# Patient Record
Sex: Female | Born: 1950 | Race: White | Hispanic: No | Smoking: Former smoker
Health system: Southern US, Community
[De-identification: ages and names within clinical notes are randomized; demographics above are authoritative.]

## PROBLEM LIST (undated history)

## (undated) DIAGNOSIS — E559 Vitamin D deficiency, unspecified: Secondary | ICD-10-CM

## (undated) DIAGNOSIS — K589 Irritable bowel syndrome without diarrhea: Secondary | ICD-10-CM

## (undated) DIAGNOSIS — F419 Anxiety disorder, unspecified: Secondary | ICD-10-CM

## (undated) DIAGNOSIS — E538 Deficiency of other specified B group vitamins: Secondary | ICD-10-CM

## (undated) DIAGNOSIS — E7800 Pure hypercholesterolemia, unspecified: Secondary | ICD-10-CM

## (undated) DIAGNOSIS — Z8249 Family history of ischemic heart disease and other diseases of the circulatory system: Secondary | ICD-10-CM

## (undated) DIAGNOSIS — I10 Essential (primary) hypertension: Secondary | ICD-10-CM

## (undated) HISTORY — DX: Anxiety disorder, unspecified: F41.9

## (undated) HISTORY — PX: AUGMENTATION MAMMAPLASTY: SUR837

## (undated) HISTORY — DX: Deficiency of other specified B group vitamins: E53.8

## (undated) HISTORY — DX: Essential (primary) hypertension: I10

## (undated) HISTORY — DX: Irritable bowel syndrome, unspecified: K58.9

## (undated) HISTORY — PX: TUBAL LIGATION: SHX77

## (undated) HISTORY — DX: Vitamin D deficiency, unspecified: E55.9

---
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
## 2000-03-10 ENCOUNTER — Emergency Department (HOSPITAL_COMMUNITY): Admission: EM | Admit: 2000-03-10 | Discharge: 2000-03-10 | Payer: Self-pay | Admitting: *Deleted

## 2000-03-15 ENCOUNTER — Other Ambulatory Visit: Admission: RE | Admit: 2000-03-15 | Discharge: 2000-03-15 | Payer: Self-pay | Admitting: Gastroenterology

## 2000-03-15 ENCOUNTER — Encounter (INDEPENDENT_AMBULATORY_CARE_PROVIDER_SITE_OTHER): Payer: Self-pay | Admitting: Specialist

## 2000-04-20 ENCOUNTER — Other Ambulatory Visit: Admission: RE | Admit: 2000-04-20 | Discharge: 2000-04-20 | Payer: Self-pay | Admitting: Internal Medicine

## 2001-06-29 ENCOUNTER — Other Ambulatory Visit: Admission: RE | Admit: 2001-06-29 | Discharge: 2001-06-29 | Payer: Self-pay | Admitting: Obstetrics & Gynecology

## 2002-04-08 ENCOUNTER — Encounter: Admission: RE | Admit: 2002-04-08 | Discharge: 2002-04-08 | Payer: Self-pay | Admitting: Occupational Medicine

## 2002-04-08 ENCOUNTER — Encounter: Payer: Self-pay | Admitting: Occupational Medicine

## 2004-08-25 ENCOUNTER — Ambulatory Visit (HOSPITAL_COMMUNITY): Admission: RE | Admit: 2004-08-25 | Discharge: 2004-08-25 | Payer: Self-pay | Admitting: Internal Medicine

## 2009-03-05 LAB — HM COLONOSCOPY

## 2009-05-24 ENCOUNTER — Emergency Department (HOSPITAL_COMMUNITY): Admission: EM | Admit: 2009-05-24 | Discharge: 2009-05-24 | Payer: Self-pay | Admitting: Emergency Medicine

## 2009-06-01 ENCOUNTER — Telehealth (INDEPENDENT_AMBULATORY_CARE_PROVIDER_SITE_OTHER): Payer: Self-pay | Admitting: *Deleted

## 2009-06-02 ENCOUNTER — Ambulatory Visit: Payer: Self-pay

## 2009-06-02 ENCOUNTER — Encounter: Payer: Self-pay | Admitting: Cardiology

## 2010-01-12 ENCOUNTER — Encounter (INDEPENDENT_AMBULATORY_CARE_PROVIDER_SITE_OTHER): Payer: Self-pay | Admitting: *Deleted

## 2010-03-17 ENCOUNTER — Encounter (INDEPENDENT_AMBULATORY_CARE_PROVIDER_SITE_OTHER): Payer: Self-pay

## 2010-03-19 ENCOUNTER — Ambulatory Visit: Payer: Self-pay | Admitting: Gastroenterology

## 2010-03-30 ENCOUNTER — Ambulatory Visit: Payer: Self-pay | Admitting: Gastroenterology

## 2010-03-30 HISTORY — PX: COLONOSCOPY: SHX174

## 2010-04-01 ENCOUNTER — Encounter: Payer: Self-pay | Admitting: Gastroenterology

## 2010-10-05 NOTE — Letter (Signed)
Summary: Previsit letter  [facility]  [address] [place], [place] [address]   Phone: [phone]  Fax: [phone]       01/12/2010 MRN: [id number]  Brittany Hanna [address] [place], [place]  [address]  [name],  Welcome to the Gastroenterology Division at [facility].    You are scheduled to see a nurse for your pre-procedure visit on 02-26-10 at 4:30a.m. on the 3rd floor at [facility], [address]  We ask that you try to arrive at our office 15 minutes prior to your appointment time to allow for check-in.  Your nurse visit will consist of discussing your medical and surgical history, your immediate family medical history, and your medications.    Please bring a complete list of all your medications or, if you prefer, bring the medication bottles and we will list them.  We will need to be aware of both prescribed and over the counter drugs.  We will need to know exact dosage information as well.  If you are on blood thinners (Coumadin, Plavix, Aggrenox, Ticlid, etc.) please call our office today/prior to your appointment, as we need to consult with your physician about holding your medication.   Please be prepared to read and sign documents such as consent forms, a financial agreement, and acknowledgement forms.  If necessary, and with your consent, a friend or relative is welcome to sit-in on the nurse visit with you.  Please bring your insurance card so that we may make a copy of it.  If your insurance requires a referral to see a specialist, please bring your referral form from your primary care physician.  No co-pay is required for this nurse visit.     If you cannot keep your appointment, please call [phone] to cancel or reschedule prior to your appointment date.  This allows [place] the opportunity to schedule an appointment for another patient in need of care.    Thank you for choosing [facility] for your medical  needs.  We appreciate the opportunity to care for you.  Please visit [place] at our website  to learn more about our practice.                     Sincerely.                                                                                                                   The Gastroenterology Division

## 2010-10-05 NOTE — Letter (Signed)
Summary: Patient Notice-Hyperplastic Polyps  [facility]  [address] [place], [place] [address]   Phone: [phone]  Fax: [phone]        April 01, 2010 MRN: [id number]    [facility] [address] [place], [place]  [address]    [name],  I am pleased to inform you that the colon polyp(s) removed during your recent colonoscopy was (were) found to be hyperplastic. These types of polyps are NOT pre-cancerous.  It is my recommendation that you have a repeat colonoscopy examination in 10 years for routine colorectal cancer screening.  Should you develop new or worsening symptoms of abdominal pain, bowel habit changes or bleeding from the rectum or bowels, please schedule an evaluation with either your primary care physician or with me.  Continue treatment plan as outlined the day of your exam.  Please call us if you are having persistent problems or have questions about your condition that have not been fully answered at this time.  Sincerely,  Meryl Dare MD [facility]  This letter has been electronically signed by your physician.  Appended Document: Patient Notice-Hyperplastic Polyps letter mailed 8.1.2011

## 2010-10-05 NOTE — Letter (Signed)
Summary: [facility] Instructions  [facility]  [address] [place], [place] [address]   Phone: [phone]  Fax: [phone]       Brittany Hanna    09-May-1959    MRN: [id number]        Procedure Day Dorna Bloom:  Brittany Hanna  03/30/10     Arrival Time:  9:30AM     Procedure Time:  10:30AM     Location of Procedure:                    _ X_  [facility] (4th Floor)                        PREPARATION FOR COLONOSCOPY WITH MOVIPREP   Starting 5 days prior to your procedure 03/25/10 do not eat nuts, seeds, popcorn, corn, beans, peas,  salads, or any raw vegetables.  Do not take any fiber supplements (e.g. Metamucil, Citrucel, and Benefiber).  THE DAY BEFORE YOUR PROCEDURE         DATE: 03/29/10  DAY: MONDAY  1.  Drink clear liquids the entire day-NO SOLID FOOD  2.  Do not drink anything colored red or purple.  Avoid juices with pulp.  No orange juice.  3.  Drink at least 64 oz. (8 glasses) of fluid/clear liquids during the day to prevent dehydration and help the prep work efficiently.  CLEAR LIQUIDS INCLUDE: Water Jello Ice Popsicles Tea (sugar ok, no milk/cream) Powdered fruit flavored drinks Coffee (sugar ok, no milk/cream) Gatorade Juice: apple, white grape, white cranberry  Lemonade Clear bullion, consomm, broth Carbonated beverages (any kind) Strained chicken noodle soup Hard Candy                             4.  In the morning, mix first dose of MoviPrep solution:    Empty 1 Pouch A and 1 Pouch B into the disposable container    Add lukewarm drinking water to the top line of the container. Mix to dissolve    Refrigerate (mixed solution should be used within 24 hrs)  5.  Begin drinking the prep at 5:00 p.m. The MoviPrep container is divided by 4 marks.   Every 15 minutes drink the solution down to the next mark (approximately 8 oz) until the full liter is complete.   6.  Follow completed prep with 16 oz of clear liquid of your choice  (Nothing red or purple).  Continue to drink clear liquids until bedtime.  7.  Before going to bed, mix second dose of MoviPrep solution:    Empty 1 Pouch A and 1 Pouch B into the disposable container    Add lukewarm drinking water to the top line of the container. Mix to dissolve    Refrigerate  THE DAY OF YOUR PROCEDURE      DATE: 03/30/10  DAY: TUESDAY  Beginning at 5:30AM (5 hours before procedure):         1. Every 15 minutes, drink the solution down to the next mark (approx 8 oz) until the full liter is complete.  2. Follow completed prep with 16 oz. of clear liquid of your choice.    3. You may drink clear liquids until 8:30AM (2 HOURS BEFORE PROCEDURE).   MEDICATION INSTRUCTIONS  Unless otherwise instructed, you should take regular prescription medications with a small sip of water   as early as possible the morning  of your procedure.         OTHER INSTRUCTIONS  You will need a responsible adult at least 60 years of age to accompany you and drive you home.   This person must remain in the waiting room during your procedure.  Wear loose fitting clothing that is easily removed.  Leave jewelry and other valuables at home.  However, you may wish to bring a book to read or  an iPod/MP3 player to listen to music as you wait for your procedure to start.  Remove all body piercing jewelry and leave at home.  Total time from sign-in until discharge is approximately 2-3 hours.  You should go home directly after your procedure and rest.  You can resume normal activities the  day after your procedure.  The day of your procedure you should not:   Drive   Make legal decisions   Operate machinery   Drink alcohol   Return to work  You will receive specific instructions about eating, activities and medications before you leave.    The above instructions have been reviewed and explained to me by   Brittany Rias RN  March 19, 2010 4:59 PM     I fully understand and  can verbalize these instructions _____________________________ Date _________

## 2010-10-05 NOTE — Procedures (Signed)
Summary: Colonoscopy  Patient: Brittany Hanna Note: All result statuses are Final unless otherwise noted.  Tests: (1) Colonoscopy (COL)   COL Colonoscopy           DONE     [facility]     [address]     [place], [place]  [address]           COLONOSCOPY PROCEDURE REPORT           PATIENT:  Brittany Hanna, Brittany Hanna  MR#:  [id number]     BIRTHDATE:  Feb 03, 1951, 58 yrs. old  GENDER:  female     ENDOSCOPIST:  Judie Petit T. Russella Dar, MD, [facility]           PROCEDURE DATE:  03/30/2010     PROCEDURE:  Colonoscopy with snare polypectomy     ASA CLASS:  Class II     INDICATIONS:  1) Routine Risk Screening     MEDICATIONS:   Fentanyl 100 mcg IV, Versed 10 mg IV     DESCRIPTION OF PROCEDURE:   After the risks benefits and     alternatives of the procedure were thoroughly explained, informed     consent was obtained.  Digital rectal exam was performed and     revealed no abnormalities.   The LB [id number]-[id number] endoscope     was introduced through the anus and advanced to the cecum, which     was identified by both the appendix and ileocecal valve, without     limitations.  The quality of the prep was good, using MoviPrep.     The instrument was then slowly withdrawn as the colon was fully     examined.     <<PROCEDUREIMAGES>>     FINDINGS:  Three polyps were found in the sigmoid colon. They were     4-5 mm in size. Polyps were snared without cautery. Retrieval was     successful. A normal appearing cecum, ileocecal valve, and     appendiceal orifice were identified. The ascending, hepatic     flexure, transverse, splenic flexure, descending colon, and rectum     appeared unremarkable.  Retroflexed views in the rectum revealed     no abnormalities.  The time to cecum =  4.75  minutes. The scope     was then withdrawn (time =  13.75  min) from the patient and the     procedure completed.           COMPLICATIONS:  None           ENDOSCOPIC IMPRESSION:     1) 4 - 5 mm, three polyps in the  sigmoid colon           RECOMMENDATIONS:     1) Await pathology results     2) If the polyps removed today are adenomatous (pre-cancerous),     you will need a repeat colonoscopy in 5 years. Otherwise you     should continue to follow colorectal cancer screening guidelines     for "routine risk" patients with colonoscopy in 10 years.     Venita Lick. Russella Dar, MD, [facility]           CC: Lucky Cowboy, MD           n.     Rosalie DoctorVenita Lick. Rhyen Mazariego at 03/30/2010 11:36 AM           Bufford Buttner, [id number]  Note: An exclamation mark (!) indicates a result that  was not dispersed into the flowsheet. Document Creation Date: 03/30/2010 11:36 AM _______________________________________________________________________  (1) Order result status: Final Collection or observation date-time: 03/30/2010 11:32 Requested date-time:  Receipt date-time:  Reported date-time:  Referring Physician:   Ordering Physician: Claudette Head [phone]) Specimen Source:  Source: Launa Grill Order Number: [id number] Lab site:   Appended Document: Colonoscopy     Procedures Next Due Date:    Colonoscopy: 03/2020

## 2010-10-05 NOTE — Miscellaneous (Signed)
Summary: Lec previsit  Clinical Lists Changes  Medications: Added new medication of MOVIPREP 100 GM  SOLR (PEG-KCL-NACL-NASULF-NA ASC-C) As per prep instructions. - Signed Rx of MOVIPREP 100 GM  SOLR (PEG-KCL-NACL-NASULF-NA ASC-C) As per prep instructions.;  #1 x 0;  Signed;  Entered by: Ulis Rias RN;  Authorized by: Meryl Dare MD [facility];  Method used: Electron[facility] to [facility]  [address], [place], [place]  [address], Ph: [phone] or [phone], Fax: [phone] Observations: Added new observation of NKA: T (03/19/2010 16:17)    Prescriptions: MOVIPREP 100 GM  SOLR (PEG-KCL-NACL-NASULF-NA ASC-C) As per prep instructions.  #1 x 0   Entered by:   Ulis Rias RN   Authorized by:   Meryl Dare MD [facility]   Signed by:   Ulis Rias RN on 03/19/2010   Method used:   [facility]        [facility]  [address] (retail)       [address]       [place], [place]  [address]       Ph: [phone] or [phone]       Fax: [phone]   RxID:   [id number]

## 2010-12-10 LAB — POCT CARDIAC MARKERS
CKMB, poc: 1 ng/mL (ref 1.0–8.0)
CKMB, poc: 1.6 ng/mL (ref 1.0–8.0)
Myoglobin, poc: 48.2 ng/mL (ref 12–200)
Myoglobin, poc: 70.5 ng/mL (ref 12–200)
Troponin i, poc: <0.05 ng/mL (ref 0.00–0.09)
Troponin i, poc: <0.05 ng/mL (ref 0.00–0.09)

## 2010-12-10 LAB — POCT I-STAT, CHEM 8
BUN: 7 mg/dL (ref 6–23)
Calcium, Ion: 1.17 mmol/L (ref 1.12–1.32)
Chloride: 104 mEq/L (ref 96–112)
Creatinine, Ser: 0.7 mg/dL (ref 0.4–1.2)
Glucose, Bld: 91 mg/dL (ref 70–99)
HCT: 41 % (ref 36.0–46.0)
Hemoglobin: 13.9 g/dL (ref 12.0–15.0)
Potassium: 3.5 mEq/L (ref 3.5–5.1)
Sodium: 141 mEq/L (ref 135–145)
TCO2: 27 mmol/L (ref 0–100)

## 2010-12-10 LAB — D-DIMER, QUANTITATIVE: D-Dimer, Quant: <0.22 ug/mL-FEU (ref 0.00–0.48)

## 2011-01-20 ENCOUNTER — Other Ambulatory Visit: Payer: Self-pay | Admitting: Internal Medicine

## 2011-01-20 DIAGNOSIS — N644 Mastodynia: Secondary | ICD-10-CM

## 2011-01-26 ENCOUNTER — Ambulatory Visit
Admission: RE | Admit: 2011-01-26 | Discharge: 2011-01-26 | Disposition: A | Discharge status: Home / Self Care | Source: Ambulatory Visit | Attending: Internal Medicine | Admitting: Internal Medicine

## 2011-01-26 DIAGNOSIS — N644 Mastodynia: Secondary | ICD-10-CM

## 2012-04-13 ENCOUNTER — Emergency Department (HOSPITAL_COMMUNITY)
Admission: EM | Admit: 2012-04-13 | Discharge: 2012-04-13 | Disposition: A | Discharge status: Home / Self Care | Attending: Emergency Medicine | Admitting: Emergency Medicine

## 2012-04-13 ENCOUNTER — Encounter (HOSPITAL_COMMUNITY): Payer: Self-pay | Admitting: Emergency Medicine

## 2012-04-13 DIAGNOSIS — R5381 Other malaise: Secondary | ICD-10-CM | POA: Insufficient documentation

## 2012-04-13 DIAGNOSIS — E7800 Pure hypercholesterolemia, unspecified: Secondary | ICD-10-CM | POA: Insufficient documentation

## 2012-04-13 DIAGNOSIS — Z7982 Long term (current) use of aspirin: Secondary | ICD-10-CM | POA: Insufficient documentation

## 2012-04-13 DIAGNOSIS — R531 Weakness: Secondary | ICD-10-CM

## 2012-04-13 DIAGNOSIS — F17200 Nicotine dependence, unspecified, uncomplicated: Secondary | ICD-10-CM | POA: Insufficient documentation

## 2012-04-13 HISTORY — DX: Pure hypercholesterolemia, unspecified: E78.00

## 2012-04-13 LAB — COMPREHENSIVE METABOLIC PANEL
ALT: 11 U/L (ref 0–35)
AST: 15 U/L (ref 0–37)
Albumin: 3.8 g/dL (ref 3.5–5.2)
Alkaline Phosphatase: 47 U/L (ref 39–117)
BUN: 7 mg/dL (ref 6–23)
CO2: 28 mEq/L (ref 19–32)
Calcium: 9.7 mg/dL (ref 8.4–10.5)
Chloride: 107 mEq/L (ref 96–112)
Creatinine, Ser: 0.69 mg/dL (ref 0.50–1.10)
GFR calc Af Amer: >90 mL/min (ref >90)
GFR calc non Af Amer: >90 mL/min (ref >90)
Glucose, Bld: 96 mg/dL (ref 70–99)
Potassium: 4.1 mEq/L (ref 3.5–5.1)
Sodium: 142 mEq/L (ref 135–145)
Total Bilirubin: 0.5 mg/dL (ref 0.3–1.2)
Total Protein: 6.7 g/dL (ref 6.0–8.3)

## 2012-04-13 LAB — CBC WITH DIFFERENTIAL/PLATELET
Basophils Absolute: 0 10*3/uL (ref 0.0–0.1)
Basophils Relative: 0 % (ref 0–1)
Eosinophils Absolute: 0 10*3/uL (ref 0.0–0.7)
Eosinophils Relative: 1 % (ref 0–5)
HCT: 40 % (ref 36.0–46.0)
Hemoglobin: 14.6 g/dL (ref 12.0–15.0)
Lymphocytes Relative: 15 % (ref 12–46)
Lymphs Abs: 0.8 10*3/uL (ref 0.7–4.0)
MCH: 34.4 pg — ABNORMAL HIGH (ref 26.0–34.0)
MCHC: 36.5 g/dL — ABNORMAL HIGH (ref 30.0–36.0)
MCV: 94.1 fL (ref 78.0–100.0)
Monocytes Absolute: 0.6 10*3/uL (ref 0.1–1.0)
Monocytes Relative: 10 % (ref 3–12)
Neutro Abs: 4.3 10*3/uL (ref 1.7–7.7)
Neutrophils Relative %: 75 % (ref 43–77)
Platelets: 173 10*3/uL (ref 150–400)
RBC: 4.25 MIL/uL (ref 3.87–5.11)
RDW: 12 % (ref 11.5–15.5)
WBC: 5.8 10*3/uL (ref 4.0–10.5)

## 2012-04-13 LAB — URINALYSIS, ROUTINE W REFLEX MICROSCOPIC
Bilirubin Urine: NEGATIVE
Glucose, UA: NEGATIVE mg/dL
Hgb urine dipstick: NEGATIVE
Ketones, ur: NEGATIVE mg/dL
Leukocytes, UA: NEGATIVE
Nitrite: NEGATIVE
Protein, ur: NEGATIVE mg/dL
Specific Gravity, Urine: 1.006 (ref 1.005–1.030)
Urobilinogen, UA: 0.2 mg/dL (ref 0.0–1.0)
pH: 7.5 (ref 5.0–8.0)

## 2012-04-13 LAB — TROPONIN I: Troponin I: <0.3 ng/mL (ref <0.30)

## 2012-04-13 MED ORDER — SODIUM CHLORIDE 0.9 % IV BOLUS (SEPSIS)
1000.0000 mL | Freq: Once | INTRAVENOUS | Status: AC
  Administered 2012-04-13: 1000 mL via INTRAVENOUS

## 2012-04-13 NOTE — ED Provider Notes (Signed)
History     CSN: [id number]  Arrival date & time 04/13/12  1111   First MD Initiated Contact with Patient 04/13/12 1140      Chief Complaint  Patient presents with  . Weakness    (Consider location/radiation/quality/duration/timing/severity/associated sxs/prior treatment) HPI Patient driving and felt light headed and shaky and stopped and got oj and muffin.  Patient called doctor's office but felt worse and felt like she was going to pass out and pulled over and called 911.  Patient became nauseated and some mid back pain but no abdominal pain.  Pain gone before ems arrived.  No vomiting or diarrhea.  Patient denies chest pain, but states she had some fullness in midchest and epigastrium.  Denies any similar symptoms in past.  No headache, sob, cough.  Patient did not have breakfast before this.  PMD Oneta Rack Past Medical History  Diagnosis Date  . High cholesterol     Past Surgical History  Procedure Date  . Tubal ligation     Family History  Problem Relation Age of Onset  . Heart failure Mother   . Osteoarthritis Father   . Hypertension Father     History  Substance Use Topics  . Smoking status: Current Some Day Smoker -- 1.0 packs/day    Types: Cigarettes  . Smokeless tobacco: Not on file  . Alcohol Use: Yes     occasionally    OB History    Grav Para Term Preterm Abortions TAB SAB Ect Mult Living                  Review of Systems  All other systems reviewed and are negative.    Allergies  Review of patient's allergies indicates no known allergies.  Home Medications   Current Outpatient Rx  Name Route Sig Dispense Refill  . ASPIRIN EC 81 MG PO TBEC Oral Take 81 mg by mouth daily.    Marland Kitchen CALCIUM PO Oral Take 1 tablet by mouth 3 (three) times a week. No specific days.    Marland Kitchen VITAMIN D PO Oral Take 5,000 Units by mouth 3 (three) times a week. No specific days.    Marland Kitchen VITAMIN B 12 PO Oral Take 1 tablet by mouth 3 (three) times a week. No specific days.    Marland Kitchen  MAGNESIUM PO Oral Take 1 tablet by mouth 3 (three) times a week. No specific days.    . ADULT MULTIVITAMIN W/MINERALS CH Oral Take 1 tablet by mouth 3 (three) times a week. No specific days.    Marland Kitchen ROSUVASTATIN CALCIUM 10 MG PO TABS Oral Take 5 mg by mouth 2 (two) times a week. Mondays and Fridays,      BP 140/64  Pulse 75  Temp 98.2 F (36.8 C) (Oral)  Resp 18  Ht 5\' 10"  (1.778 m)  Wt 145 lb (65.772 kg)  BMI 20.81 kg/m2  SpO2 100%  Physical Exam  Nursing note and vitals reviewed. Constitutional: She appears well-developed and well-nourished.  HENT:  Head: Normocephalic and atraumatic.  Eyes: Conjunctivae and EOM are normal. Pupils are equal, round, and reactive to light.  Neck: Normal range of motion. Neck supple.  Cardiovascular: Normal rate, regular rhythm, normal heart sounds and intact distal pulses.   Pulmonary/Chest: Effort normal and breath sounds normal.  Abdominal: Soft. Bowel sounds are normal.  Musculoskeletal: Normal range of motion.  Neurological: She is alert.  Skin: Skin is warm and dry.  Psychiatric: She has a normal mood and affect. Thought content  normal.    ED Course  Procedures (including critical care time)  Labs Reviewed - No data to display No results found.   No diagnosis found.   Date: 04/13/2012  Rate: 81  Rhythm: normal sinus rhythm  QRS Axis: normal  Intervals: normal  ST/T Wave abnormalities: normal  Conduction Disutrbances:none  Narrative Interpretation:   Old EKG Reviewed: unchanged Results for orders placed during the hospital encounter of 04/13/12  CBC WITH DIFFERENTIAL      Component Value Range   WBC 5.8  4.0 - 10.5 K/uL   RBC 4.25  3.87 - 5.11 MIL/uL   Hemoglobin 14.6  12.0 - 15.0 g/dL   HCT 96.0  45.4 - 09.8 %   MCV 94.1  78.0 - 100.0 fL   MCH 34.4 (*) 26.0 - 34.0 pg   MCHC 36.5 (*) 30.0 - 36.0 g/dL   RDW 11.9  14.7 - 82.9 %   Platelets 173  150 - 400 K/uL   Neutrophils Relative 75  43 - 77 %   Neutro Abs 4.3  1.7 - 7.7  K/uL   Lymphocytes Relative 15  12 - 46 %   Lymphs Abs 0.8  0.7 - 4.0 K/uL   Monocytes Relative 10  3 - 12 %   Monocytes Absolute 0.6  0.1 - 1.0 K/uL   Eosinophils Relative 1  0 - 5 %   Eosinophils Absolute 0.0  0.0 - 0.7 K/uL   Basophils Relative 0  0 - 1 %   Basophils Absolute 0.0  0.0 - 0.1 K/uL  COMPREHENSIVE METABOLIC PANEL      Component Value Range   Sodium 142  135 - 145 mEq/L   Potassium 4.1  3.5 - 5.1 mEq/L   Chloride 107  96 - 112 mEq/L   CO2 28  19 - 32 mEq/L   Glucose, Bld 96  70 - 99 mg/dL   BUN 7  6 - 23 mg/dL   Creatinine, Ser 5.62  0.50 - 1.10 mg/dL   Calcium 9.7  8.4 - 13.0 mg/dL   Total Protein 6.7  6.0 - 8.3 g/dL   Albumin 3.8  3.5 - 5.2 g/dL   AST 15  0 - 37 U/L   ALT 11  0 - 35 U/L   Alkaline Phosphatase 47  39 - 117 U/L   Total Bilirubin 0.5  0.3 - 1.2 mg/dL   GFR calc non Af Amer >90  >90 mL/min   GFR calc Af Amer >90  >90 mL/min  TROPONIN I      Component Value Range   Troponin I <0.30  <0.30 ng/mL  URINALYSIS, ROUTINE W REFLEX MICROSCOPIC      Component Value Range   Color, Urine YELLOW  YELLOW   APPearance CLEAR  CLEAR   Specific Gravity, Urine 1.006  1.005 - 1.030   pH 7.5  5.0 - 8.0   Glucose, UA NEGATIVE  NEGATIVE mg/dL   Hgb urine dipstick NEGATIVE  NEGATIVE   Bilirubin Urine NEGATIVE  NEGATIVE   Ketones, ur NEGATIVE  NEGATIVE mg/dL   Protein, ur NEGATIVE  NEGATIVE mg/dL   Urobilinogen, UA 0.2  0.0 - 1.0 mg/dL   Nitrite NEGATIVE  NEGATIVE   Leukocytes, UA NEGATIVE  NEGATIVE      MDM     Patient received IV fluids. Her orthostatic vital signs were unchanged. She has resolution of all her symptoms here. She has a normal EKG, CBC, chemistry, and cardiac markers. She is advised to  followup with her primary care Dr. tomorrow. She is advised to return or she is worse at any time.    Hilario Quarry, MD 04/13/12 [phone]

## 2012-04-13 NOTE — ED Notes (Signed)
Pt states she was driving out of town to [place] and started to feel weak and light headed. Pt states she started to have epigastric pain and back pain with nausea. Pt states she pulled over to a fast food place to get some orange juice. Pt states she called her doctor office for an appt. She then dialed 911 due to symptoms getting worse. Pt denies pain at this time.

## 2012-04-13 NOTE — ED Notes (Signed)
Patient is resting comfortably. 

## 2013-07-29 ENCOUNTER — Ambulatory Visit: Payer: Self-pay | Admitting: Internal Medicine

## 2013-08-07 ENCOUNTER — Encounter: Payer: Self-pay | Admitting: Internal Medicine

## 2013-08-08 ENCOUNTER — Ambulatory Visit (INDEPENDENT_AMBULATORY_CARE_PROVIDER_SITE_OTHER): Admitting: Physician Assistant

## 2013-08-08 ENCOUNTER — Encounter: Payer: Self-pay | Admitting: Internal Medicine

## 2013-08-08 VITALS — BP 120/72 | HR 60 | Temp 98.0°F | Resp 16 | Ht 70.0 in | Wt 167.0 lb

## 2013-08-08 DIAGNOSIS — I10 Essential (primary) hypertension: Secondary | ICD-10-CM | POA: Insufficient documentation

## 2013-08-08 DIAGNOSIS — Z79899 Other long term (current) drug therapy: Secondary | ICD-10-CM

## 2013-08-08 DIAGNOSIS — R7309 Other abnormal glucose: Secondary | ICD-10-CM | POA: Insufficient documentation

## 2013-08-08 DIAGNOSIS — E782 Mixed hyperlipidemia: Secondary | ICD-10-CM

## 2013-08-08 DIAGNOSIS — E559 Vitamin D deficiency, unspecified: Secondary | ICD-10-CM

## 2013-08-08 DIAGNOSIS — N3000 Acute cystitis without hematuria: Secondary | ICD-10-CM

## 2013-08-08 LAB — CBC WITH DIFFERENTIAL/PLATELET
Basophils Absolute: 0 10*3/uL (ref 0.0–0.1)
Basophils Relative: 1 % (ref 0–1)
Eosinophils Absolute: 0.2 10*3/uL (ref 0.0–0.7)
Eosinophils Relative: 4 % (ref 0–5)
HCT: 39.3 % (ref 36.0–46.0)
Hemoglobin: 14 g/dL (ref 12.0–15.0)
Lymphocytes Relative: 39 % (ref 12–46)
Lymphs Abs: 1.6 10*3/uL (ref 0.7–4.0)
MCH: 33.7 pg (ref 26.0–34.0)
MCHC: 35.6 g/dL (ref 30.0–36.0)
MCV: 94.7 fL (ref 78.0–100.0)
Monocytes Absolute: 0.5 10*3/uL (ref 0.1–1.0)
Monocytes Relative: 12 % (ref 3–12)
Neutro Abs: 1.8 10*3/uL (ref 1.7–7.7)
Neutrophils Relative %: 44 % (ref 43–77)
Platelets: 230 10*3/uL (ref 150–400)
RBC: 4.15 MIL/uL (ref 3.87–5.11)
RDW: 12.7 % (ref 11.5–15.5)
WBC: 4.1 10*3/uL (ref 4.0–10.5)

## 2013-08-08 LAB — HEMOGLOBIN A1C
Hgb A1c MFr Bld: 5.2 % (ref <5.7)
Mean Plasma Glucose: 103 mg/dL (ref <117)

## 2013-08-08 MED ORDER — AZITHROMYCIN 250 MG PO TABS: ORAL_TABLET | ORAL | Status: DC

## 2013-08-08 MED ORDER — VARENICLINE TARTRATE 1 MG PO TABS: 1.0000 mg | ORAL_TABLET | Freq: Two times a day (BID) | ORAL | Status: DC

## 2013-08-08 NOTE — Patient Instructions (Signed)
Chantix- please start 1/2 a pill for 3 days, then do 1/2 a pill two times daily for 4 days.  At this point you can continue on the 1/2 a pill twice a day or increase to 1 pill in the AM and 1/2 in the PM or go to 1 pill twice a day.  If you feel any depression please stop and call the office.   Smoking Cessation Quitting smoking is important to your health and has many advantages. However, it is not always easy to quit since nicotine is a very addictive drug. Often times, people try 3 times or more before being able to quit. This document explains the best ways for you to prepare to quit smoking. Quitting takes hard work and a lot of effort, but you can do it. ADVANTAGES OF QUITTING SMOKING  You will live longer, feel better, and live better.  Your body will feel the impact of quitting smoking almost immediately.  Within 20 minutes, blood pressure decreases. Your pulse returns to its normal level.  After 8 hours, carbon monoxide levels in the blood return to normal. Your oxygen level increases.  After 24 hours, the chance of having a heart attack starts to decrease. Your breath, hair, and body stop smelling like smoke.  After 48 hours, damaged nerve endings begin to recover. Your sense of taste and smell improve.  After 72 hours, the body is virtually free of nicotine. Your bronchial tubes relax and breathing becomes easier.  After 2 to 12 weeks, lungs can hold more air. Exercise becomes easier and circulation improves.  The risk of having a heart attack, stroke, cancer, or lung disease is greatly reduced.  After 1 year, the risk of coronary heart disease is cut in half.  After 5 years, the risk of stroke falls to the same as a nonsmoker.  After 10 years, the risk of lung cancer is cut in half and the risk of other cancers decreases significantly.  After 15 years, the risk of coronary heart disease drops, usually to the level of a nonsmoker.  If you are pregnant, quitting smoking  will improve your chances of having a healthy baby.  The people you live with, especially any children, will be healthier.  You will have extra money to spend on things other than cigarettes. QUESTIONS TO THINK ABOUT BEFORE ATTEMPTING TO QUIT You may want to talk about your answers with your caregiver.  Why do you want to quit?  If you tried to quit in the past, what helped and what did not?  What will be the most difficult situations for you after you quit? How will you plan to handle them?  Who can help you through the tough times? Your family? Friends? A caregiver?  What pleasures do you get from smoking? What ways can you still get pleasure if you quit? Here are some questions to ask your caregiver:  How can you help me to be successful at quitting?  What medicine do you think would be best for me and how should I take it?  What should I do if I need more help?  What is smoking withdrawal like? How can I get information on withdrawal? GET READY  Set a quit date.  Change your environment by getting rid of all cigarettes, ashtrays, matches, and lighters in your home, car, or work. Do not let people smoke in your home.  Review your past attempts to quit. Think about what worked and what did not. GET  SUPPORT AND ENCOURAGEMENT You have a better chance of being successful if you have help. You can get support in many ways.  Tell your family, friends, and co-workers that you are going to quit and need their support. Ask them not to smoke around you.  Get individual, group, or telephone counseling and support. Programs are available at [employer] and health centers. Call your local health department for information about programs in your area.  Spiritual beliefs and practices may help some smokers quit.  Download a "quit meter" on your computer to keep track of quit statistics, such as how long you have gone without smoking, cigarettes not smoked, and money saved.  Get a  self-help book about quitting smoking and staying off of tobacco. LEARN NEW SKILLS AND BEHAVIORS  Distract yourself from urges to smoke. Talk to someone, go for a walk, or occupy your time with a task.  Change your normal routine. Take a different route to work. Drink tea instead of coffee. Eat breakfast in a different place.  Reduce your stress. Take a hot bath, exercise, or read a book.  Plan something enjoyable to do every day. Reward yourself for not smoking.  Explore interactive web-based programs that specialize in helping you quit. GET MEDICINE AND USE IT CORRECTLY Medicines can help you stop smoking and decrease the urge to smoke. Combining medicine with the above behavioral methods and support can greatly increase your chances of successfully quitting smoking.  Nicotine replacement therapy helps deliver nicotine to your body without the negative effects and risks of smoking. Nicotine replacement therapy includes nicotine gum, lozenges, inhalers, nasal sprays, and skin patches. Some may be available over-the-counter and others require a prescription.  Antidepressant medicine helps people abstain from smoking, but how this works is unknown. This medicine is available by prescription.  Nicotinic receptor partial agonist medicine simulates the effect of nicotine in your brain. This medicine is available by prescription. Ask your caregiver for advice about which medicines to use and how to use them based on your health history. Your caregiver will tell you what side effects to look out for if you choose to be on a medicine or therapy. Carefully read the information on the package. Do not use any other product containing nicotine while using a nicotine replacement product.  RELAPSE OR DIFFICULT SITUATIONS Most relapses occur within the first 3 months after quitting. Do not be discouraged if you start smoking again. Remember, most people try several times before finally quitting. You may have  symptoms of withdrawal because your body is used to nicotine. You may crave cigarettes, be irritable, feel very hungry, cough often, get headaches, or have difficulty concentrating. The withdrawal symptoms are only temporary. They are strongest when you first quit, but they will go away within 10 14 days. To reduce the chances of relapse, try to:  Avoid drinking alcohol. Drinking lowers your chances of successfully quitting.  Reduce the amount of caffeine you consume. Once you quit smoking, the amount of caffeine in your body increases and can give you symptoms, such as a rapid heartbeat, sweating, and anxiety.  Avoid smokers because they can make you want to smoke.  Do not let weight gain distract you. Many smokers will gain weight when they quit, usually less than 10 pounds. Eat a healthy diet and stay active. You can always lose the weight gained after you quit.  Find ways to improve your mood other than smoking. FOR MORE INFORMATION  www.smokefree.gov  Document Released: 08/16/2001  Document Revised: 02/21/2012 Document Reviewed: 12/01/2011 [facility] Patient Information 2014 [place], [place].  Varicose Veins Varicose veins are veins that have become enlarged and twisted. CAUSES This condition is the result of valves in the veins not working properly. Valves in the veins help return blood from the leg to the heart. When your calf muscles squeeze, the blood moves up your leg then the valves close and this continues until the blood gets back to your heart.  If these valves are damaged, blood flows backwards and backs up into the veins in the leg near the skin OR if your are sitting/standing for a long time without using your calf muscles the blood will back up into the veins in your legs. This causes the veins to become larger. People who are on their feet a lot, sit a lot without walking (like on a plane, at a desk, or in a car), who are pregnant, or who are overweight are more likely to develop  varicose veins. SYMPTOMS   Bulging, twisted-appearing, bluish veins, most commonly found on the legs.  Leg pain or a feeling of heaviness. These symptoms may be worse at the end of the day.  Leg swelling.  Skin color changes. DIAGNOSIS  Varicose veins can usually be diagnosed with an exam of your legs by your caregiver. He or she may recommend an ultrasound of your leg veins. TREATMENT  Most varicose veins can be treated at home.However, other treatments are available for people who have persistent symptoms or who want to treat the cosmetic appearance of the varicose veins. But this is only cosmetic and they will return if not properly treated. These include:  Laser treatment of very small varicose veins.  Medicine that is shot (injected) into the vein. This medicine hardens the walls of the vein and closes off the vein. This treatment is called sclerotherapy. Afterwards, you may need to wear clothing or bandages that apply pressure.  Surgery. HOME CARE INSTRUCTIONS   Do not stand or sit in one position for long periods of time. Do not sit with your legs crossed. Rest with your legs raised during the day.  Your legs have to be higher than your heart so that gravity will force the valves to open, so please really elevate your legs.   Wear elastic stockings or support hose. Do not wear other tight, encircling garments around the legs, pelvis, or waist.  [employer]  has a wide variety of well priced compression stockings. [address] [place], [place] [address] [phone]  Walk as much as possible to increase blood flow.  Raise the foot of your bed at night with 2-inch blocks.  If you get a cut in the skin over the vein and the vein bleeds, lie down with your leg raised and press on it with a clean cloth until the bleeding stops. Then place a bandage (dressing) on the cut. See your caregiver if it continues to bleed or needs stitches. SEEK MEDICAL CARE IF:   The skin  around your ankle starts to break down.  You have pain, redness, tenderness, or hard swelling developing in your leg over a vein.  You are uncomfortable due to leg pain. Document Released: 06/01/2005 Document Revised: 11/14/2011 Document Reviewed: 10/18/2010 [facility] Patient Information 2014 [place], [place].

## 2013-08-08 NOTE — Progress Notes (Signed)
Patient ID: Brittany Hanna, female   DOB: 1950-09-12, 62 y.o.   MRN: [id number]  This very nice 62 yo WWF presents for 3 month follow up with Hypertension, Hyperlipidemia, pre-diabetes and Vitamin D deficiency.  BP has been controlled at home. Patient denies any cardiac type chest pain, palpitations, dyspnea/orthopnea/PND, dizziness, claudication, or dependent edema. Hyperlipidemia is controlled with diet & meds.  Patient denies myalgias or other med SE's.  Also, the patient has history of prediabetes/insulin resistance Patient denies any symptoms of reactive hypoglycemia, diabetic polys, paresthesias or visual blurring.  Patient supplements vitamin D without any suspected side-effects. Patient is a smoker and would like to quit, has tried chantix in the past.  Patient states for the past 6 months she has had some stress incontinence and she has sudden urge to go to the bathroom.   Current Outpatient Prescriptions on File Prior to Visit  Medication Sig Dispense Refill  . aspirin EC 81 MG tablet Take 81 mg by mouth daily.      Marland Kitchen CALCIUM PO Take 1 tablet by mouth 3 (three) times a week. No specific days.      . Cholecalciferol (VITAMIN D PO) Take 5,000 Units by mouth 3 (three) times a week. No specific days.      . Cyanocobalamin (VITAMIN B 12 PO) Take 1 tablet by mouth 3 (three) times a week. No specific days.      Marland Kitchen MAGNESIUM PO Take 1 tablet by mouth 3 (three) times a week. No specific days.      . Multiple Vitamin (MULTIVITAMIN WITH MINERALS) TABS Take 1 tablet by mouth 3 (three) times a week. No specific days.       No current facility-administered medications on file prior to visit.     Allergies  Allergen Reactions  . Lipitor [Atorvastatin] Other (See Comments)    Fatigue.    PMHx:   Past Medical History  Diagnosis Date  . High cholesterol   . Hypertension   . IBS (irritable bowel syndrome)   . B12 deficiency   . Vitamin D deficiency     FHx:    Reviewed / unchanged  SHx:     Reviewed / unchanged  Systems Review: Constitutional: Denies fever, chills, wt changes, headaches, insomnia, fatigue, night sweats, change in appetite. Eyes: Denies redness, blurred vision, diplopia, discharge, itchy, watery eyes.  ENT: + congestion, sinus pain Denies discharge, post nasal drip, epistaxis, sore throat, earache, hearing loss, dental pain, tinnitus, vertigo, snoring.  CV: Denies chest pain, palpitations, irregular heartbeat, syncope, dyspnea, diaphoresis, orthopnea, PND, claudication, edema. Respiratory: denies cough, dyspnea, DOE, pleurisy, hoarseness, laryngitis, wheezing.  Gastrointestinal: Denies dysphagia, odynophagia, heartburn, reflux, water brash, abdominal pain or cramps, nausea, vomiting, bloating, diarrhea, constipation, hematemesis, melena, hematochezia,  or hemorrhoids. Genitourinary: + urgency and incontinence Denies dysuria, frequency, nocturia, hesitancy, discharge, hematuria, flank pain. Musculoskeletal: Denies arthralgias, myalgias, stiffness, jt. swelling, pain, limp, strain/sprain.  Skin: Denies pruritus, rash, hives, warts, acne, eczema, change in skin lesion(s). Neuro: No weakness, tremor, incoordination, spasms, paresthesia, or pain. Psychiatric: Denies confusion, memory loss, or sensory loss. Endo: Denies change in weight, skin, hair change.  Heme/Lymph: No excessive bleeding, bruising, orenlarged lymph nodes.  Filed Vitals:   08/08/13 1655  BP: 120/72  Pulse: 60  Temp: 98 F (36.7 C)  Resp: 16    Estimated body mass index is 23.96 kg/(m^2) as calculated from the following:   Height as of this encounter: 5\' 10"  (1.778 m).   Weight as of this encounter:  167 lb (75.751 kg).  On Exam: Appears well nourished - in no distress. Eyes: PERRLA, EOMs, conjunctiva no swelling or erythema. Sinuses: No frontal/maxillary tenderness ENT/Mouth: EAC's clear, TM's nl w/o erythema, bulging. Nares clear w/o erythema, swelling, exudates. Oropharynx clear without  erythema or exudates. Oral hygiene is good. Tongue normal, non obstructing. Hearing intact.  Neck: Supple. Thyroid nl. Car 2+/2+ without bruits, nodes or JVD. Chest: Respirations nl with BS clear & equal w/o rales, rhonchi, wheezing or stridor.  Cor: Heart sounds normal w/ regular rate and rhythm without sig. murmurs, gallops, clicks, or rubs. Peripheral pulses normal and equal  without edema.  Abdomen: Soft & bowel sounds normal. Non-tender w/o guarding, rebound, hernias, masses, or organomegaly.  Lymphatics: Unremarkable.  Musculoskeletal: Full ROM all peripheral extremities, joint stability, 5/5 strength, and normal gait.  Skin: Warm, dry without exposed rashes, lesions, ecchymosis apparent.  Neuro: Cranial nerves intact, reflexes equal bilaterally. Sensory-motor testing grossly intact. Tendon reflexes grossly intact.  Pysch: Alert & oriented x 3. Insight and judgement nl & appropriate. No ideations.  Assessment and Plan:  1. Hypertension - Continue monitor blood pressure at home. Continue diet/meds same.  2. Hyperlipidemia - Continue diet/meds, exercise,& lifestyle modifications. Continue monitor periodic cholesterol/liver & renal functions   3. Pre-diabetes/Insulin Resistance - Continue diet, exercise, lifestyle modifications. Monitor appropriate labs.  4. Vitamin D Deficiency - Continue supplementation.  5. Patient complains of stress incontinence- will check for UTI, given bladder matters and if symptoms do not improve we will call in [facility].   6. Smoking cessation- try chantix- called in and pt given coupon  7. Sinusitis- zpak hold on to it- try allegra first  Further disposition pending results of labs.

## 2013-08-09 LAB — HEPATIC FUNCTION PANEL
ALT: 14 U/L (ref 0–35)
AST: 16 U/L (ref 0–37)
Albumin: 3.9 g/dL (ref 3.5–5.2)
Alkaline Phosphatase: 53 U/L (ref 39–117)
Bilirubin, Direct: 0.1 mg/dL (ref 0.0–0.3)
Indirect Bilirubin: 0.3 mg/dL (ref 0.0–0.9)
Total Bilirubin: 0.4 mg/dL (ref 0.3–1.2)
Total Protein: 6.4 g/dL (ref 6.0–8.3)

## 2013-08-09 LAB — URINALYSIS, ROUTINE W REFLEX MICROSCOPIC
Bilirubin Urine: NEGATIVE
Glucose, UA: NEGATIVE mg/dL
Hgb urine dipstick: NEGATIVE
Ketones, ur: NEGATIVE mg/dL
Leukocytes, UA: NEGATIVE
Nitrite: NEGATIVE
Protein, ur: NEGATIVE mg/dL
Specific Gravity, Urine: 1.007 (ref 1.005–1.030)
Urobilinogen, UA: 0.2 mg/dL (ref 0.0–1.0)
pH: 6.5 (ref 5.0–8.0)

## 2013-08-09 LAB — LIPID PANEL
Cholesterol: 204 mg/dL — ABNORMAL HIGH (ref 0–200)
HDL: 66 mg/dL (ref >39)
LDL Cholesterol: 119 mg/dL — ABNORMAL HIGH (ref 0–99)
Total CHOL/HDL Ratio: 3.1 Ratio
Triglycerides: 97 mg/dL (ref <150)
VLDL: 19 mg/dL (ref 0–40)

## 2013-08-09 LAB — BASIC METABOLIC PANEL WITH GFR
BUN: 7 mg/dL (ref 6–23)
CO2: 31 mEq/L (ref 19–32)
Calcium: 9.2 mg/dL (ref 8.4–10.5)
Chloride: 103 mEq/L (ref 96–112)
Creat: 1.15 mg/dL — ABNORMAL HIGH (ref 0.50–1.10)
GFR, Est African American: 59 mL/min — ABNORMAL LOW
GFR, Est Non African American: 51 mL/min — ABNORMAL LOW
Glucose, Bld: 78 mg/dL (ref 70–99)
Potassium: 4.3 mEq/L (ref 3.5–5.3)
Sodium: 140 mEq/L (ref 135–145)

## 2013-08-09 LAB — VITAMIN D 25 HYDROXY (VIT D DEFICIENCY, FRACTURES): Vit D, 25-Hydroxy: 56 ng/mL (ref 30–89)

## 2013-08-09 LAB — URINE CULTURE
Colony Count: NO GROWTH
Organism ID, Bacteria: NO GROWTH

## 2013-08-09 LAB — TSH: TSH: 1.02 u[IU]/mL (ref 0.350–4.500)

## 2013-08-09 LAB — MAGNESIUM: Magnesium: 2 mg/dL (ref 1.5–2.5)

## 2013-08-09 LAB — INSULIN, FASTING: Insulin fasting, serum: 7 u[IU]/mL (ref 3–28)

## 2014-01-29 ENCOUNTER — Encounter: Payer: Self-pay | Admitting: Internal Medicine

## 2014-03-19 ENCOUNTER — Ambulatory Visit (INDEPENDENT_AMBULATORY_CARE_PROVIDER_SITE_OTHER): Admitting: Internal Medicine

## 2014-03-19 ENCOUNTER — Encounter: Payer: Self-pay | Admitting: Internal Medicine

## 2014-03-19 VITALS — BP 124/62 | HR 68 | Temp 97.9°F | Resp 16 | Ht 70.0 in | Wt 168.6 lb

## 2014-03-19 DIAGNOSIS — Z79899 Other long term (current) drug therapy: Secondary | ICD-10-CM

## 2014-03-19 DIAGNOSIS — R740 Nonspecific elevation of levels of transaminase and lactic acid dehydrogenase [LDH]: Secondary | ICD-10-CM

## 2014-03-19 DIAGNOSIS — E782 Mixed hyperlipidemia: Secondary | ICD-10-CM

## 2014-03-19 DIAGNOSIS — Z113 Encounter for screening for infections with a predominantly sexual mode of transmission: Secondary | ICD-10-CM

## 2014-03-19 DIAGNOSIS — E538 Deficiency of other specified B group vitamins: Secondary | ICD-10-CM

## 2014-03-19 DIAGNOSIS — I10 Essential (primary) hypertension: Secondary | ICD-10-CM

## 2014-03-19 DIAGNOSIS — R7401 Elevation of levels of liver transaminase levels: Secondary | ICD-10-CM

## 2014-03-19 DIAGNOSIS — Z0000 Encounter for general adult medical examination without abnormal findings: Secondary | ICD-10-CM

## 2014-03-19 DIAGNOSIS — R7402 Elevation of levels of lactic acid dehydrogenase (LDH): Secondary | ICD-10-CM

## 2014-03-19 DIAGNOSIS — Z1212 Encounter for screening for malignant neoplasm of rectum: Secondary | ICD-10-CM

## 2014-03-19 DIAGNOSIS — Z111 Encounter for screening for respiratory tuberculosis: Secondary | ICD-10-CM

## 2014-03-19 DIAGNOSIS — R7309 Other abnormal glucose: Secondary | ICD-10-CM

## 2014-03-19 DIAGNOSIS — E559 Vitamin D deficiency, unspecified: Secondary | ICD-10-CM

## 2014-03-19 DIAGNOSIS — K589 Irritable bowel syndrome without diarrhea: Secondary | ICD-10-CM

## 2014-03-19 LAB — CBC WITH DIFFERENTIAL/PLATELET
Basophils Absolute: 0 10*3/uL (ref 0.0–0.1)
Basophils Relative: 1 % (ref 0–1)
Eosinophils Absolute: 0.4 10*3/uL (ref 0.0–0.7)
Eosinophils Relative: 9 % — ABNORMAL HIGH (ref 0–5)
HCT: 40.7 % (ref 36.0–46.0)
Hemoglobin: 14.4 g/dL (ref 12.0–15.0)
Lymphocytes Relative: 40 % (ref 12–46)
Lymphs Abs: 1.6 10*3/uL (ref 0.7–4.0)
MCH: 34.3 pg — ABNORMAL HIGH (ref 26.0–34.0)
MCHC: 35.4 g/dL (ref 30.0–36.0)
MCV: 96.9 fL (ref 78.0–100.0)
Monocytes Absolute: 0.4 10*3/uL (ref 0.1–1.0)
Monocytes Relative: 9 % (ref 3–12)
Neutro Abs: 1.6 10*3/uL — ABNORMAL LOW (ref 1.7–7.7)
Neutrophils Relative %: 41 % — ABNORMAL LOW (ref 43–77)
Platelets: 222 10*3/uL (ref 150–400)
RBC: 4.2 MIL/uL (ref 3.87–5.11)
RDW: 13.2 % (ref 11.5–15.5)
WBC: 4 10*3/uL (ref 4.0–10.5)

## 2014-03-19 MED ORDER — HYOSCYAMINE SULFATE 0.125 MG SL SUBL: 0.1250 mg | SUBLINGUAL_TABLET | SUBLINGUAL | Status: DC | PRN

## 2014-03-19 NOTE — Progress Notes (Signed)
Patient ID: Brittany Hanna, female   DOB: April 22, 1951, 63 y.o.   MRN: [id number]   Annual Screening Comprehensive Examination  This very nice 63 y.o.WWF presents for complete physical.  Patient has been followed for HTN, Diabetes  Prediabetes, Hyperlipidemia, and Vitamin D Deficiency.   Labile  HTN predates since 2005 And is monitored expectantly. Patient's BP has been controlled at home. Today's BP: 124/62 mmHg. Patient denies any cardiac symptoms as chest pain, palpitations, shortness of breath, dizziness or ankle swelling.   Patient's hyperlipidemia is not fully controlled with diet. Patient was intolerant to lipitor with myalgias. Last cholesterol on diet alone was Lab Results  Component Value Date   CHOL 204* 08/08/2013   HDL 66 08/08/2013   LDLCALC 119* 08/08/2013   TRIG 97 08/08/2013   CHOLHDL 3.1 08/08/2013    Patient is screened for  prediabetes and last A1c was 5.2% in Dec 2014. Patient denies reactive hypoglycemic symptoms, visual blurring, diabetic polys, or paresthesias.    Finally, patient has history of Vitamin D Deficiency of 14 in 2008 and last Vitamin D was 56 in 08/2013.   Medication Sig  . aspirin EC 81 MG tabl Take 81 mg  daily.  Marland Kitchen CALCIUM  Take 1 tablet  3  times a week.   Marland Kitchen VITAMIN D  Take 5,000 Units  3  times a week.  Marland Kitchen VITAMIN B 12  Take 1  3 times a week.   Marland Kitchen MAGNESIUM Take 1 tablet 3  times a week.   . MULTIVITAMIN W/ MINERALS Take 1 tablet  3  times a week.    Sertraline 50 mg.  I daily  . CHANTIX CONTIN MON PAK) 1 MG  Take 1 tablet  by mouth 2  times daily.   Allergies  Allergen Reactions  . Lipitor [Atorvastatin] Other (See Comments)    Fatigue.   Past Medical History  Diagnosis Date  . High cholesterol   . Hypertension   . IBS (irritable bowel syndrome)   . B12 deficiency   . Vitamin D deficiency    Past Surgical History  Procedure Laterality Date  . Tubal ligation     Family History  Problem Relation Age of Onset  . Heart failure Mother    . Osteoarthritis Father   . Hypertension Father   . COPD Father    History  Substance Use Topics  . Smoking status: Current Some Day Smoker -- 1.00 packs/day    Types: Cigarettes  . Smokeless tobacco: Not on file  . Alcohol Use: Yes     Comment: occasionally    ROS Constitutional: Denies fever, chills, weight loss/gain, headaches, insomnia, fatigue, night sweats, and change in appetite. Eyes: Denies redness, blurred vision, diplopia, discharge, itchy, watery eyes.  ENT: Denies discharge, congestion, post nasal drip, epistaxis, sore throat, earache, hearing loss, dental pain, Tinnitus, Vertigo, Sinus pain, snoring.  Cardio: Denies chest pain, palpitations, irregular heartbeat, syncope, dyspnea, diaphoresis, orthopnea, PND, claudication, edema Respiratory: denies cough, dyspnea, DOE, pleurisy, hoarseness, laryngitis, wheezing.  Gastrointestinal: Denies dysphagia, heartburn, reflux, water brash, pain, cramps, nausea, vomiting, bloating, diarrhea, constipation, hematemesis, melena, hematochezia, jaundice, hemorrhoids Genitourinary: Denies dysuria, frequency, urgency, nocturia, hesitancy, discharge, hematuria, flank pain Breast: Breast lumps, nipple discharge, bleeding.  Musculoskeletal: Denies arthralgia, myalgia, stiffness, Jt. Swelling, pain, limp, and strain/sprain. Denies falls. Skin: Denies puritis, rash, hives, warts, acne, eczema, changing in skin lesion Neuro: No weakness, tremor, incoordination, spasms, paresthesia, pain Psychiatric: Denies confusion, memory loss, sensory loss. Denies Depression. Endocrine: Denies change  in weight, skin, hair change, nocturia, and paresthesia, diabetic polys, visual blurring, hyper / hypo glycemic episodes.  Heme/Lymph: No excessive bleeding, bruising, enlarged lymph nodes.  Physical Exam  BP 124/62  Pulse 68  Temp(Src) 97.9 F (36.6 C) (Temporal)  Resp 16  Ht 5\' 10"  (1.778 m)  Wt 168 lb 9.6 oz (76.476 kg)  BMI 24.19 kg/m2  General  Appearance: Well nourished and in no apparent distress. Eyes: PERRLA, EOMs, conjunctiva no swelling or erythema, normal fundi and vessels. Sinuses: No frontal/maxillary tenderness ENT/Mouth: EACs patent / TMs  nl. Nares clear without erythema, swelling, mucoid exudates. Oral hygiene is good. No erythema, swelling, or exudate. Tongue normal, non-obstructing. Tonsils not swollen or erythematous. Hearing normal.  Neck: Supple, thyroid normal. No bruits, nodes or JVD. Respiratory: Respiratory effort normal.  BS equal and clear bilateral without rales, rhonci, wheezing or stridor. Cardio: Heart sounds are normal with regular rate and rhythm and no murmurs, rubs or gallops. Peripheral pulses are normal and equal bilaterally without edema. No aortic or femoral bruits. Chest: symmetric with normal excursions and percussion. Breasts: Symmetric, without lumps, nipple discharge, retractions, or fibrocystic changes.  Abdomen: Flat, soft, with bowl sounds. Nontender, no guarding, rebound, hernias, masses, or organomegaly.  Lymphatics: Non tender without lymphadenopathy.  Genitourinary:  Musculoskeletal: Full ROM all peripheral extremities, joint stability, 5/5 strength, and normal gait. Skin: Warm and dry without rashes, lesions, cyanosis, clubbing or  ecchymosis.  Neuro: Cranial nerves intact, reflexes equal bilaterally. Normal muscle tone, no cerebellar symptoms. Sensation intact.  Pysch: Awake and oriented X 3, normal affect, Insight and Judgment appropriate.   Assessment and Plan  1. Annual Screening Examination 2. Hypertension, screening  3. Hyperlipidemia, monitoring 4. Pre Diabetes, screening 5. Vitamin D Deficiency  Continue prudent diet as discussed, weight control, BP monitoring, regular exercise, and medications. Discussed med's effects and SE's. Screening labs and tests as requested with regular follow-up as recommended.Discussed smoking cessation and starting her RX of Chantix. Also  discussed tapering her Sertraline to d/c as she feels she no longer needs it.

## 2014-03-19 NOTE — Patient Instructions (Addendum)
Recommend the book "The END of DIETING" by Dr Baker Janus   and the book "The END of DIABETES " by Dr Excell Seltzer  At [email or web address] - get book & Audio CD's      Being diabetic has a  300% increased risk for heart attack, stroke, cancer, and alzheimer- type vascular dementia. It is very important that you work harder with diet by avoiding all foods that are white except chicken & fish. Avoid white rice (brown & wild rice is OK), white potatoes (sweetpotatoes in moderation is OK), White bread or wheat bread or anything made out of white flour like bagels, donuts, rolls, buns, biscuits, cakes, pastries, cookies, pizza crust, and pasta (made from white flour & egg whites) - vegetarian pasta or spinach or wheat pasta is OK. Multigrain breads like Arnold's or [employer], or multigrain sandwich thins or flatbreads.  Diet, exercise and weight loss can reverse and cure diabetes in the early stages.  Diet, exercise and weight loss is very important in the control and prevention of complications of diabetes which affects every system in your body, ie. Brain - dementia/stroke, eyes - glaucoma/blindness, heart - heart attack/heart failure, kidneys - dialysis, stomach - gastric paralysis, intestines - malabsorption, nerves - severe painful neuritis, circulation - gangrene & loss of a leg(s), and finally cancer and Alzheimers.    I recommend avoid fried & greasy foods,  sweets/candy, white rice (brown or wild rice or Quinoa is OK), white potatoes (sweet potatoes are OK) - anything made from white flour - bagels, doughnuts, rolls, buns, biscuits,white and wheat breads, pizza crust and traditional pasta made of white flour & egg white(vegetarian pasta or spinach or wheat pasta is OK).  Multi-grain bread is OK - like multi-grain flat bread or sandwich thins. Avoid alcohol in excess. Exercise is also important.    Eat all the vegetables you want - avoid meat, especially red meat and dairy - especially cheese.  Cheese  is the most concentrated form of trans-fats which is the worst thing to clog up our arteries. Veggie cheese is OK which can be found in the fresh produce section at [employer] or [employer] or [employer]  Preventive Care for Adults A healthy lifestyle and preventive care can promote health and wellness. Preventive health guidelines for women include the following key practices.  A routine yearly physical is a good way to check with your health care provider about your health and preventive screening. It is a chance to share any concerns and updates on your health and to receive a thorough exam.  Visit your dentist for a routine exam and preventive care every 6 months. Brush your teeth twice a day and floss once a day. Good oral hygiene prevents tooth decay and gum disease.  The frequency of eye exams is based on your age, health, family medical history, use of contact lenses, and other factors. Follow your health care provider's recommendations for frequency of eye exams.  Eat a healthy diet. Foods like vegetables, fruits, whole grains, low-fat dairy products, and lean protein foods contain the nutrients you need without too many calories. Decrease your intake of foods high in solid fats, added sugars, and salt. Eat the right amount of calories for you.Get information about a proper diet from your health care provider, if necessary.  Regular physical exercise is one of the most important things you can do for your health. Most adults should get at least 150 minutes of moderate-intensity exercise (any activity that increases  that increases your heart rate and causes you to sweat) each week. In addition, most adults need muscle-strengthening exercises on 2 or more days a week.  Maintain a healthy weight. The body mass index (BMI) is a screening tool to identify possible weight problems. It provides an estimate of body fat based on height and weight.  Your health care provider can find your BMI, and can help you achieve or maintain a healthy weight.For adults 20 years and older:  A BMI below 18.5 is considered underweight.  A BMI of 18.5 to 24.9 is normal.  A BMI of 25 to 29.9 is considered overweight.  A BMI of 30 and above is considered obese.  Maintain normal blood lipids and cholesterol levels by exercising and minimizing your intake of saturated fat. Eat a balanced diet with plenty of fruit and vegetables. Blood tests for lipids and cholesterol should begin at age 20 and be repeated every 5 years. If your lipid or cholesterol levels are high, you are over 50, or you are at high risk for heart disease, you may need your cholesterol levels checked more frequently.Ongoing high lipid and cholesterol levels should be treated with medicines if diet and exercise are not working.  If you smoke, find out from your health care provider how to quit. If you do not use tobacco, do not start.  Lung cancer screening is recommended for adults aged 55-80 years who are at high risk for developing lung cancer because of a history of smoking. A yearly low-dose CT scan of the lungs is recommended for people who have at least a 30-pack-year history of smoking and are a current smoker or have quit within the past 15 years. A pack year of smoking is smoking an average of 1 pack of cigarettes a day for 1 year (for example: 1 pack a day for 30 years or 2 packs a day for 15 years). Yearly screening should continue until the smoker has stopped smoking for at least 15 years. Yearly screening should be stopped for people who develop a health problem that would prevent them from having lung cancer treatment.  If you are pregnant, do not drink alcohol. If you are breastfeeding, be very cautious about drinking alcohol. If you are not pregnant and choose to drink alcohol, do not have more than 1 drink per day. One drink is considered to be 12 ounces (355 mL) of beer, 5  ounces (148 mL) of wine, or 1.5 ounces (44 mL) of liquor.  Avoid use of street drugs. Do not share needles with anyone. Ask for help if you need support or instructions about stopping the use of drugs.  High blood pressure causes heart disease and increases the risk of stroke. Your blood pressure should be checked at least every 1 to 2 years. Ongoing high blood pressure should be treated with medicines if weight loss and exercise do not work.  If you are 55-79 years old, ask your health care provider if you should take aspirin to prevent strokes.  Diabetes screening involves taking a blood sample to check your fasting blood sugar level. This should be done once every 3 years, after age 45, if you are within normal weight and without risk factors for diabetes. Testing should be considered at a younger age or be carried out more frequently if you are overweight and have at least 1 risk factor for diabetes.  Breast cancer screening is essential preventive care for women. You should practice "breast self-awareness." This means   normal appearance and feel of your breasts and may include breast self-examination. Any changes detected, no matter how small, should be reported to a health care provider. Women in their 77s and 30s should have a clinical breast exam (CBE) by a health care provider as part of a regular health exam every 1 to 3 years. After age 52, women should have a CBE every year. Starting at age 59, women should consider having a mammogram (breast X-ray test) every year. Women who have a family history of breast cancer should talk to their health care provider about genetic screening. Women at a high risk of breast cancer should talk to their health care providers about having an MRI and a mammogram every year.  Breast cancer gene (BRCA)-related cancer risk assessment is recommended for women who have family members with BRCA-related cancers. BRCA-related cancers include breast, ovarian, tubal, and peritoneal cancers. Having family members with  these cancers may be associated with an increased risk for harmful changes (mutations) in the breast cancer genes BRCA1 and BRCA2. Results of the assessment will determine the need for genetic counseling and BRCA1 and BRCA2 testing.  Routine pelvic exams to screen for cancer are no longer recommended for nonpregnant women who are considered low risk for cancer of the pelvic organs (ovaries, uterus, and vagina) and who do not have symptoms. Ask your health care provider if a screening pelvic exam is right for you.  If you have had past treatment for cervical cancer or a condition that could lead to cancer, you need Pap tests and screening for cancer for at least 20 years after your treatment. If Pap tests have been discontinued, your risk factors (such as having a new sexual partner) need to be reassessed to determine if screening should be resumed. Some women have medical problems that increase the chance of getting cervical cancer. In these cases, your health care provider may recommend more frequent screening and Pap tests.  The HPV test is an additional test that may be used for cervical cancer screening. The HPV test looks for the virus that can cause the cell changes on the cervix. The cells collected during the Pap test can be tested for HPV. The HPV test could be used to screen women aged 43 years and older, and should be used in women of any age who have unclear Pap test results. After the age of 50, women should have HPV testing at the same frequency as a Pap test.  Colorectal cancer can be detected and often prevented. Most routine colorectal cancer screening begins at the age of 30 years and continues through age 66 years. However, your health care provider may recommend screening at an earlier age if you have risk factors for colon cancer. On a yearly basis, your health care provider may provide home test kits to check for hidden blood in the stool. Use of a small camera at the end of a tube, to  directly examine the colon (sigmoidoscopy or colonoscopy), can detect the earliest forms of colorectal cancer. Talk to your health care provider about this at age 43, when routine screening begins. Direct exam of the colon should be repeated every 5-10 years through age 56 years, unless early forms of pre-cancerous polyps or small growths are found.  People who are at an increased risk for hepatitis B should be screened for this virus. You are considered at high risk for hepatitis B if:  You were born in a country where hepatitis B occurs  B occurs often. Talk with your health care provider about which countries are considered high risk.  Your parents were born in a high-risk country and you have not received a shot to protect against hepatitis B (hepatitis B vaccine).  You have HIV or AIDS.  You use needles to inject street drugs.  You live with, or have sex with, someone who has Hepatitis B.  You get hemodialysis treatment.  You take certain medicines for conditions like cancer, organ transplantation, and autoimmune conditions.  Hepatitis C blood testing is recommended for all people born from 1945 through 1965 and any individual with known risks for hepatitis C.  Practice safe sex. Use condoms and avoid high-risk sexual practices to reduce the spread of sexually transmitted infections (STIs). STIs include gonorrhea, chlamydia, syphilis, trichomonas, herpes, HPV, and human immunodeficiency virus (HIV). Herpes, HIV, and HPV are viral illnesses that have no cure. They can result in disability, cancer, and death.  You should be screened for sexually transmitted illnesses (STIs) including gonorrhea and chlamydia if:  You are sexually active and are younger than 24 years.  You are older than 24 years and your health care provider tells you that you are at risk for this type of infection.  Your sexual activity has changed since you  were last screened and you are at an increased risk for chlamydia or gonorrhea. Ask your health care provider if you are at risk.  If you are at risk of being infected with HIV, it is recommended that you take a prescription medicine daily to prevent HIV infection. This is called preexposure prophylaxis (PrEP). You are considered at risk if:  You are a heterosexual woman, are sexually active, and are at increased risk for HIV infection.  You take drugs by injection.  You are sexually active with a partner who has HIV.  Talk with your health care provider about whether you are at high risk of being infected with HIV. If you choose to begin PrEP, you should first be tested for HIV. You should then be tested every 3 months for as long as you are taking PrEP.  Osteoporosis is a disease in which the bones lose minerals and strength with aging. This can result in serious bone fractures or breaks. The risk of osteoporosis can be identified using a bone density scan. Women ages 65 years and over and women at risk for fractures or osteoporosis should discuss screening with their health care providers. Ask your health care provider whether you should take a calcium supplement or vitamin D to reduce the rate of osteoporosis.  Menopause can be associated with physical symptoms and risks. Hormone replacement therapy is available to decrease symptoms and risks. You should talk to your health care provider about whether hormone replacement therapy is right for you.  Use sunscreen. Apply sunscreen liberally and repeatedly throughout the day. You should seek shade when your shadow is shorter than you. Protect yourself by wearing long sleeves, pants, a wide-brimmed hat, and sunglasses year round, whenever you are outdoors.  Once a month, do a whole body skin exam, using a mirror to look at the skin on your back. Tell your health care provider of new moles, moles that have irregular borders, moles that are larger  than a pencil eraser, or moles that have changed in shape or color.  Stay current with required vaccines (immunizations).  Influenza vaccine. All adults should be immunized every year.  Tetanus, diphtheria, and acellular pertussis (Td, Tdap) vaccine. Pregnant women   1 dose of Tdap vaccine during each pregnancy. The dose should be obtained regardless of the length of time since the last dose. Immunization is preferred during the 27th-36th week of gestation. An adult who has not previously received Tdap or who does not know her vaccine status should receive 1 dose of Tdap. This initial dose should be followed by tetanus and diphtheria toxoids (Td) booster doses every 10 years. Adults with an unknown or incomplete history of completing a 3-dose immunization series with Td-containing vaccines should begin or complete a primary immunization series including a Tdap dose. Adults should receive a Td booster every 10 years.  Varicella vaccine. An adult without evidence of immunity to varicella should receive 2 doses or a second dose if she has previously received 1 dose. Pregnant females who do not have evidence of immunity should receive the first dose after pregnancy. This first dose should be obtained before leaving the health care facility. The second dose should be obtained 4-8 weeks after the first dose.  Human papillomavirus (HPV) vaccine. Females aged 13-26 years who have not received the vaccine previously should obtain the 3-dose series. The vaccine is not recommended for use in pregnant females. However, pregnancy testing is not needed before receiving a dose. If a female is found to be pregnant after receiving a dose, no treatment is needed. In that case, the remaining doses should be delayed until after the pregnancy. Immunization is recommended for any person with an immunocompromised condition through the age of 32 years if she did not get any or all doses earlier. During the 3-dose series, the second dose should be obtained 4-8 weeks after the  first dose. The third dose should be obtained 24 weeks after the first dose and 16 weeks after the second dose.  Zoster vaccine. One dose is recommended for adults aged 26 years or older unless certain conditions are present.  Measles, mumps, and rubella (MMR) vaccine. Adults born before [birth year] generally are considered immune to measles and mumps. Adults born in [birth year] or later should have 1 or more doses of MMR vaccine unless there is a contraindication to the vaccine or there is laboratory evidence of immunity to each of the three diseases. A routine second dose of MMR vaccine should be obtained at least 28 days after the first dose for students attending postsecondary schools, health care workers, or international travelers. People who received inactivated measles vaccine or an unknown type of measles vaccine during 1963-1967 should receive 2 doses of MMR vaccine. People who received inactivated mumps vaccine or an unknown type of mumps vaccine before 1979 and are at high risk for mumps infection should consider immunization with 2 doses of MMR vaccine. For females of childbearing age, rubella immunity should be determined. If there is no evidence of immunity, females who are not pregnant should be vaccinated. If there is no evidence of immunity, females who are pregnant should delay immunization until after pregnancy. Unvaccinated health care workers born before [birth year] who lack laboratory evidence of measles, mumps, or rubella immunity or laboratory confirmation of disease should consider measles and mumps immunization with 2 doses of MMR vaccine or rubella immunization with 1 dose of MMR vaccine.  Pneumococcal 13-valent conjugate (PCV13) vaccine. When indicated, a person who is uncertain of her immunization history and has no record of immunization should receive the PCV13 vaccine. An adult aged 47 years or older who has certain medical conditions and has not been previously immunized should receive 1 dose of  PCV13 vaccine. This PCV13 should be followed with a dose of pneumococcal polysaccharide (PPSV23) vaccine. The PPSV23 vaccine dose should be obtained at least 8 weeks after the dose of PCV13 vaccine. An adult aged 66 years or older who has certain medical conditions and previously received 1 or more doses of PPSV23 vaccine should receive 1 dose of PCV13. The PCV13 vaccine dose should be obtained 1 or more years after the last PPSV23 vaccine dose.  Pneumococcal polysaccharide (PPSV23) vaccine. When PCV13 is also indicated, PCV13 should be obtained first. All adults aged 41 years and older should be immunized. An adult younger than age 20 years who has certain medical conditions should be immunized. Any person who resides in a nursing home or long-term care facility should be immunized. An adult smoker should be immunized. People with an immunocompromised condition and certain other conditions should receive both PCV13 and PPSV23 vaccines. People with human immunodeficiency virus (HIV) infection should be immunized as soon as possible after diagnosis. Immunization during chemotherapy or radiation therapy should be avoided. Routine use of PPSV23 vaccine is not recommended for American Indians, [place] Natives, or people younger than 65 years unless there are medical conditions that require PPSV23 vaccine. When indicated, people who have unknown immunization and have no record of immunization should receive PPSV23 vaccine. One-time revaccination 5 years after the first dose of PPSV23 is recommended for people aged 19-64 years who have chronic kidney failure, nephrotic syndrome, asplenia, or immunocompromised conditions. People who received 1-2 doses of PPSV23 before age 33 years should receive another dose of PPSV23 vaccine at age 19 years or later if at least 5 years have passed since the previous dose. Doses of PPSV23 are not needed for people immunized with PPSV23 at or after age 15 years.  Meningococcal vaccine.  Adults with asplenia or persistent complement component deficiencies should receive 2 doses of quadrivalent meningococcal conjugate (MenACWY-D) vaccine. The doses should be obtained at least 2 months apart. Microbiologists working with certain meningococcal bacteria, [place] recruits, people at risk during an outbreak, and people who travel to or live in countries with a high rate of meningitis should be immunized. A first-year college student up through age 52 years who is living in a residence hall should receive a dose if she did not receive a dose on or after her 16th birthday. Adults who have certain high-risk conditions should receive one or more doses of vaccine.  Hepatitis A vaccine. Adults who wish to be protected from this disease, have certain high-risk conditions, work with hepatitis A-infected animals, work in hepatitis A research labs, or travel to or work in countries with a high rate of hepatitis A should be immunized. Adults who were previously unvaccinated and who anticipate close contact with an international adoptee during the first 60 days after arrival in the [place] from a country with a high rate of hepatitis A should be immunized.  Hepatitis B vaccine. Adults who wish to be protected from this disease, have certain high-risk conditions, may be exposed to blood or other infectious body fluids, are household contacts or sex partners of hepatitis B positive people, are clients or workers in certain care facilities, or travel to or work in countries with a high rate of hepatitis B should be immunized.  Haemophilus influenzae type b (Hib) vaccine. A previously unvaccinated person with asplenia or sickle cell disease or having a scheduled splenectomy should receive 1 dose of Hib vaccine. Regardless of previous immunization, a recipient of a hematopoietic stem  cell transplant should receive a 3-dose series 6-12 months after her successful transplant. Hib vaccine is not recommended for  adults with HIV infection. Preventive Services / Frequency Ages 74 to 64years  Blood pressure check.** / Every 1 to 2 years.  Lipid and cholesterol check.** / Every 5 years beginning at age 15 years.  Lung cancer screening. / Every year if you are aged 6-80 years and have a 30-pack-year history of smoking and currently smoke or have quit within the past 15 years. Yearly screening is stopped once you have quit smoking for at least 15 years or develop a health problem that would prevent you from having lung cancer treatment.  Clinical breast exam.** / Every year after age 59 years.  BRCA-related cancer risk assessment.** / For women who have family members with a BRCA-related cancer (breast, ovarian, tubal, or peritoneal cancers).  Mammogram.** / Every year beginning at age 76 years and continuing for as long as you are in good health. Consult with your health care provider.  Pap test.** / Every 3 years starting at age 3 years through age 1 or 30 years with a history of 3 consecutive normal Pap tests.  HPV screening.** / Every 3 years from ages 40 years through ages 61 to 69 years with a history of 3 consecutive normal Pap tests.  Fecal occult blood test (FOBT) of stool. / Every year beginning at age 46 years and continuing until age 65 years. You may not need to do this test if you get a colonoscopy every 10 years.  Flexible sigmoidoscopy or colonoscopy.** / Every 5 years for a flexible sigmoidoscopy or every 10 years for a colonoscopy beginning at age 27 years and continuing until age 59 years.  Hepatitis C blood test.** / For all people born from [birth year] through 1965 and any individual with known risks for hepatitis C.  Skin self-exam. / Monthly.  Influenza vaccine. / Every year.  Tetanus, diphtheria, and acellular pertussis (Tdap/Td) vaccine.** / Consult your health care provider. Pregnant women should receive 1 dose of Tdap vaccine during each pregnancy. 1 dose of Td every 10  years.  Varicella vaccine.** / Consult your health care provider. Pregnant females who do not have evidence of immunity should receive the first dose after pregnancy.  Zoster vaccine.** / 1 dose for adults aged 14 years or older.  Measles, mumps, rubella (MMR) vaccine.** / You need at least 1 dose of MMR if you were born in 1957 or later. You may also need a 2nd dose. For females of childbearing age, rubella immunity should be determined. If there is no evidence of immunity, females who are not pregnant should be vaccinated. If there is no evidence of immunity, females who are pregnant should delay immunization until after pregnancy.  Pneumococcal 13-valent conjugate (PCV13) vaccine.** / Consult your health care provider.  Pneumococcal polysaccharide (PPSV23) vaccine.** / 1 to 2 doses if you smoke cigarettes or if you have certain conditions.  Meningococcal vaccine.** / Consult your health care provider.  Hepatitis A vaccine.** / Consult your health care provider.  Hepatitis B vaccine.** / Consult your health care provider.  Haemophilus influenzae type b (Hib) vaccine.** / Consult your health care provider. Ages 15 years and over  Blood pressure check.** / Every 1 to 2 years.  Lipid and cholesterol check.** / Every 5 years beginning at age 49 years.  Lung cancer screening. / Every year if you are aged 90-80 years and have a 30-pack-year history of smoking and currently  smoke or have quit within the past 15 years. Yearly screening is stopped once you have quit smoking for at least 15 years or develop a health problem that would prevent you from having lung cancer treatment.  Clinical breast exam.** / Every year after age 65 years.  BRCA-related cancer risk assessment.** / For women who have family members with a BRCA-related cancer (breast, ovarian, tubal, or peritoneal cancers).  Mammogram.** / Every year beginning at age 11 years and continuing for as long as you are in good health.  Consult with your health care provider.  Pap test.** / Every 3 years starting at age 38 years through age 45 or 34 years with 3 consecutive normal Pap tests. Testing can be stopped between 65 and 70 years with 3 consecutive normal Pap tests and no abnormal Pap or HPV tests in the past 10 years.  HPV screening.** / Every 3 years from ages 37 years through ages 30 or 8 years with a history of 3 consecutive normal Pap tests. Testing can be stopped between 65 and 70 years with 3 consecutive normal Pap tests and no abnormal Pap or HPV tests in the past 10 years.  Fecal occult blood test (FOBT) of stool. / Every year beginning at age 94 years and continuing until age 16 years. You may not need to do this test if you get a colonoscopy every 10 years.  Flexible sigmoidoscopy or colonoscopy.** / Every 5 years for a flexible sigmoidoscopy or every 10 years for a colonoscopy beginning at age 65 years and continuing until age 54 years.  Hepatitis C blood test.** / For all people born from [birth year] through 1965 and any individual with known risks for hepatitis C.  Osteoporosis screening.** / A one-time screening for women ages 57 years and over and women at risk for fractures or osteoporosis.  Skin self-exam. / Monthly.  Influenza vaccine. / Every year.  Tetanus, diphtheria, and acellular pertussis (Tdap/Td) vaccine.** / 1 dose of Td every 10 years.  Varicella vaccine.** / Consult your health care provider.  Zoster vaccine.** / 1 dose for adults aged 42 years or older.  Pneumococcal 13-valent conjugate (PCV13) vaccine.** / Consult your health care provider.  Pneumococcal polysaccharide (PPSV23) vaccine.** / 1 dose for all adults aged 65 years and older.  Meningococcal vaccine.** / Consult your health care provider.  Hepatitis A vaccine.** / Consult your health care provider.  Hepatitis B vaccine.** / Consult your health care provider.  Haemophilus influenzae type b (Hib) vaccine.** / Consult your  health care provider.

## 2014-03-20 ENCOUNTER — Other Ambulatory Visit: Payer: Self-pay | Admitting: Internal Medicine

## 2014-03-20 LAB — URINALYSIS, MICROSCOPIC ONLY
Bacteria, UA: NONE SEEN
Casts: NONE SEEN
Crystals: NONE SEEN

## 2014-03-20 LAB — HEMOGLOBIN A1C
Hgb A1c MFr Bld: 5 % (ref <5.7)
Mean Plasma Glucose: 97 mg/dL (ref <117)

## 2014-03-20 LAB — HEPATIC FUNCTION PANEL
ALT: 15 U/L (ref 0–35)
AST: 19 U/L (ref 0–37)
Albumin: 3.9 g/dL (ref 3.5–5.2)
Alkaline Phosphatase: 56 U/L (ref 39–117)
Bilirubin, Direct: 0.1 mg/dL (ref 0.0–0.3)
Indirect Bilirubin: 0.3 mg/dL (ref 0.2–1.2)
Total Bilirubin: 0.4 mg/dL (ref 0.2–1.2)
Total Protein: 6.1 g/dL (ref 6.0–8.3)

## 2014-03-20 LAB — MICROALBUMIN / CREATININE URINE RATIO
Creatinine, Urine: 133 mg/dL
Microalb Creat Ratio: 5 mg/g (ref 0.0–30.0)
Microalb, Ur: 0.66 mg/dL (ref 0.00–1.89)

## 2014-03-20 LAB — HEPATITIS C ANTIBODY: HCV Ab: NEGATIVE

## 2014-03-20 LAB — VITAMIN B12: Vitamin B-12: 266 pg/mL (ref 211–911)

## 2014-03-20 LAB — LIPID PANEL
Cholesterol: 209 mg/dL — ABNORMAL HIGH (ref 0–200)
HDL: 63 mg/dL (ref >39)
LDL Cholesterol: 113 mg/dL — ABNORMAL HIGH (ref 0–99)
Total CHOL/HDL Ratio: 3.3 Ratio
Triglycerides: 166 mg/dL — ABNORMAL HIGH (ref <150)
VLDL: 33 mg/dL (ref 0–40)

## 2014-03-20 LAB — BASIC METABOLIC PANEL WITH GFR
BUN: 9 mg/dL (ref 6–23)
CO2: 28 mEq/L (ref 19–32)
Calcium: 9 mg/dL (ref 8.4–10.5)
Chloride: 106 mEq/L (ref 96–112)
Creat: 0.66 mg/dL (ref 0.50–1.10)
GFR, Est African American: >89 mL/min
GFR, Est Non African American: >89 mL/min
Glucose, Bld: 98 mg/dL (ref 70–99)
Potassium: 4.1 mEq/L (ref 3.5–5.3)
Sodium: 140 mEq/L (ref 135–145)

## 2014-03-20 LAB — HEPATITIS A ANTIBODY, TOTAL: Hep A Total Ab: NONREACTIVE

## 2014-03-20 LAB — RPR

## 2014-03-20 LAB — MAGNESIUM: Magnesium: 2 mg/dL (ref 1.5–2.5)

## 2014-03-20 LAB — INSULIN, FASTING: Insulin fasting, serum: 26 u[IU]/mL (ref 3–28)

## 2014-03-20 LAB — HEPATITIS B CORE ANTIBODY, TOTAL: Hep B Core Total Ab: NONREACTIVE

## 2014-03-20 LAB — TSH: TSH: 0.805 u[IU]/mL (ref 0.350–4.500)

## 2014-03-20 LAB — HIV ANTIBODY (ROUTINE TESTING W REFLEX): HIV 1&2 Ab, 4th Generation: NONREACTIVE

## 2014-03-20 LAB — VITAMIN D 25 HYDROXY (VIT D DEFICIENCY, FRACTURES): Vit D, 25-Hydroxy: 50 ng/mL (ref 30–89)

## 2014-03-20 LAB — HEPATITIS B SURFACE ANTIBODY,QUALITATIVE: Hep B S Ab: NEGATIVE

## 2014-03-21 LAB — HEPATITIS B E ANTIBODY: Hepatitis Be Antibody: NONREACTIVE

## 2014-03-21 LAB — TB SKIN TEST
Induration: 0 mm
TB Skin Test: NEGATIVE

## 2014-07-21 ENCOUNTER — Encounter: Payer: Self-pay | Admitting: Internal Medicine

## 2014-07-21 ENCOUNTER — Ambulatory Visit (INDEPENDENT_AMBULATORY_CARE_PROVIDER_SITE_OTHER): Admitting: Internal Medicine

## 2014-07-21 VITALS — BP 128/70 | HR 72 | Temp 98.1°F | Resp 16 | Ht 70.0 in | Wt 176.0 lb

## 2014-07-21 DIAGNOSIS — R7309 Other abnormal glucose: Secondary | ICD-10-CM

## 2014-07-21 DIAGNOSIS — I10 Essential (primary) hypertension: Secondary | ICD-10-CM

## 2014-07-21 DIAGNOSIS — E782 Mixed hyperlipidemia: Secondary | ICD-10-CM

## 2014-07-21 DIAGNOSIS — Z79899 Other long term (current) drug therapy: Secondary | ICD-10-CM

## 2014-07-21 DIAGNOSIS — E559 Vitamin D deficiency, unspecified: Secondary | ICD-10-CM

## 2014-07-21 NOTE — Patient Instructions (Signed)

## 2014-07-21 NOTE — Progress Notes (Signed)
Patient ID: Brittany Hanna, female   DOB: [birth year], 63 y.o.   MRN: [id number]   This very nice 63 y.o.WWF presents for 3 month follow up with Hypertension, Hyperlipidemia, Pre-Diabetes and Vitamin D Deficiency.    Patient is treated for HTN & BP has been controlled at home. Today's BP: 128/70 mmHg. Patient has had no complaints of any cardiac type chest pain, palpitations, dyspnea/orthopnea/PND, dizziness, claudication, or dependent edema.   Hyperlipidemia is controlled with diet. Last Lipids were near goal - Total Chol 209; HDL  63; LDL  113; Trig 166 on 03/19/2014.   Also, the patient has history of PreDiabetes and has had no symptoms of reactive hypoglycemia, diabetic polys, paresthesias or visual blurring.  Last A1c was 5.0% on  03/19/2014.   Further, the patient also has history of Vitamin D Deficiency and supplements vitamin D without any suspected side-effects. Last vitamin D was  50 on 03/19/2014.    Medication List   CALCIUM PO  Take 1 tablet by mouth 3 (three) times a week. No specific days.     hyoscyamine 0.125 MG SL tablet  Commonly known as:  LEVSIN SL  Place 1 tablet (0.125 mg total) under the tongue every 4 (four) hours as needed.     MAGNESIUM PO  Take 1 tablet by mouth 3 (three) times a week. No specific days.     multivitamin with minerals Tabs tablet  Take 1 tablet by mouth 3 (three) times a week. No specific days.     sertraline 50 MG tablet  Commonly known as:  ZOLOFT  Take 50 mg by mouth daily.     sertraline 100 MG tablet  Commonly known as:  ZOLOFT  TAKE 1 TABLET BY MOUTH ONCE A DAY FOR MOOD     varenicline 1 MG tablet  Commonly known as:  CHANTIX CONTINUING MONTH PAK  Take 1 tablet (1 mg total) by mouth 2 (two) times daily.     VITAMIN B 12 PO  Take 1 tablet by mouth 3 (three) times a week. No specific days.     VITAMIN D PO  Take 5,000 Units by mouth 3 (three) times a week. No specific days.     Allergies  Allergen Reactions  . Lipitor  [Atorvastatin] Other (See Comments)    Fatigue.   PMHx:   Past Medical History  Diagnosis Date  . High cholesterol   . Hypertension   . IBS (irritable bowel syndrome)   . B12 deficiency   . Vitamin D deficiency    Immunization History  Administered Date(s) Administered  . PPD Test 03/19/2014  . Tdap 01/24/2013   Past Surgical History  Procedure Laterality Date  . Tubal ligation     FHx:    Reviewed / unchanged  SHx:    Reviewed / unchanged  Systems Review:  Constitutional: Denies fever, chills, wt changes, headaches, insomnia, fatigue, night sweats, change in appetite. Eyes: Denies redness, blurred vision, diplopia, discharge, itchy, watery eyes.  ENT: Denies discharge, congestion, post nasal drip, epistaxis, sore throat, earache, hearing loss, dental pain, tinnitus, vertigo, sinus pain, snoring.  CV: Denies chest pain, palpitations, irregular heartbeat, syncope, dyspnea, diaphoresis, orthopnea, PND, claudication or edema. Respiratory: denies cough, dyspnea, DOE, pleurisy, hoarseness, laryngitis, wheezing.  Gastrointestinal: Denies dysphagia, odynophagia, heartburn, reflux, water brash, abdominal pain or cramps, nausea, vomiting, bloating, diarrhea, constipation, hematemesis, melena, hematochezia  or hemorrhoids. Genitourinary: Denies dysuria, frequency, urgency, nocturia, hesitancy, discharge, hematuria or flank pain. Musculoskeletal: Denies arthralgias, myalgias, stiffness, jt. swelling,  pain, limping or strain/sprain.  Skin: Denies pruritus, rash, hives, warts, acne, eczema or change in skin lesion(s). Neuro: No weakness, tremor, incoordination, spasms, paresthesia or pain. Psychiatric: Denies confusion, memory loss or sensory loss. Endo: Denies change in weight, skin or hair change.  Heme/Lymph: No excessive bleeding, bruising or enlarged lymph nodes.  Exam:  BP 128/70   Pulse 72  Temp 98.1 F   Resp 16  Ht 5\' 10"   Wt 176 lb   BMI 25.25   Appears well nourished  and in no distress. Eyes: PERRLA, EOMs, conjunctiva no swelling or erythema. Sinuses: No frontal/maxillary tenderness ENT/Mouth: EAC's clear, TM's nl w/o erythema, bulging. Nares clear w/o erythema, swelling, exudates. Oropharynx clear without erythema or exudates. Oral hygiene is good. Tongue normal, non obstructing. Hearing intact.  Neck: Supple. Thyroid nl. Car 2+/2+ without bruits, nodes or JVD. Chest: Respirations nl with BS clear & equal w/o rales, rhonchi, wheezing or stridor.  Cor: Heart sounds normal w/ regular rate and rhythm without sig. murmurs, gallops, clicks, or rubs. Peripheral pulses normal and equal  without edema.  Abdomen: Soft & bowel sounds normal. Non-tender w/o guarding, rebound, hernias, masses, or organomegaly.  Lymphatics: Unremarkable.  Musculoskeletal: Full ROM all peripheral extremities, joint stability, 5/5 strength, and normal gait.  Skin: Warm, dry without exposed rashes, lesions or ecchymosis apparent.  Neuro: Cranial nerves intact, reflexes equal bilaterally. Sensory-motor testing grossly intact. Tendon reflexes grossly intact.  Pysch: Alert & oriented x 3.  Insight and judgement nl & appropriate. No ideations.  Assessment and Plan:  1. Hypertension - Continue monitor blood pressure at home. Continue diet/meds same.  2. Hyperlipidemia - Continue diet/meds, exercise,& lifestyle modifications. Continue monitor periodic cholesterol/liver & renal functions   3. Pre-Diabetes - Continue diet, exercise, lifestyle modifications. Monitor appropriate labs.  4. Vitamin D Deficiency - Continue supplementation.   Recommended regular exercise, BP monitoring, weight control, and discussed med and SE's. Recommended labs to assess and monitor clinical status. Further disposition pending results of labs.

## 2014-07-22 ENCOUNTER — Other Ambulatory Visit: Payer: Self-pay | Admitting: Internal Medicine

## 2014-07-22 DIAGNOSIS — Z79899 Other long term (current) drug therapy: Secondary | ICD-10-CM

## 2014-07-22 DIAGNOSIS — E559 Vitamin D deficiency, unspecified: Secondary | ICD-10-CM

## 2014-07-22 DIAGNOSIS — I10 Essential (primary) hypertension: Secondary | ICD-10-CM

## 2014-07-22 DIAGNOSIS — E782 Mixed hyperlipidemia: Secondary | ICD-10-CM

## 2014-07-22 DIAGNOSIS — R7309 Other abnormal glucose: Secondary | ICD-10-CM

## 2014-07-22 LAB — BASIC METABOLIC PANEL WITH GFR
BUN: 9 mg/dL (ref 6–23)
CO2: 30 mEq/L (ref 19–32)
Calcium: 9 mg/dL (ref 8.4–10.5)
Chloride: 100 mEq/L (ref 96–112)
Creat: 0.75 mg/dL (ref 0.50–1.10)
GFR, Est African American: >89 mL/min
GFR, Est Non African American: 85 mL/min
Glucose, Bld: 90 mg/dL (ref 70–99)
Potassium: 3.9 mEq/L (ref 3.5–5.3)
Sodium: 140 mEq/L (ref 135–145)

## 2014-07-22 LAB — HEPATIC FUNCTION PANEL
ALT: 12 U/L (ref 0–35)
AST: 15 U/L (ref 0–37)
Albumin: 3.7 g/dL (ref 3.5–5.2)
Alkaline Phosphatase: 49 U/L (ref 39–117)
Bilirubin, Direct: 0.1 mg/dL (ref 0.0–0.3)
Indirect Bilirubin: 0.2 mg/dL (ref 0.2–1.2)
Total Bilirubin: 0.3 mg/dL (ref 0.2–1.2)
Total Protein: 6.3 g/dL (ref 6.0–8.3)

## 2014-07-22 LAB — CBC WITH DIFFERENTIAL/PLATELET
Basophils Absolute: 0 10*3/uL (ref 0.0–0.1)
Basophils Relative: 1 % (ref 0–1)
Eosinophils Absolute: 0.2 10*3/uL (ref 0.0–0.7)
Eosinophils Relative: 6 % — ABNORMAL HIGH (ref 0–5)
HCT: 41.5 % (ref 36.0–46.0)
Hemoglobin: 14.1 g/dL (ref 12.0–15.0)
Lymphocytes Relative: 37 % (ref 12–46)
Lymphs Abs: 1.5 10*3/uL (ref 0.7–4.0)
MCH: 33.8 pg (ref 26.0–34.0)
MCHC: 34 g/dL (ref 30.0–36.0)
MCV: 99.5 fL (ref 78.0–100.0)
MPV: 10.4 fL (ref 9.4–12.4)
Monocytes Absolute: 0.4 10*3/uL (ref 0.1–1.0)
Monocytes Relative: 10 % (ref 3–12)
Neutro Abs: 1.9 10*3/uL (ref 1.7–7.7)
Neutrophils Relative %: 46 % (ref 43–77)
Platelets: 207 10*3/uL (ref 150–400)
RBC: 4.17 MIL/uL (ref 3.87–5.11)
RDW: 12.5 % (ref 11.5–15.5)
WBC: 4.1 10*3/uL (ref 4.0–10.5)

## 2014-07-22 LAB — INSULIN, FASTING: Insulin fasting, serum: 9.9 u[IU]/mL (ref 2.0–19.6)

## 2014-07-22 LAB — HEMOGLOBIN A1C
Hgb A1c MFr Bld: 5 % (ref <5.7)
Mean Plasma Glucose: 97 mg/dL (ref <117)

## 2014-07-22 LAB — LIPID PANEL
Cholesterol: 203 mg/dL — ABNORMAL HIGH (ref 0–200)
HDL: 58 mg/dL (ref >39)
LDL Cholesterol: 120 mg/dL — ABNORMAL HIGH (ref 0–99)
Total CHOL/HDL Ratio: 3.5 Ratio
Triglycerides: 125 mg/dL (ref <150)
VLDL: 25 mg/dL (ref 0–40)

## 2014-07-22 LAB — MAGNESIUM: Magnesium: 2 mg/dL (ref 1.5–2.5)

## 2014-07-22 LAB — TSH: TSH: 0.752 u[IU]/mL (ref 0.350–4.500)

## 2014-07-22 LAB — VITAMIN D 25 HYDROXY (VIT D DEFICIENCY, FRACTURES): Vit D, 25-Hydroxy: 41 ng/mL (ref 30–100)

## 2014-07-22 MED ORDER — GEMFIBROZIL 600 MG PO TABS: ORAL_TABLET | ORAL | Status: DC

## 2014-09-24 ENCOUNTER — Ambulatory Visit: Admitting: Internal Medicine

## 2014-09-24 ENCOUNTER — Encounter: Payer: Self-pay | Admitting: Internal Medicine

## 2014-09-24 NOTE — Progress Notes (Signed)
Patient ID: Brittany Hanna, female   DOB: Oct 09, 1950, 64 y.o.   MRN: [id number]  Brittany Hanna

## 2015-03-24 ENCOUNTER — Ambulatory Visit (INDEPENDENT_AMBULATORY_CARE_PROVIDER_SITE_OTHER): Admitting: Internal Medicine

## 2015-03-24 ENCOUNTER — Encounter: Payer: Self-pay | Admitting: Internal Medicine

## 2015-03-24 VITALS — BP 122/74 | HR 72 | Temp 97.7°F | Resp 16 | Ht 70.0 in | Wt 166.2 lb

## 2015-03-24 DIAGNOSIS — Z79899 Other long term (current) drug therapy: Secondary | ICD-10-CM | POA: Diagnosis not present

## 2015-03-24 DIAGNOSIS — Z6823 Body mass index (BMI) 23.0-23.9, adult: Secondary | ICD-10-CM

## 2015-03-24 DIAGNOSIS — Z1212 Encounter for screening for malignant neoplasm of rectum: Secondary | ICD-10-CM

## 2015-03-24 DIAGNOSIS — E782 Mixed hyperlipidemia: Secondary | ICD-10-CM

## 2015-03-24 DIAGNOSIS — Z0000 Encounter for general adult medical examination without abnormal findings: Secondary | ICD-10-CM | POA: Diagnosis not present

## 2015-03-24 DIAGNOSIS — I10 Essential (primary) hypertension: Secondary | ICD-10-CM

## 2015-03-24 DIAGNOSIS — R5383 Other fatigue: Secondary | ICD-10-CM

## 2015-03-24 DIAGNOSIS — E559 Vitamin D deficiency, unspecified: Secondary | ICD-10-CM | POA: Diagnosis not present

## 2015-03-24 DIAGNOSIS — Z111 Encounter for screening for respiratory tuberculosis: Secondary | ICD-10-CM

## 2015-03-24 DIAGNOSIS — R7309 Other abnormal glucose: Secondary | ICD-10-CM

## 2015-03-24 NOTE — Progress Notes (Signed)
Patient ID: Brittany Hanna, female   DOB: 1950-10-14, 64 y.o.   MRN: [id number]  Annual Comprehensive Examination  This very nice 64 y.o. WWF presents for complete physical.  Patient has been followed expectantly for elevated BP, Cholesterol, Blood sugar, Hyperlipidemia and Vitamin D Deficiency.    HTN predates since 2005  And had a Neg Cardiolite in 2005. Patient's BP has been controlled at home and patient denies any cardiac symptoms as chest pain, palpitations, shortness of breath, dizziness or ankle swelling. Today's BP: 122/74 mmHg    Patient's hyperlipidemia is not controlled with diet and Gemfibrozil (patient had been intolerant to daily dosed statins). Patient denies myalgias or other medication SE's. Last lipids were not at goal - Cholesterol; HDL 58; LDL 120; Triglycerides 125 on 07/21/2014.   Patient has prediabetes predating and is monitored expectantly  and patient denies reactive hypoglycemic symptoms, visual blurring, diabetic polys, or paresthesias. Last A1c was 5.0% on 07/21/2014.      Finally, patient has history of Vitamin D Deficiency of "14" in 2008 and last Vitamin D was still low at 41 on      07/21/2014.  Medication Sig  . aspirin EC 81 MG Take 81 mg by mouth daily.  Marland Kitchen CALCIUM PO Take 1 tablet by mouth 3 (three) times a week. No specific days.  Marland Kitchen VITAMIN D  Take 5,000 Units by mouth 3 (three) times a week. No specific days.  Marland Kitchen VITAMIN B 12 ) Take 1 tablet by mouth 3 (three) times a week. No specific days.  . hyoscyamine  SL 0.125 MG SL  Place 1 tablet (0.125 mg total) under the tongue every 4 (four) hours as needed.  . sertraline  100 MG tablet TAKE 1 TABLET BY MOUTH ONCE A DAY FOR MOOD  . gemfibrozil  600 MG tablet Take 1 tablet 2 x day with food for cholesterol  . MAGNESIUM  Take 1 tablet by mouth 3 (three) times a week. No specific days.  . MULTIVITAMIN WITH MINERALS Take 1 tablet by mouth 3 (three) times a week. No specific days.  Hendricks Limes CONTINUING MONTH PAK -   1 MG  Take 1 tablet (1 mg total) by mouth 2 (two) times daily.   Allergies  Allergen Reactions  . Lipitor [Atorvastatin] Other (See Comments)    Fatigue.   Past Medical History  Diagnosis Date  . High cholesterol   . Hypertension   . IBS (irritable bowel syndrome)   . B12 deficiency   . Vitamin D deficiency    Health Maintenance  Topic Date Due  . PAP SMEAR  07/07/1969  . ZOSTAVAX  07/08/2011  . MAMMOGRAM  01/25/2013  . INFLUENZA VACCINE  04/06/2015  . COLONOSCOPY  03/06/2019  . TETANUS/TDAP  01/25/2023  . HIV Screening  Completed   Immunization History  Administered Date(s) Administered  . PPD Test 03/19/2014  . Tdap 01/24/2013   Past Surgical History  Procedure Laterality Date  . Tubal ligation     Family History  Problem Relation Age of Onset  . Heart failure Mother   . Osteoarthritis Father   . Hypertension Father   . COPD Father    History  Substance Use Topics  . Smoking status: Current Some Day Smoker -- 1.00 packs/day    Types: Cigarettes  . Smokeless tobacco: Not on file  . Alcohol Use: Yes     Comment: occasionally    ROS Constitutional: Denies fever, chills, weight loss/gain, headaches, insomnia,  night sweats, and  change in appetite. Does c/o fatigue. Eyes: Denies redness, blurred vision, diplopia, discharge, itchy, watery eyes.  ENT: Denies discharge, congestion, post nasal drip, epistaxis, sore throat, earache, hearing loss, dental pain, Tinnitus, Vertigo, Sinus pain, snoring.  Cardio: Denies chest pain, palpitations, irregular heartbeat, syncope, dyspnea, diaphoresis, orthopnea, PND, claudication, edema Respiratory: denies cough, dyspnea, DOE, pleurisy, hoarseness, laryngitis, wheezing.  Gastrointestinal: Denies dysphagia, heartburn, reflux, water brash, pain, cramps, nausea, vomiting, bloating, diarrhea, constipation, hematemesis, melena, hematochezia, jaundice, hemorrhoids Genitourinary: Denies dysuria, frequency, urgency, nocturia, hesitancy,  discharge, hematuria, flank pain Breast: Breast lumps, nipple discharge, bleeding.  Musculoskeletal: Denies arthralgia, myalgia, stiffness, Jt. Swelling, pain, limp, and strain/sprain. Denies falls. Skin: Denies puritis, rash, hives, warts, acne, eczema, changing in skin lesion Neuro: No weakness, tremor, incoordination, spasms, paresthesia, pain Psychiatric: Denies confusion, memory loss, sensory loss. Denies Depression. Endocrine: Denies change in weight, skin, hair change, nocturia, and paresthesia, diabetic polys, visual blurring, hyper / hypo glycemic episodes.  Heme/Lymph: No excessive bleeding, bruising, enlarged lymph nodes.  Physical Exam  BP 122/74   Pulse 72  Temp 97.7 F   Resp 16  Ht 5\' 10"    Wt 166 lb 3.2 oz     BMI 23.85   General Appearance: Well nourished and in no apparent distress. Eyes: PERRLA, EOMs, conjunctiva no swelling or erythema, normal fundi and vessels. Sinuses: No frontal/maxillary tenderness ENT/Mouth: EACs patent / TMs  nl. Nares clear without erythema, swelling, mucoid exudates. Oral hygiene is good. No erythema, swelling, or exudate. Tongue normal, non-obstructing. Tonsils not swollen or erythematous. Hearing normal.  Neck: Supple, thyroid normal. No bruits, nodes or JVD. Respiratory: Respiratory effort normal.  BS equal and clear bilateral without rales, rhonci, wheezing or stridor. Cardio: Heart sounds are normal with regular rate and rhythm and no murmurs, rubs or gallops. Peripheral pulses are normal and equal bilaterally without edema. No aortic or femoral bruits. Chest: symmetric with normal excursions and percussion. Breasts: Symmetric, without lumps, nipple discharge, retractions, or fibrocystic changes.  Abdomen: Flat, soft, with bowel sounds. Nontender, no guarding, rebound, hernias, masses, or organomegaly.  Lymphatics: Non tender without lymphadenopathy.  Genitourinary:  Musculoskeletal: Full ROM all peripheral extremities, joint stability,  5/5 strength, and normal gait. Skin: Warm and dry without rashes, lesions, cyanosis, clubbing or  ecchymosis.  Neuro: Cranial nerves intact, reflexes equal bilaterally. Normal muscle tone, no cerebellar symptoms. Sensation intact.  Pysch: Awake and oriented X 3, normal affect, Insight and Judgment appropriate.   Assessment and Plan  1. Essential hypertension  - Microalbumin / creatinine urine ratio - EKG 12-Lead - [place], RETROPERITNL ABD,  LTD - TSH  2. Mixed hyperlipidemia  - Lipid panel  3. Abnormal glucose  - Hemoglobin A1c - Insulin, random  4. Vitamin D deficiency  - Vit D  25 hydroxy (rtn osteoporosis monitoring)  5. Screening for rectal cancer  - POC Hemoccult Bld/Stl   6. Other fatigue  - Vitamin B12 - Iron and TIBC  7. Medication management  - Urine Microscopic - CBC with Differential/Platelet - BASIC METABOLIC PANEL WITH GFR - Hepatic function panel - Magnesium   Continue prudent diet as discussed, weight control, BP monitoring, regular exercise, and medications. Discussed med's effects and SE's. Screening labs and tests as requested with regular follow-up as recommended.  Over 40 minutes of exam, counseling, chart review was performed.

## 2015-03-24 NOTE — Patient Instructions (Signed)
Recommend Adult Low dose Aspirin or coated  Aspirin 81 mg daily   To reduce risk of Colon Cancer 20 %,   Skin Cancer 26 % ,   Melanoma 46%   and   Pancreatic cancer 60% ++++++++++++++++++ Vitamin D goal is between 70-100.   Please make sure that you are taking your Vitamin D as directed.   It is very important as a natural anti-inflammatory   helping hair, skin, and nails, as well as reducing stroke and heart attack risk.   It helps your bones and helps with mood.  It also decreases numerous cancer risks so please take it as directed.   Low Vit D is associated with a 200-300% higher risk for CANCER   and 200-300% higher risk for HEART   ATTACK  &  STROKE.   ......................................  It is also associated with higher death rate at younger ages,   autoimmune diseases like Rheumatoid arthritis, Lupus, Multiple Sclerosis.     Also many other serious conditions, like depression, Alzheimer's  Dementia, infertility, muscle aches, fatigue, fibromyalgia - just to name a few.  +++++++++++++++++++  Recommend the book "The END of DIETING" by Dr Joel Fuhrman   & the book "The END of DIABETES " by Dr Joel Fuhrman  At [employer] - get book & Audio CD's     Being diabetic has a  300% increased risk for heart attack, stroke, cancer, and alzheimer- type vascular dementia. It is very important that you work harder with diet by avoiding all foods that are white. Avoid white rice (brown & wild rice is OK), white potatoes (sweetpotatoes in moderation is OK), White bread or wheat bread or anything made out of white flour like bagels, donuts, rolls, buns, biscuits, cakes, pastries, cookies, pizza crust, and pasta (made from white flour & egg whites) - vegetarian pasta or spinach or wheat pasta is OK. Multigrain breads like Arnold's or [employer], or multigrain sandwich thins or flatbreads.  Diet, exercise and weight loss can reverse and cure diabetes in the early stages.   Diet, exercise and weight loss is very important in the control and prevention of complications of diabetes which affects every system in your body, ie. Brain - dementia/stroke, eyes - glaucoma/blindness, heart - heart attack/heart failure, kidneys - dialysis, stomach - gastric paralysis, intestines - malabsorption, nerves - severe painful neuritis, circulation - gangrene & loss of a leg(s), and finally cancer and Alzheimers.    I recommend avoid fried & greasy foods,  sweets/candy, white rice (brown or wild rice or Quinoa is OK), white potatoes (sweet potatoes are OK) - anything made from white flour - bagels, doughnuts, rolls, buns, biscuits,white and wheat breads, pizza crust and traditional pasta made of white flour & egg white(vegetarian pasta or spinach or wheat pasta is OK).  Multi-grain bread is OK - like multi-grain flat bread or sandwich thins. Avoid alcohol in excess. Exercise is also important.    Eat all the vegetables you want - avoid meat, especially red meat and dairy - especially cheese.  Cheese is the most concentrated form of trans-fats which is the worst thing to clog up our arteries. Veggie cheese is OK which can be found in the fresh produce section at [employer] or [employer] or [employer]  ++++++++++++++++++++++++++   Preventive Care for Adults  A healthy lifestyle and preventive care can promote health and wellness. Preventive health guidelines for women include the following key practices.  A routine yearly physical is a good way   to check with your health care provider about your health and preventive screening. It is a chance to share any concerns and updates on your health and to receive a thorough exam.  Visit your dentist for a routine exam and preventive care every 6 months. Brush your teeth twice a day and floss once a day. Good oral hygiene prevents tooth decay and gum disease.  The frequency of eye exams is based on your age, health, family medical history, use  of contact lenses, and other factors. Follow your health care provider's recommendations for frequency of eye exams.  Eat a healthy diet. Foods like vegetables, fruits, whole grains, low-fat dairy products, and lean protein foods contain the nutrients you need without too many calories. Decrease your intake of foods high in solid fats, added sugars, and salt. Eat the right amount of calories for you.Get information about a proper diet from your health care provider, if necessary.  Regular physical exercise is one of the most important things you can do for your health. Most adults should get at least 150 minutes of moderate-intensity exercise (any activity that increases your heart rate and causes you to sweat) each week. In addition, most adults need muscle-strengthening exercises on 2 or more days a week.  Maintain a healthy weight. The body mass index (BMI) is a screening tool to identify possible weight problems. It provides an estimate of body fat based on height and weight. Your health care provider can find your BMI and can help you achieve or maintain a healthy weight.For adults 20 years and older:  A BMI below 18.5 is considered underweight.  A BMI of 18.5 to 24.9 is normal.  A BMI of 25 to 29.9 is considered overweight.  A BMI of 30 and above is considered obese.  Maintain normal blood lipids and cholesterol levels by exercising and minimizing your intake of saturated fat. Eat a balanced diet with plenty of fruit and vegetables. Blood tests for lipids and cholesterol should begin at age 74 and be repeated every 5 years. If your lipid or cholesterol levels are high, you are over 50, or you are at high risk for heart disease, you may need your cholesterol levels checked more frequently.Ongoing high lipid and cholesterol levels should be treated with medicines if diet and exercise are not working.  If you smoke, find out from your health care provider how to quit. If you do not use  tobacco, do not start.  Lung cancer screening is recommended for adults aged 68-80 years who are at high risk for developing lung cancer because of a history of smoking. A yearly low-dose CT scan of the lungs is recommended for people who have at least a 30-pack-year history of smoking and are a current smoker or have quit within the past 15 years. A pack year of smoking is smoking an average of 1 pack of cigarettes a day for 1 year (for example: 1 pack a day for 30 years or 2 packs a day for 15 years). Yearly screening should continue until the smoker has stopped smoking for at least 15 years. Yearly screening should be stopped for people who develop a health problem that would prevent them from having lung cancer treatment.  High blood pressure causes heart disease and increases the risk of stroke. Your blood pressure should be checked at least every 1 to 2 years. Ongoing high blood pressure should be treated with medicines if weight loss and exercise do not work.  If you  are 21-72 years old, ask your health care provider if you should take aspirin to prevent strokes.  Diabetes screening involves taking a blood sample to check your fasting blood sugar level. This should be done once every 3 years, after age 16, if you are within normal weight and without risk factors for diabetes. Testing should be considered at a younger age or be carried out more frequently if you are overweight and have at least 1 risk factor for diabetes.  Breast cancer screening is essential preventive care for women. You should practice "breast self-awareness." This means understanding the normal appearance and feel of your breasts and may include breast self-examination. Any changes detected, no matter how small, should be reported to a health care provider. Women in their 4s and 30s should have a clinical breast exam (CBE) by a health care provider as part of a regular health exam every 1 to 3 years. After age 39, women should  have a CBE every year. Starting at age 31, women should consider having a mammogram (breast X-ray test) every year. Women who have a family history of breast cancer should talk to their health care provider about genetic screening. Women at a high risk of breast cancer should talk to their health care providers about having an MRI and a mammogram every year.  Breast cancer gene (BRCA)-related cancer risk assessment is recommended for women who have family members with BRCA-related cancers. BRCA-related cancers include breast, ovarian, tubal, and peritoneal cancers. Having family members with these cancers may be associated with an increased risk for harmful changes (mutations) in the breast cancer genes BRCA1 and BRCA2. Results of the assessment will determine the need for genetic counseling and BRCA1 and BRCA2 testing.  Routine pelvic exams to screen for cancer are no longer recommended for nonpregnant women who are considered low risk for cancer of the pelvic organs (ovaries, uterus, and vagina) and who do not have symptoms. Ask your health care provider if a screening pelvic exam is right for you.  If you have had past treatment for cervical cancer or a condition that could lead to cancer, you need Pap tests and screening for cancer for at least 20 years after your treatment. If Pap tests have been discontinued, your risk factors (such as having a new sexual partner) need to be reassessed to determine if screening should be resumed. Some women have medical problems that increase the chance of getting cervical cancer. In these cases, your health care provider may recommend more frequent screening and Pap tests.  Colorectal cancer can be detected and often prevented. Most routine colorectal cancer screening begins at the age of 85 years and continues through age 75 years. However, your health care provider may recommend screening at an earlier age if you have risk factors for colon cancer. On a yearly  basis, your health care provider may provide home test kits to check for hidden blood in the stool. Use of a small camera at the end of a tube, to directly examine the colon (sigmoidoscopy or colonoscopy), can detect the earliest forms of colorectal cancer. Talk to your health care provider about this at age 83, when routine screening begins. Direct exam of the colon should be repeated every 5-10 years through age 31 years, unless early forms of pre-cancerous polyps or small growths are found.  Hepatitis C blood testing is recommended for all people born from [birth year] through 1965 and any individual with known risks for hepatitis C.  Pra  Osteoporosis  is a disease in which the bones lose minerals and strength with aging. This can result in serious bone fractures or breaks. The risk of osteoporosis can be identified using a bone density scan. Women ages 33 years and over and women at risk for fractures or osteoporosis should discuss screening with their health care providers. Ask your health care provider whether you should take a calcium supplement or vitamin D to reduce the rate of osteoporosis.  Menopause can be associated with physical symptoms and risks. Hormone replacement therapy is available to decrease symptoms and risks. You should talk to your health care provider about whether hormone replacement therapy is right for you.  Use sunscreen. Apply sunscreen liberally and repeatedly throughout the day. You should seek shade when your shadow is shorter than you. Protect yourself by wearing long sleeves, pants, a wide-brimmed hat, and sunglasses year round, whenever you are outdoors.  Once a month, do a whole body skin exam, using a mirror to look at the skin on your back. Tell your health care provider of new moles, moles that have irregular borders, moles that are larger than a pencil eraser, or moles that have changed in shape or color.  Stay current with required vaccines  (immunizations).  Influenza vaccine. All adults should be immunized every year.  Tetanus, diphtheria, and acellular pertussis (Td, Tdap) vaccine. Pregnant women should receive 1 dose of Tdap vaccine during each pregnancy. The dose should be obtained regardless of the length of time since the last dose. Immunization is preferred during the 27th-36th week of gestation. An adult who has not previously received Tdap or who does not know her vaccine status should receive 1 dose of Tdap. This initial dose should be followed by tetanus and diphtheria toxoids (Td) booster doses every 10 years. Adults with an unknown or incomplete history of completing a 3-dose immunization series with Td-containing vaccines should begin or complete a primary immunization series including a Tdap dose. Adults should receive a Td booster every 10 years.  Varicella vaccine. An adult without evidence of immunity to varicella should receive 2 doses or a second dose if she has previously received 1 dose. Pregnant females who do not have evidence of immunity should receive the first dose after pregnancy. This first dose should be obtained before leaving the health care facility. The second dose should be obtained 4-8 weeks after the first dose.  Human papillomavirus (HPV) vaccine. Females aged 13-26 years who have not received the vaccine previously should obtain the 3-dose series. The vaccine is not recommended for use in pregnant females. However, pregnancy testing is not needed before receiving a dose. If a female is found to be pregnant after receiving a dose, no treatment is needed. In that case, the remaining doses should be delayed until after the pregnancy. Immunization is recommended for any person with an immunocompromised condition through the age of 11 years if she did not get any or all doses earlier. During the 3-dose series, the second dose should be obtained 4-8 weeks after the first dose. The third dose should be obtained  24 weeks after the first dose and 16 weeks after the second dose.  Zoster vaccine. One dose is recommended for adults aged 26 years or older unless certain conditions are present.  Measles, mumps, and rubella (MMR) vaccine. Adults born before [birth year] generally are considered immune to measles and mumps. Adults born in [birth year] or later should have 1 or more doses of MMR vaccine unless there is a contraindication  to the vaccine or there is laboratory evidence of immunity to each of the three diseases. A routine second dose of MMR vaccine should be obtained at least 28 days after the first dose for students attending postsecondary schools, health care workers, or international travelers. People who received inactivated measles vaccine or an unknown type of measles vaccine during 1963-1967 should receive 2 doses of MMR vaccine. People who received inactivated mumps vaccine or an unknown type of mumps vaccine before 1979 and are at high risk for mumps infection should consider immunization with 2 doses of MMR vaccine. For females of childbearing age, rubella immunity should be determined. If there is no evidence of immunity, females who are not pregnant should be vaccinated. If there is no evidence of immunity, females who are pregnant should delay immunization until after pregnancy. Unvaccinated health care workers born before [birth year] who lack laboratory evidence of measles, mumps, or rubella immunity or laboratory confirmation of disease should consider measles and mumps immunization with 2 doses of MMR vaccine or rubella immunization with 1 dose of MMR vaccine.  Pneumococcal 13-valent conjugate (PCV13) vaccine. When indicated, a person who is uncertain of her immunization history and has no record of immunization should receive the PCV13 vaccine. An adult aged 26 years or older who has certain medical conditions and has not been previously immunized should receive 1 dose of PCV13 vaccine. This PCV13 should be followed  with a dose of pneumococcal polysaccharide (PPSV23) vaccine. The PPSV23 vaccine dose should be obtained at least 8 weeks after the dose of PCV13 vaccine. An adult aged 78 years or older who has certain medical conditions and previously received 1 or more doses of PPSV23 vaccine should receive 1 dose of PCV13. The PCV13 vaccine dose should be obtained 1 or more years after the last PPSV23 vaccine dose.    Pneumococcal polysaccharide (PPSV23) vaccine. When PCV13 is also indicated, PCV13 should be obtained first. All adults aged 9 years and older should be immunized. An adult younger than age 13 years who has certain medical conditions should be immunized. Any person who resides in a nursing home or long-term care facility should be immunized. An adult smoker should be immunized. People with an immunocompromised condition and certain other conditions should receive both PCV13 and PPSV23 vaccines. People with human immunodeficiency virus (HIV) infection should be immunized as soon as possible after diagnosis. Immunization during chemotherapy or radiation therapy should be avoided. Routine use of PPSV23 vaccine is not recommended for American Indians, [place] Natives, or people younger than 65 years unless there are medical conditions that require PPSV23 vaccine. When indicated, people who have unknown immunization and have no record of immunization should receive PPSV23 vaccine. One-time revaccination 5 years after the first dose of PPSV23 is recommended for people aged 19-64 years who have chronic kidney failure, nephrotic syndrome, asplenia, or immunocompromised conditions. People who received 1-2 doses of PPSV23 before age 64 years should receive another dose of PPSV23 vaccine at age 53 years or later if at least 5 years have passed since the previous dose. Doses of PPSV23 are not needed for people immunized with PPSV23 at or after age 23 years.  Preventive Services / Frequency   Ages 73 to 11 years  Blood  pressure check.  Lipid and cholesterol check.  Lung cancer screening. / Every year if you are aged 42-80 years and have a 30-pack-year history of smoking and currently smoke or have quit within the past 15 years. Yearly screening is stopped once  you have quit smoking for at least 15 years or develop a health problem that would prevent you from having lung cancer treatment.  Clinical breast exam.** / Every year after age 35 years.  BRCA-related cancer risk assessment.** / For women who have family members with a BRCA-related cancer (breast, ovarian, tubal, or peritoneal cancers).  Mammogram.** / Every year beginning at age 73 years and continuing for as long as you are in good health. Consult with your health care provider.  Pap test.** / Every 3 years starting at age 51 years through age 46 or 1 years with a history of 3 consecutive normal Pap tests.  HPV screening.** / Every 3 years from ages 46 years through ages 77 to 82 years with a history of 3 consecutive normal Pap tests.  Fecal occult blood test (FOBT) of stool. / Every year beginning at age 12 years and continuing until age 58 years. You may not need to do this test if you get a colonoscopy every 10 years.  Flexible sigmoidoscopy or colonoscopy.** / Every 5 years for a flexible sigmoidoscopy or every 10 years for a colonoscopy beginning at age 71 years and continuing until age 72 years.  Hepatitis C blood test.** / For all people born from [birth year] through 1965 and any individual with known risks for hepatitis C.  Skin self-exam. / Monthly.  Influenza vaccine. / Every year.  Tetanus, diphtheria, and acellular pertussis (Tdap/Td) vaccine.** / Consult your health care provider. Pregnant women should receive 1 dose of Tdap vaccine during each pregnancy. 1 dose of Td every 10 years.  Varicella vaccine.** / Consult your health care provider. Pregnant females who do not have evidence of immunity should receive the first dose after  pregnancy.  Zoster vaccine.** / 1 dose for adults aged 89 years or older.  Pneumococcal 13-valent conjugate (PCV13) vaccine.** / Consult your health care provider.  Pneumococcal polysaccharide (PPSV23) vaccine.** / 1 to 2 doses if you smoke cigarettes or if you have certain conditions.  Meningococcal vaccine.** / Consult your health care provider.  Hepatitis A vaccine.** / Consult your health care provider.  Hepatitis B vaccine.** / Consult your health care provider. Screening for abdominal aortic aneurysm (AAA)  by ultrasound is recommended for people over 50 who have history of high blood pressure or who are current or former smokers.

## 2015-03-25 LAB — CBC WITH DIFFERENTIAL/PLATELET
Basophils Absolute: 0 10*3/uL (ref 0.0–0.1)
Basophils Relative: 1 % (ref 0–1)
Eosinophils Absolute: 0.1 10*3/uL (ref 0.0–0.7)
Eosinophils Relative: 3 % (ref 0–5)
HCT: 41.7 % (ref 36.0–46.0)
Hemoglobin: 14 g/dL (ref 12.0–15.0)
Lymphocytes Relative: 35 % (ref 12–46)
Lymphs Abs: 1.3 10*3/uL (ref 0.7–4.0)
MCH: 33.2 pg (ref 26.0–34.0)
MCHC: 33.6 g/dL (ref 30.0–36.0)
MCV: 98.8 fL (ref 78.0–100.0)
MPV: 10.5 fL (ref 8.6–12.4)
Monocytes Absolute: 0.4 10*3/uL (ref 0.1–1.0)
Monocytes Relative: 10 % (ref 3–12)
Neutro Abs: 1.9 10*3/uL (ref 1.7–7.7)
Neutrophils Relative %: 51 % (ref 43–77)
Platelets: 211 10*3/uL (ref 150–400)
RBC: 4.22 MIL/uL (ref 3.87–5.11)
RDW: 13.5 % (ref 11.5–15.5)
WBC: 3.8 10*3/uL — ABNORMAL LOW (ref 4.0–10.5)

## 2015-03-25 LAB — BASIC METABOLIC PANEL WITH GFR
BUN: 7 mg/dL (ref 6–23)
CO2: 28 mEq/L (ref 19–32)
Calcium: 9.2 mg/dL (ref 8.4–10.5)
Chloride: 105 mEq/L (ref 96–112)
Creat: 0.9 mg/dL (ref 0.50–1.10)
GFR, Est African American: 79 mL/min
GFR, Est Non African American: 68 mL/min
Glucose, Bld: 86 mg/dL (ref 70–99)
Potassium: 3.9 mEq/L (ref 3.5–5.3)
Sodium: 142 mEq/L (ref 135–145)

## 2015-03-25 LAB — HEPATIC FUNCTION PANEL
ALT: 9 U/L (ref 0–35)
AST: 14 U/L (ref 0–37)
Albumin: 3.8 g/dL (ref 3.5–5.2)
Alkaline Phosphatase: 52 U/L (ref 39–117)
Bilirubin, Direct: 0.1 mg/dL (ref 0.0–0.3)
Indirect Bilirubin: 0.2 mg/dL (ref 0.2–1.2)
Total Bilirubin: 0.3 mg/dL (ref 0.2–1.2)
Total Protein: 6.2 g/dL (ref 6.0–8.3)

## 2015-03-25 LAB — MICROALBUMIN / CREATININE URINE RATIO
Creatinine, Urine: 93.6 mg/dL
Microalb Creat Ratio: 2.1 mg/g (ref 0.0–30.0)
Microalb, Ur: 0.2 mg/dL (ref <2.0)

## 2015-03-25 LAB — LIPID PANEL
Cholesterol: 219 mg/dL — ABNORMAL HIGH (ref 0–200)
HDL: 54 mg/dL (ref >=46)
LDL Cholesterol: 125 mg/dL — ABNORMAL HIGH (ref 0–99)
Total CHOL/HDL Ratio: 4.1 Ratio
Triglycerides: 199 mg/dL — ABNORMAL HIGH (ref <150)
VLDL: 40 mg/dL (ref 0–40)

## 2015-03-25 LAB — VITAMIN B12: Vitamin B-12: 207 pg/mL — ABNORMAL LOW (ref 211–911)

## 2015-03-25 LAB — URINALYSIS, MICROSCOPIC ONLY
Bacteria, UA: NONE SEEN
Casts: NONE SEEN
Crystals: NONE SEEN

## 2015-03-25 LAB — HEMOGLOBIN A1C
Hgb A1c MFr Bld: 5.1 % (ref <5.7)
Mean Plasma Glucose: 100 mg/dL (ref <117)

## 2015-03-25 LAB — INSULIN, RANDOM: Insulin: 13 u[IU]/mL (ref 2.0–19.6)

## 2015-03-25 LAB — IRON AND TIBC
%SAT: 22 % (ref 20–55)
Iron: 57 ug/dL (ref 42–145)
TIBC: 262 ug/dL (ref 250–470)
UIBC: 205 ug/dL (ref 125–400)

## 2015-03-25 LAB — MAGNESIUM: Magnesium: 2.2 mg/dL (ref 1.5–2.5)

## 2015-03-25 LAB — TSH: TSH: 0.551 u[IU]/mL (ref 0.350–4.500)

## 2015-03-25 LAB — VITAMIN D 25 HYDROXY (VIT D DEFICIENCY, FRACTURES): Vit D, 25-Hydroxy: 34 ng/mL (ref 30–100)

## 2015-03-26 LAB — TB SKIN TEST
Induration: 0 mm
TB Skin Test: NEGATIVE

## 2015-04-14 ENCOUNTER — Other Ambulatory Visit: Payer: Self-pay | Admitting: Emergency Medicine

## 2015-04-28 ENCOUNTER — Other Ambulatory Visit: Payer: Self-pay | Admitting: Internal Medicine

## 2015-04-28 ENCOUNTER — Other Ambulatory Visit

## 2015-04-28 DIAGNOSIS — R5383 Other fatigue: Secondary | ICD-10-CM

## 2015-04-28 DIAGNOSIS — E559 Vitamin D deficiency, unspecified: Secondary | ICD-10-CM

## 2015-04-28 DIAGNOSIS — E538 Deficiency of other specified B group vitamins: Secondary | ICD-10-CM

## 2015-04-28 LAB — VITAMIN B12: Vitamin B-12: 281 pg/mL (ref 211–911)

## 2015-04-29 LAB — VITAMIN D 25 HYDROXY (VIT D DEFICIENCY, FRACTURES): Vit D, 25-Hydroxy: 60 ng/mL (ref 30–100)

## 2015-09-29 ENCOUNTER — Ambulatory Visit (INDEPENDENT_AMBULATORY_CARE_PROVIDER_SITE_OTHER): Admitting: Internal Medicine

## 2015-09-29 ENCOUNTER — Encounter: Payer: Self-pay | Admitting: Internal Medicine

## 2015-09-29 VITALS — BP 132/68 | HR 86 | Temp 98.4°F | Resp 18 | Ht 70.0 in | Wt 168.0 lb

## 2015-09-29 DIAGNOSIS — E782 Mixed hyperlipidemia: Secondary | ICD-10-CM

## 2015-09-29 DIAGNOSIS — I10 Essential (primary) hypertension: Secondary | ICD-10-CM | POA: Diagnosis not present

## 2015-09-29 DIAGNOSIS — Z79899 Other long term (current) drug therapy: Secondary | ICD-10-CM

## 2015-09-29 DIAGNOSIS — R7309 Other abnormal glucose: Secondary | ICD-10-CM

## 2015-09-29 DIAGNOSIS — E559 Vitamin D deficiency, unspecified: Secondary | ICD-10-CM

## 2015-09-29 NOTE — Progress Notes (Signed)
Patient ID: Jacqualine Mau, female   DOB: July 31, 1951, 65 y.o.   MRN: [id number]  Assessment and Plan:  Hypertension:  -Continue medication,  -monitor blood pressure at home.  -Continue DASH diet.   -Reminder to go to the ER if any CP, SOB, nausea, dizziness, severe HA, changes vision/speech, left arm numbness and tingling, and jaw pain.  Cholesterol: -Continue diet and exercise.  -Check cholesterol.   Pre-diabetes: -Continue diet and exercise.  -Check A1C  Vitamin D Def: -check level -continue medications.   Continue diet and meds as discussed. Further disposition pending results of labs.  HPI 65 y.o. female  presents for 3 month follow up with hypertension, hyperlipidemia, prediabetes and vitamin D.   Her blood pressure has been controlled at home, today their BP is BP: 132/68 mmHg.   She does workout. She denies chest pain, shortness of breath, dizziness.   She is not on cholesterol medication and denies myalgias. Her cholesterol is at goal. The cholesterol last visit was:   Lab Results  Component Value Date   CHOL 219* 03/24/2015   HDL 54 03/24/2015   LDLCALC 125* 03/24/2015   TRIG 199* 03/24/2015   CHOLHDL 4.1 03/24/2015     She has been working on diet and exercise for prediabetes, and denies foot ulcerations, hyperglycemia, hypoglycemia , increased appetite, nausea, paresthesia of the feet, polydipsia, polyuria, visual disturbances, vomiting and weight loss. Last A1C in the office was:  Lab Results  Component Value Date   HGBA1C 5.1 03/24/2015    Patient is on Vitamin D supplement.  Lab Results  Component Value Date   VD25OH 60 04/28/2015      Current Medications:  Current Outpatient Prescriptions on File Prior to Visit  Medication Sig Dispense Refill  . aspirin EC 81 MG tablet Take 81 mg by mouth daily.    Marland Kitchen CALCIUM PO Take 1 tablet by mouth 3 (three) times a week. No specific days.    . Cholecalciferol (VITAMIN D PO) Take 5,000 Units by mouth 3 (three)  times a week. No specific days.    . Cyanocobalamin (VITAMIN B 12 PO) Take 1 tablet by mouth 3 (three) times a week. No specific days.    . hyoscyamine (LEVSIN SL) 0.125 MG SL tablet Place 1 tablet (0.125 mg total) under the tongue every 4 (four) hours as needed. 30 tablet 99  . sertraline (ZOLOFT) 100 MG tablet TAKE 1 TABLET BY MOUTH ONCE A DAY FOR MOOD 90 tablet 1   No current facility-administered medications on file prior to visit.    Medical History:  Past Medical History  Diagnosis Date  . High cholesterol   . Hypertension   . IBS (irritable bowel syndrome)   . B12 deficiency   . Vitamin D deficiency     Allergies:  Allergies  Allergen Reactions  . Lipitor [Atorvastatin] Other (See Comments)    Fatigue.     Review of Systems:  Review of Systems  Constitutional: Negative for fever, chills and malaise/fatigue.  HENT: Positive for congestion. Negative for ear pain and sore throat.   Respiratory: Negative for cough, shortness of breath and wheezing.   Cardiovascular: Negative for chest pain, palpitations and leg swelling.  Gastrointestinal: Negative for heartburn, nausea, vomiting, diarrhea, constipation, blood in stool and melena.  Genitourinary: Positive for urgency. Negative for dysuria, frequency and hematuria.  Skin: Negative.   Neurological: Negative for dizziness, sensory change, loss of consciousness and headaches.  Psychiatric/Behavioral: Negative for depression. The patient is not nervous/anxious  and does not have insomnia.     Family history- Review and unchanged  Social history- Review and unchanged  Physical Exam: BP 132/68 mmHg  Pulse 86  Temp(Src) 98.4 F (36.9 C) (Temporal)  Resp 18  Ht 5\' 10"  (1.778 m)  Wt 168 lb (76.204 kg)  BMI 24.11 kg/m2 Wt Readings from Last 3 Encounters:  09/29/15 168 lb (76.204 kg)  03/24/15 166 lb 3.2 oz (75.388 kg)  07/21/14 176 lb (79.833 kg)    General Appearance: Well nourished well developed, in no apparent  distress. Eyes: PERRLA, EOMs, conjunctiva no swelling or erythema ENT/Mouth: Ear canals normal without obstruction, swelling, erythma, discharge.  TMs normal bilaterally.  Oropharynx moist, clear, without exudate, or postoropharyngeal swelling. Neck: Supple, thyroid normal,no cervical adenopathy  Respiratory: Respiratory effort normal, Breath sounds clear A&P without rhonchi, wheeze, or rale.  No retractions, no accessory usage. Cardio: RRR with no MRGs. Brisk peripheral pulses without edema.  Abdomen: Soft, + BS,  Non tender, no guarding, rebound, hernias, masses. Musculoskeletal: Full ROM, 5/5 strength, Normal gait Skin: Warm, dry without rashes, lesions, ecchymosis.  Neuro: Awake and oriented X 3, Cranial nerves intact. Normal muscle tone, no cerebellar symptoms. Psych: Normal affect, Insight and Judgment appropriate.    Starlyn Skeans, PA-C 4:52 PM [facility]

## 2016-01-14 ENCOUNTER — Encounter: Payer: Self-pay | Admitting: Gastroenterology

## 2016-04-18 ENCOUNTER — Encounter: Payer: Self-pay | Admitting: Internal Medicine

## 2016-06-01 ENCOUNTER — Ambulatory Visit (INDEPENDENT_AMBULATORY_CARE_PROVIDER_SITE_OTHER): Admitting: Internal Medicine

## 2016-06-01 VITALS — BP 122/82 | HR 76 | Temp 97.1°F | Resp 16 | Ht 70.0 in | Wt 176.0 lb

## 2016-06-01 DIAGNOSIS — E782 Mixed hyperlipidemia: Secondary | ICD-10-CM

## 2016-06-01 DIAGNOSIS — I10 Essential (primary) hypertension: Secondary | ICD-10-CM

## 2016-06-01 DIAGNOSIS — Z0000 Encounter for general adult medical examination without abnormal findings: Secondary | ICD-10-CM | POA: Diagnosis not present

## 2016-06-01 DIAGNOSIS — Z136 Encounter for screening for cardiovascular disorders: Secondary | ICD-10-CM

## 2016-06-01 DIAGNOSIS — R5383 Other fatigue: Secondary | ICD-10-CM

## 2016-06-01 DIAGNOSIS — Z79899 Other long term (current) drug therapy: Secondary | ICD-10-CM | POA: Diagnosis not present

## 2016-06-01 DIAGNOSIS — Z111 Encounter for screening for respiratory tuberculosis: Secondary | ICD-10-CM

## 2016-06-01 DIAGNOSIS — Z23 Encounter for immunization: Secondary | ICD-10-CM | POA: Diagnosis not present

## 2016-06-01 DIAGNOSIS — R7309 Other abnormal glucose: Secondary | ICD-10-CM

## 2016-06-01 DIAGNOSIS — Z1212 Encounter for screening for malignant neoplasm of rectum: Secondary | ICD-10-CM

## 2016-06-01 DIAGNOSIS — Z0001 Encounter for general adult medical examination with abnormal findings: Secondary | ICD-10-CM

## 2016-06-01 DIAGNOSIS — E559 Vitamin D deficiency, unspecified: Secondary | ICD-10-CM | POA: Diagnosis not present

## 2016-06-01 LAB — CBC WITH DIFFERENTIAL/PLATELET
Basophils Absolute: 0 cells/uL (ref 0–200)
Basophils Relative: 0 %
Eosinophils Absolute: 92 cells/uL (ref 15–500)
Eosinophils Relative: 2 %
HCT: 39.5 % (ref 35.0–45.0)
Hemoglobin: 13.5 g/dL (ref 11.7–15.5)
Lymphocytes Relative: 34 %
Lymphs Abs: 1564 cells/uL (ref 850–3900)
MCH: 33.5 pg — ABNORMAL HIGH (ref 27.0–33.0)
MCHC: 34.2 g/dL (ref 32.0–36.0)
MCV: 98 fL (ref 80.0–100.0)
MPV: 10 fL (ref 7.5–12.5)
Monocytes Absolute: 598 cells/uL (ref 200–950)
Monocytes Relative: 13 %
Neutro Abs: 2346 cells/uL (ref 1500–7800)
Neutrophils Relative %: 51 %
Platelets: 217 10*3/uL (ref 140–400)
RBC: 4.03 MIL/uL (ref 3.80–5.10)
RDW: 12.9 % (ref 11.0–15.0)
WBC: 4.6 10*3/uL (ref 3.8–10.8)

## 2016-06-01 NOTE — Progress Notes (Signed)
[facility] Unk Pinto, M.D.    Uvaldo Bristle. Silverio Lay, P.A.-C      Starlyn Skeans, P.A.-C  [facility]                [address] [place] [id number] Telephone [phone] Telefax [phone]  Annual Screening/Preventative Visit & Comprehensive Evaluation &  Examination     This very nice 65 y.o.  [facility] presents for a Screening/Preventative Visit & comprehensive evaluation and management of multiple medical co-morbidities.  Patient has been followed for HTN, Prediabetes, Hyperlipidemia and Vitamin D Deficiency.      HTN predates circa 2005. Patient's BP has been controlled at home and patient denies any cardiac symptoms as chest pain, palpitations, shortness of breath, dizziness or ankle swelling. In 2010 she had a negative/normal Cardiolyte Today's BP is 122/8.      Patient's hyperlipidemia is not controlled with diet and she reports intolerance to Statins.. Patient denies myalgias or other medication SE's. Last lipids were not at goal: Lab Results  Component Value Date   CHOL 215 (H) 06/01/2016   HDL 63 06/01/2016   LDLCALC 127 06/01/2016   TRIG 125 06/01/2016   CHOLHDL 3.4 06/01/2016      Patient is screened proactively for prediabetes and patient denies reactive hypoglycemic symptoms, visual blurring, diabetic polys, or paresthesias. Last A1c was at goal: Lab Results  Component Value Date   HGBA1C 4.7 06/01/2016      Finally, patient has history of Vitamin D Deficiency in 2008 of "14" and last Vitamin D was still very low: Lab Results  Component Value Date   VD25OH 35 06/01/2016   Current Outpatient Prescriptions on File Prior to Visit  Medication Sig  . aspirin EC 81 MG tablet Take 81 mg by mouth daily.  Marland Kitchen CALCIUM PO Take 1 tablet by mouth 3 (three) times a week. No specific days.  . Cholecalciferol (VITAMIN D PO) Take 5,000 Units by mouth 3 (three) times a week. No specific  days.  . Cyanocobalamin (VITAMIN B 12 PO) Take 1 tablet by mouth 3 (three) times a week. No specific days.  . hyoscyamine (LEVSIN SL) 0.125 MG SL tablet Place 1 tablet (0.125 mg total) under the tongue every 4 (four) hours as needed.   No current facility-administered medications on file prior to visit.    Allergies  Allergen Reactions  . Lipitor [Atorvastatin] Other (See Comments)    Fatigue.   Past Medical History:  Diagnosis Date  . B12 deficiency   . High cholesterol   . Hypertension   . IBS (irritable bowel syndrome)   . Vitamin D deficiency    Health Maintenance  Topic Date Due  . PAP SMEAR  07/07/1972  . MAMMOGRAM  01/25/2013  . INFLUENZA VACCINE  04/05/2016  . COLONOSCOPY  03/30/2020  . TETANUS/TDAP  01/25/2023  . ZOSTAVAX  Completed  . Hepatitis C Screening  Completed  . HIV Screening  Completed   Immunization History  Administered Date(s) Administered  . PPD Test 03/19/2014, 03/24/2015, 06/01/2016  . Pneumococcal Polysaccharide-23 06/01/2016  . Tdap 01/24/2013  . Zoster 11/03/2013   Past Surgical History:  Procedure Laterality Date  . TUBAL LIGATION     Family History  Problem Relation Age of Onset  . Heart failure Mother   . Osteoarthritis Father   . Hypertension Father   . COPD Father  Social History  Substance Use Topics  . Smoking status: Current Some Day Smoker    Packs/day: 1.00    Types: Cigarettes  . Smokeless tobacco: Not on file  . Alcohol use Yes     Comment: occasionally    ROS Constitutional: Denies fever, chills, weight loss/gain, headaches, insomnia,  night sweats, and change in appetite. Does c/o fatigue. Eyes: Denies redness, blurred vision, diplopia, discharge, itchy, watery eyes.  ENT: Denies discharge, congestion, post nasal drip, epistaxis, sore throat, earache, hearing loss, dental pain, Tinnitus, Vertigo, Sinus pain, snoring.  Cardio: Denies chest pain, palpitations, irregular heartbeat, syncope, dyspnea, diaphoresis,  orthopnea, PND, claudication, edema Respiratory: denies cough, dyspnea, DOE, pleurisy, hoarseness, laryngitis, wheezing.  Gastrointestinal: Denies dysphagia, heartburn, reflux, water brash, pain, cramps, nausea, vomiting, bloating, diarrhea, constipation, hematemesis, melena, hematochezia, jaundice, hemorrhoids Genitourinary: Denies dysuria, frequency, urgency, nocturia, hesitancy, discharge, hematuria, flank pain Breast: Breast lumps, nipple discharge, bleeding.  Musculoskeletal: Denies arthralgia, myalgia, stiffness, Jt. Swelling, pain, limp, and strain/sprain. Denies falls. Skin: Denies puritis, rash, hives, warts, acne, eczema, changing in skin lesion Neuro: No weakness, tremor, incoordination, spasms, paresthesia, pain Psychiatric: Denies confusion, memory loss, sensory loss. Denies Depression. Endocrine: Denies change in weight, skin, hair change, nocturia, and paresthesia, diabetic polys, visual blurring, hyper / hypo glycemic episodes.  Heme/Lymph: No excessive bleeding, bruising, enlarged lymph nodes.  Physical Exam  BP 122/82   Pulse 76   Temp 97.1 F (36.2 C)   Resp 16   Ht 5\' 10"  (1.778 m)   Wt 176 lb (79.8 kg)   BMI 25.25 kg/m   General Appearance: Well nourished and in no apparent distress.  Eyes: PERRLA, EOMs, conjunctiva no swelling or erythema, normal fundi and vessels. Sinuses: No frontal/maxillary tenderness ENT/Mouth: EACs patent / TMs  nl. Nares clear without erythema, swelling, mucoid exudates. Oral hygiene is good. No erythema, swelling, or exudate. Tongue normal, non-obstructing. Tonsils not swollen or erythematous. Hearing normal.  Neck: Supple, thyroid normal. No bruits, nodes or JVD. Respiratory: Respiratory effort normal.  BS equal and clear bilateral without rales, rhonci, wheezing or stridor. Cardio: Heart sounds are normal with regular rate and rhythm and no murmurs, rubs or gallops. Peripheral pulses are normal and equal bilaterally without edema. No  aortic or femoral bruits. Chest: symmetric with normal excursions and percussion. Breasts: Symmetric, without lumps, nipple discharge, retractions, or fibrocystic changes.  Abdomen: Flat, soft with bowel sounds active. Nontender, no guarding, rebound, hernias, masses, or organomegaly.  Lymphatics: Non tender without lymphadenopathy.  Genitourinary:  Musculoskeletal: Full ROM all peripheral extremities, joint stability, 5/5 strength, and normal gait. Skin: Warm and dry without rashes, lesions, cyanosis, clubbing or  ecchymosis.  Neuro: Cranial nerves intact, reflexes equal bilaterally. Normal muscle tone, no cerebellar symptoms. Sensation intact.  Pysch: Alert and oriented X 3, normal affect, Insight and Judgment appropriate.   Assessment and Plan  1. Annual Preventative Screening Examination  - Microalbumin / creatinine urine ratio - EKG 12-Lead - POC Hemoccult Bld/Stl - Urinalysis, Routine w reflex microscopic - [place], RETROPERITNL ABD,  LTD - Vitamin B12 - Iron and TIBC - CBC with Differential/Platelet - BASIC METABOLIC PANEL WITH GFR - Hepatic function panel - Magnesium - Lipid panel - TSH - Hemoglobin A1c - Insulin, random - VITAMIN D 25 Hydroxy  2. Essential hypertension  - Microalbumin / creatinine urine ratio - EKG 12-Lead - [place], RETROPERITNL ABD,  LTD - TSH  3. Mixed hyperlipidemia  - [place], RETROPERITNL ABD,  LTD - Lipid panel - TSH  4. Abnormal  glucose  - [place], RETROPERITNL ABD,  LTD - Hemoglobin A1c - Insulin, random  5. Vitamin D deficiency  - VITAMIN D 25 Hydroxy   6. Screening for rectal cancer  - POC Hemoccult Bld/Stl   7. Screening examination for pulmonary tuberculosis  - PPD  8. Screening for ischemic heart disease  - [place], RETROPERITNL ABD,  LTD  9. Other fatigue  - Vitamin B12 - Iron and TIBC - CBC with Differential/Platelet - TSH  10. Medication management  - Urinalysis, Routine w reflex microscopic  - CBC with  Differential/Platelet - BASIC METABOLIC PANEL WITH GFR - Hepatic function panel - Magnesium  11. Need for prophylactic vaccination against Streptococcus pneumoniae (pneumococcus)  - Pneumococcal polysaccharide vaccine 23-valent greater than or equal to 2yo subcutaneous/IM      Continue prudent diet as discussed, weight control, BP monitoring, regular exercise, and medications. Discussed med's effects and SE's. Screening labs and tests as requested with regular follow-up as recommended. Over 40 minutes of exam, counseling, chart review and high complex critical decision making was performed.

## 2016-06-01 NOTE — Patient Instructions (Signed)

## 2016-06-02 LAB — HEPATIC FUNCTION PANEL
ALT: 12 U/L (ref 6–29)
AST: 16 U/L (ref 10–35)
Albumin: 3.8 g/dL (ref 3.6–5.1)
Alkaline Phosphatase: 40 U/L (ref 33–130)
Bilirubin, Direct: 0.1 mg/dL (ref <=0.2)
Indirect Bilirubin: 0.3 mg/dL (ref 0.2–1.2)
Total Bilirubin: 0.4 mg/dL (ref 0.2–1.2)
Total Protein: 6.1 g/dL (ref 6.1–8.1)

## 2016-06-02 LAB — URINALYSIS, ROUTINE W REFLEX MICROSCOPIC
Bilirubin Urine: NEGATIVE
Glucose, UA: NEGATIVE
Hgb urine dipstick: NEGATIVE
Ketones, ur: NEGATIVE
Nitrite: NEGATIVE
Protein, ur: NEGATIVE
Specific Gravity, Urine: 1.006 (ref 1.001–1.035)
pH: 5.5 (ref 5.0–8.0)

## 2016-06-02 LAB — VITAMIN B12: Vitamin B-12: 272 pg/mL (ref 200–1100)

## 2016-06-02 LAB — BASIC METABOLIC PANEL WITH GFR
BUN: 12 mg/dL (ref 7–25)
CO2: 25 mmol/L (ref 20–31)
Calcium: 9.1 mg/dL (ref 8.6–10.4)
Chloride: 105 mmol/L (ref 98–110)
Creat: 0.84 mg/dL (ref 0.50–0.99)
GFR, Est African American: 85 mL/min (ref >=60)
GFR, Est Non African American: 74 mL/min (ref >=60)
Glucose, Bld: 105 mg/dL — ABNORMAL HIGH (ref 65–99)
Potassium: 4.1 mmol/L (ref 3.5–5.3)
Sodium: 142 mmol/L (ref 135–146)

## 2016-06-02 LAB — TSH: TSH: 0.79 mIU/L

## 2016-06-02 LAB — LIPID PANEL
Cholesterol: 215 mg/dL — ABNORMAL HIGH (ref 125–200)
HDL: 63 mg/dL (ref >=46)
LDL Cholesterol: 127 mg/dL (ref <130)
Total CHOL/HDL Ratio: 3.4 Ratio (ref <=5.0)
Triglycerides: 125 mg/dL (ref <150)
VLDL: 25 mg/dL (ref <30)

## 2016-06-02 LAB — MICROALBUMIN / CREATININE URINE RATIO
Creatinine, Urine: 52 mg/dL (ref 20–320)
Microalb, Ur: <0.2 mg/dL

## 2016-06-02 LAB — URINALYSIS, MICROSCOPIC ONLY
Bacteria, UA: NONE SEEN [HPF]
Casts: NONE SEEN [LPF]
Crystals: NONE SEEN [HPF]
RBC / HPF: NONE SEEN RBC/HPF (ref <=2)
Squamous Epithelial / LPF: NONE SEEN [HPF] (ref <=5)
Yeast: NONE SEEN [HPF]

## 2016-06-02 LAB — IRON AND TIBC
%SAT: 30 % (ref 11–50)
Iron: 80 ug/dL (ref 45–160)
TIBC: 265 ug/dL (ref 250–450)
UIBC: 185 ug/dL (ref 125–400)

## 2016-06-02 LAB — VITAMIN D 25 HYDROXY (VIT D DEFICIENCY, FRACTURES): Vit D, 25-Hydroxy: 35 ng/mL (ref 30–100)

## 2016-06-02 LAB — INSULIN, RANDOM: Insulin: 27.1 u[IU]/mL — ABNORMAL HIGH (ref 2.0–19.6)

## 2016-06-02 LAB — MAGNESIUM: Magnesium: 2 mg/dL (ref 1.5–2.5)

## 2016-06-02 LAB — HEMOGLOBIN A1C
Hgb A1c MFr Bld: 4.7 % (ref <5.7)
Mean Plasma Glucose: 88 mg/dL

## 2016-06-05 ENCOUNTER — Encounter: Payer: Self-pay | Admitting: Internal Medicine

## 2016-07-11 ENCOUNTER — Encounter: Payer: Self-pay | Admitting: Internal Medicine

## 2016-09-16 ENCOUNTER — Other Ambulatory Visit: Payer: Self-pay | Admitting: *Deleted

## 2016-09-16 MED ORDER — MIRABEGRON ER 50 MG PO TB24: 50.0000 mg | ORAL_TABLET | Freq: Every day | ORAL | 1 refills | Status: DC

## 2016-10-13 DIAGNOSIS — H25813 Combined forms of age-related cataract, bilateral: Secondary | ICD-10-CM | POA: Diagnosis not present

## 2016-11-18 DIAGNOSIS — R0789 Other chest pain: Secondary | ICD-10-CM | POA: Diagnosis not present

## 2016-11-22 DIAGNOSIS — H02839 Dermatochalasis of unspecified eye, unspecified eyelid: Secondary | ICD-10-CM | POA: Diagnosis not present

## 2016-11-22 DIAGNOSIS — H25013 Cortical age-related cataract, bilateral: Secondary | ICD-10-CM | POA: Diagnosis not present

## 2016-11-22 DIAGNOSIS — H2511 Age-related nuclear cataract, right eye: Secondary | ICD-10-CM | POA: Diagnosis not present

## 2016-11-22 DIAGNOSIS — H2513 Age-related nuclear cataract, bilateral: Secondary | ICD-10-CM | POA: Diagnosis not present

## 2016-11-22 DIAGNOSIS — H18413 Arcus senilis, bilateral: Secondary | ICD-10-CM | POA: Diagnosis not present

## 2016-12-19 DIAGNOSIS — H2511 Age-related nuclear cataract, right eye: Secondary | ICD-10-CM | POA: Diagnosis not present

## 2016-12-19 DIAGNOSIS — H25811 Combined forms of age-related cataract, right eye: Secondary | ICD-10-CM | POA: Diagnosis not present

## 2016-12-20 DIAGNOSIS — H2512 Age-related nuclear cataract, left eye: Secondary | ICD-10-CM | POA: Diagnosis not present

## 2017-01-09 DIAGNOSIS — H25812 Combined forms of age-related cataract, left eye: Secondary | ICD-10-CM | POA: Diagnosis not present

## 2017-01-09 DIAGNOSIS — H2512 Age-related nuclear cataract, left eye: Secondary | ICD-10-CM | POA: Diagnosis not present

## 2017-02-01 DIAGNOSIS — Z961 Presence of intraocular lens: Secondary | ICD-10-CM | POA: Diagnosis not present

## 2017-04-27 ENCOUNTER — Other Ambulatory Visit: Payer: Self-pay | Admitting: Internal Medicine

## 2017-04-27 DIAGNOSIS — F341 Dysthymic disorder: Secondary | ICD-10-CM

## 2017-04-27 DIAGNOSIS — N3281 Overactive bladder: Secondary | ICD-10-CM

## 2017-04-27 MED ORDER — OXYBUTYNIN CHLORIDE 5 MG PO TABS: ORAL_TABLET | ORAL | 1 refills | Status: AC

## 2017-04-27 MED ORDER — SERTRALINE HCL 100 MG PO TABS: ORAL_TABLET | ORAL | 3 refills | Status: DC

## 2017-05-29 ENCOUNTER — Encounter: Payer: Self-pay | Admitting: Internal Medicine

## 2017-07-24 ENCOUNTER — Other Ambulatory Visit: Payer: Self-pay | Admitting: *Deleted

## 2017-07-24 ENCOUNTER — Ambulatory Visit (INDEPENDENT_AMBULATORY_CARE_PROVIDER_SITE_OTHER): Payer: Medicare Other | Admitting: Internal Medicine

## 2017-07-24 ENCOUNTER — Encounter: Payer: Self-pay | Admitting: Internal Medicine

## 2017-07-24 VITALS — BP 122/76 | HR 76 | Temp 97.4°F | Resp 16 | Ht 71.0 in | Wt 176.8 lb

## 2017-07-24 DIAGNOSIS — Z0001 Encounter for general adult medical examination with abnormal findings: Secondary | ICD-10-CM

## 2017-07-24 DIAGNOSIS — E559 Vitamin D deficiency, unspecified: Secondary | ICD-10-CM

## 2017-07-24 DIAGNOSIS — E782 Mixed hyperlipidemia: Secondary | ICD-10-CM

## 2017-07-24 DIAGNOSIS — Z23 Encounter for immunization: Secondary | ICD-10-CM

## 2017-07-24 DIAGNOSIS — Z79899 Other long term (current) drug therapy: Secondary | ICD-10-CM | POA: Diagnosis not present

## 2017-07-24 DIAGNOSIS — K589 Irritable bowel syndrome without diarrhea: Secondary | ICD-10-CM

## 2017-07-24 DIAGNOSIS — R7309 Other abnormal glucose: Secondary | ICD-10-CM | POA: Diagnosis not present

## 2017-07-24 DIAGNOSIS — I10 Essential (primary) hypertension: Secondary | ICD-10-CM | POA: Diagnosis not present

## 2017-07-24 DIAGNOSIS — Z136 Encounter for screening for cardiovascular disorders: Secondary | ICD-10-CM | POA: Diagnosis not present

## 2017-07-24 DIAGNOSIS — Z1212 Encounter for screening for malignant neoplasm of rectum: Secondary | ICD-10-CM

## 2017-07-24 DIAGNOSIS — Z1211 Encounter for screening for malignant neoplasm of colon: Secondary | ICD-10-CM

## 2017-07-24 MED ORDER — HYOSCYAMINE SULFATE 0.125 MG SL SUBL: 0.1250 mg | SUBLINGUAL_TABLET | SUBLINGUAL | 1 refills | Status: DC | PRN

## 2017-07-24 NOTE — Patient Instructions (Signed)

## 2017-07-24 NOTE — Progress Notes (Signed)
[facility] Unk Pinto, M.D.     Uvaldo Bristle. Silverio Lay, P.A.-C Liane Comber, [place] [address] [place], [place] [address] Telephone [phone] Telefax [phone] Annual Screening/Preventative Visit & Comprehensive Evaluation &  Examination     This very nice 66 y.o. [facility] presents for a Screening/Preventative Visit & comprehensive evaluation and management of multiple medical co-morbidities.  Patient has been followed for HTN, Prediabetes, Hyperlipidemia and Vitamin D Deficiency. She also has hx/oGERD controlled with her meds.       patient is followed expectantly for labile HTN predates since 2005. She had a negative Cardiolite in 2010Patient's BP has been controlled at home and patient denies any cardiac symptoms as chest pain, palpitations, shortness of breath, dizziness or ankle swelling. Today's BP is at goal -  122/76.     Patient's hyperlipidemia is not controlled with diet and patient has hx/o intolerance to statins & gemfibrozil. Patient denies myalgias or other medication SE's. Last lipids were not at goal.  Lab Results  Component Value Date   CHOL 215 (H) 06/01/2016   HDL 63 06/01/2016   LDLCALC 127 06/01/2016   TRIG 125 06/01/2016   CHOLHDL 3.4 06/01/2016      Patient is monitored proactively for prediabetes and patient denies reactive hypoglycemic symptoms, visual blurring, diabetic polys, or paresthesias. Last A1c was at goal: Lab Results  Component Value Date   HGBA1C 4.7 06/01/2016      Finally, patient has history of Vitamin D Deficiency ("10" / 2008) and last Vitamin D was still very low: Lab Results  Component Value Date   VD25OH 35 06/01/2016   Current Outpatient Medications on File Prior to Visit  Medication Sig  . aspirin EC 81 MG tablet Take 81 mg by mouth daily.  Marland Kitchen CALCIUM PO Take 1 tablet by mouth 3 (three) times a week. No specific days.  . Cholecalciferol (VITAMIN D  PO) Take 5,000 Units by mouth 3 (three) times a week. No specific days.  Marland Kitchen omeprazole (PRILOSEC) 40 MG capsule Take 40 mg 2 (two) times daily by mouth.  . oxybutynin (DITROPAN) 5 MG tablet Take 1/2 to 1 tablet 2 to 3 x / day as needed for bladder  . sertraline (ZOLOFT) 100 MG tablet Take1 tablet  Daily for Mood   No current facility-administered medications on file prior to visit.    Allergies  Allergen Reactions  . Lipitor [Atorvastatin] Other (See Comments)    Fatigue.   Past Medical History:  Diagnosis Date  . B12 deficiency   . High cholesterol   . Hypertension   . IBS (irritable bowel syndrome)   . Vitamin D deficiency    Health Maintenance  Topic Date Due  . MAMMOGRAM  01/25/2013  . DEXA SCAN  07/07/2016  . INFLUENZA VACCINE  04/05/2017  . PNA vac Low Risk Adult (1 of 2 - PCV13) 06/01/2017  . COLONOSCOPY  03/30/2020  . TETANUS/TDAP  01/25/2023  . Hepatitis C Screening  Completed   Immunization History  Administered Date(s) Administered  . PPD Test 03/19/2014, 03/24/2015, 06/01/2016  . Pneumococcal Polysaccharide-23 06/01/2016  . Tdap 01/24/2013  . Zoster 11/03/2013   Past Surgical History:  Procedure Laterality Date  . TUBAL LIGATION     Family History  Problem Relation Age of Onset  . Heart failure Mother   . Osteoarthritis Father   . Hypertension Father   . COPD Father    Social History   Tobacco Use  .  Smoking status: Current Some Day Smoker    Packs/day: 1.00    Types: Cigarettes  Substance Use Topics  . Alcohol use: Yes    Comment: occasionally  . Drug use: No    ROS Constitutional: Denies fever, chills, weight loss/gain, headaches, insomnia,  night sweats, and change in appetite. Does c/o fatigue. Eyes: Denies redness, blurred vision, diplopia, discharge, itchy, watery eyes.  ENT: Denies discharge, congestion, post nasal drip, epistaxis, sore throat, earache, hearing loss, dental pain, Tinnitus, Vertigo, Sinus pain, snoring.  Cardio: Denies  chest pain, palpitations, irregular heartbeat, syncope, dyspnea, diaphoresis, orthopnea, PND, claudication, edema Respiratory: denies cough, dyspnea, DOE, pleurisy, hoarseness, laryngitis, wheezing.  Gastrointestinal: Denies dysphagia, heartburn, reflux, water brash, pain, cramps, nausea, vomiting, bloating, diarrhea, constipation, hematemesis, melena, hematochezia, jaundice, hemorrhoids Genitourinary: Denies dysuria, frequency, urgency, nocturia, hesitancy, discharge, hematuria, flank pain Breast: Breast lumps, nipple discharge, bleeding.  Musculoskeletal: Denies arthralgia, myalgia, stiffness, Jt. Swelling, pain, limp, and strain/sprain. Denies falls. Skin: Denies puritis, rash, hives, warts, acne, eczema, changing in skin lesion Neuro: No weakness, tremor, incoordination, spasms, paresthesia, pain Psychiatric: Denies confusion, memory loss, sensory loss. Denies Depression. Endocrine: Denies change in weight, skin, hair change, nocturia, and paresthesia, diabetic polys, visual blurring, hyper / hypo glycemic episodes.  Heme/Lymph: No excessive bleeding, bruising, enlarged lymph nodes.  Physical Exam  BP 122/76   Pulse 76   Temp (!) 97.4 F (36.3 C)   Resp 16   Ht 5\' 11"  (1.803 m)   Wt 176 lb 12.8 oz (80.2 kg)   BMI 24.66 kg/m   General Appearance: Well nourished, well groomed and in no apparent distress.  Eyes: PERRLA, EOMs, conjunctiva no swelling or erythema, normal fundi and vessels. Sinuses: No frontal/maxillary tenderness ENT/Mouth: EACs patent / TMs  nl. Nares clear without erythema, swelling, mucoid exudates. Oral hygiene is good. No erythema, swelling, or exudate. Tongue normal, non-obstructing. Tonsils not swollen or erythematous. Hearing normal.  Neck: Supple, thyroid normal. No bruits, nodes or JVD. Respiratory: Respiratory effort normal.  BS equal and clear bilateral without rales, rhonci, wheezing or stridor. Cardio: Heart sounds are normal with regular rate and rhythm  and no murmurs, rubs or gallops. Peripheral pulses are normal and equal bilaterally without edema. No aortic or femoral bruits. Chest: symmetric with normal excursions and percussion. Breasts: Symmetric, without lumps, nipple discharge, retractions, or fibrocystic changes.  Abdomen: Flat, soft with bowel sounds active. Nontender, no guarding, rebound, hernias, masses, or organomegaly.  Lymphatics: Non tender without lymphadenopathy.  Musculoskeletal: Full ROM all peripheral extremities, joint stability, 5/5 strength, and normal gait. Skin: Warm and dry without rashes, lesions, cyanosis, clubbing or  ecchymosis.  Neuro: Cranial nerves intact, reflexes equal bilaterally. Normal muscle tone, no cerebellar symptoms. Sensation intact.  Pysch: Alert and oriented X 3, normal affect, Insight and Judgment appropriate.   Assessment and Plan  1. Annual Preventative Screening Examination  2. Essential hypertension  - EKG 12-Lead - Urinalysis, Routine w reflex microscopic - CBC with Differential/Platelet - BASIC METABOLIC PANEL WITH GFR - Magnesium - TSH  3. Hyperlipidemia, mixed  - EKG 12-Lead - Hepatic function panel - Lipid panel - TSH  4. Abnormal glucose  - Hemoglobin A1c - Insulin, random  5. Vitamin D deficiency  - VITAMIN D 25 Hydroxy   6. Screening for colorectal cancer  - POC Hemoccult Bld/Stl   7. Screening for ischemic heart disease  - EKG 12-Lead  8. Medication management  - Urinalysis, Routine w reflex microscopic - Microalbumin / creatinine urine ratio - CBC  with Differential/Platelet - BASIC METABOLIC PANEL WITH GFR - Hepatic function panel - Magnesium - Lipid panel - TSH - Hemoglobin A1c - Insulin, random - VITAMIN D 25 Hydroxy  9. Need for prophylactic vaccination against Streptococcus pneumoniae (pneumococcus)  - Pneumococcal conjugate vaccine 13-valent  10. Mixed hyperlipidemia        Patient was counseled in prudent diet to achieve/maintain  BMI less than 25 for weight control, BP monitoring, regular exercise and medications. Discussed med's effects and SE's. Screening labs and tests as requested with regular follow-up as recommended. Over 40 minutes of exam, counseling, chart review and high complex critical decision making was performed.

## 2017-07-25 ENCOUNTER — Other Ambulatory Visit: Payer: Self-pay | Admitting: Internal Medicine

## 2017-07-25 DIAGNOSIS — E782 Mixed hyperlipidemia: Secondary | ICD-10-CM

## 2017-07-25 LAB — URINALYSIS, ROUTINE W REFLEX MICROSCOPIC
Bacteria, UA: NONE SEEN /HPF
Bilirubin Urine: NEGATIVE
Glucose, UA: NEGATIVE
Hgb urine dipstick: NEGATIVE
Hyaline Cast: NONE SEEN /LPF
Ketones, ur: NEGATIVE
Nitrite: NEGATIVE
Protein, ur: NEGATIVE
Specific Gravity, Urine: 1.013 (ref 1.001–1.03)
pH: 5.5 (ref 5.0–8.0)

## 2017-07-25 LAB — HEMOGLOBIN A1C
Hgb A1c MFr Bld: 4.9 % of total Hgb (ref <5.7)
Mean Plasma Glucose: 94 (calc)
eAG (mmol/L): 5.2 (calc)

## 2017-07-25 LAB — HEPATIC FUNCTION PANEL
AG Ratio: 1.6 (calc) (ref 1.0–2.5)
ALT: 9 U/L (ref 6–29)
AST: 3 U/L — ABNORMAL LOW (ref 10–35)
Albumin: 4.1 g/dL (ref 3.6–5.1)
Alkaline phosphatase (APISO): 49 U/L (ref 33–130)
Bilirubin, Direct: 0.1 mg/dL (ref 0.0–0.2)
Globulin: 2.6 g/dL (calc) (ref 1.9–3.7)
Indirect Bilirubin: 0.3 mg/dL (calc) (ref 0.2–1.2)
Total Bilirubin: 0.4 mg/dL (ref 0.2–1.2)
Total Protein: 6.7 g/dL (ref 6.1–8.1)

## 2017-07-25 LAB — CBC WITH DIFFERENTIAL/PLATELET
Basophils Absolute: 28 cells/uL (ref 0–200)
Basophils Relative: 0.6 %
Eosinophils Absolute: 108 cells/uL (ref 15–500)
Eosinophils Relative: 2.3 %
HCT: 41.9 % (ref 35.0–45.0)
Hemoglobin: 14.4 g/dL (ref 11.7–15.5)
Lymphs Abs: 1452 cells/uL (ref 850–3900)
MCH: 33.3 pg — ABNORMAL HIGH (ref 27.0–33.0)
MCHC: 34.4 g/dL (ref 32.0–36.0)
MCV: 97 fL (ref 80.0–100.0)
MPV: 10.6 fL (ref 7.5–12.5)
Monocytes Relative: 11.3 %
Neutro Abs: 2580 cells/uL (ref 1500–7800)
Neutrophils Relative %: 54.9 %
Platelets: 224 10*3/uL (ref 140–400)
RBC: 4.32 10*6/uL (ref 3.80–5.10)
RDW: 11.7 % (ref 11.0–15.0)
Total Lymphocyte: 30.9 %
WBC mixed population: 531 cells/uL (ref 200–950)
WBC: 4.7 10*3/uL (ref 3.8–10.8)

## 2017-07-25 LAB — LIPID PANEL
Cholesterol: 215 mg/dL — ABNORMAL HIGH (ref <200)
HDL: 61 mg/dL (ref >50)
LDL Cholesterol (Calc): 126 mg/dL (calc) — ABNORMAL HIGH
Non-HDL Cholesterol (Calc): 154 mg/dL (calc) — ABNORMAL HIGH (ref <130)
Total CHOL/HDL Ratio: 3.5 (calc) (ref <5.0)
Triglycerides: 162 mg/dL — ABNORMAL HIGH (ref <150)

## 2017-07-25 LAB — VITAMIN D 25 HYDROXY (VIT D DEFICIENCY, FRACTURES): Vit D, 25-Hydroxy: 45 ng/mL (ref 30–100)

## 2017-07-25 LAB — BASIC METABOLIC PANEL WITH GFR
BUN/Creatinine Ratio: 13 (calc) (ref 6–22)
BUN: 13 mg/dL (ref 7–25)
CO2: 30 mmol/L (ref 20–32)
Calcium: 9.6 mg/dL (ref 8.6–10.4)
Chloride: 103 mmol/L (ref 98–110)
Creat: 1.01 mg/dL — ABNORMAL HIGH (ref 0.50–0.99)
GFR, Est African American: 67 mL/min/{1.73_m2} (ref >=60)
GFR, Est Non African American: 58 mL/min/{1.73_m2} — ABNORMAL LOW (ref >=60)
Glucose, Bld: 99 mg/dL (ref 65–99)
Potassium: 4.5 mmol/L (ref 3.5–5.3)
Sodium: 140 mmol/L (ref 135–146)

## 2017-07-25 LAB — MICROALBUMIN / CREATININE URINE RATIO
Creatinine, Urine: 116 mg/dL (ref 20–275)
Microalb Creat Ratio: 7 mcg/mg creat (ref <30)
Microalb, Ur: 0.8 mg/dL

## 2017-07-25 LAB — TSH: TSH: 0.43 mIU/L (ref 0.40–4.50)

## 2017-07-25 LAB — INSULIN, RANDOM: Insulin: 8 u[IU]/mL (ref 2.0–19.6)

## 2017-07-25 LAB — MAGNESIUM: Magnesium: 2.2 mg/dL (ref 1.5–2.5)

## 2017-07-25 MED ORDER — EZETIMIBE 10 MG PO TABS: ORAL_TABLET | ORAL | 1 refills | Status: DC

## 2017-09-05 HISTORY — PX: CATARACT EXTRACTION, BILATERAL: SHX1313

## 2017-10-23 DIAGNOSIS — N182 Chronic kidney disease, stage 2 (mild): Secondary | ICD-10-CM | POA: Insufficient documentation

## 2017-10-23 DIAGNOSIS — Z6825 Body mass index (BMI) 25.0-25.9, adult: Secondary | ICD-10-CM | POA: Insufficient documentation

## 2017-10-23 NOTE — Progress Notes (Signed)
WELCOME TO MEDICARE VISIT AND FOLLOW UP  Assessment:   Diagnoses and all orders for this visit:  Welcome to Medicare preventive visit  Essential hypertension At goal; continue current plan Monitor blood pressure at home; call if consistently over 130/80 Continue DASH diet.   Reminder to go to the ER if any CP, SOB, nausea, dizziness, severe HA, changes vision/speech, left arm numbness and tingling and jaw pain. -     EKG 12-Lead -     [place], RETROPERITNL ABD,  LTD  Abnormal glucose Recent A1Cs well controlled Discussed disease and risks Discussed diet/exercise, weight management  -     BASIC METABOLIC PANEL WITH GFR  Medication management -     CBC with Differential/Platelet -     BASIC METABOLIC PANEL WITH GFR -     Hepatic function panel  Mixed hyperlipidemia Remains somewhat above goal on zetia; severe intolerance of statin in the past Continue to encourage low cholesterol diet, exercise, weight management -     Lipid panel -     TSH  Vitamin D deficiency Continue supplementation for goal of 70-100 Check vitamin D level biannually - has not increased dose, defer today  CKD (chronic kidney disease), symptom management only, stage 2 (mild) -     BASIC METABOLIC PANEL WITH GFR  BMI 24.0-24.9, adult At goal Recommended diet heavy in fruits and veggies and low in animal meats, cheeses, and dairy products, appropriate calorie intake Discussed exercise recommendations Continue to monitor at each visit  Estrogen deficiency -     DG Bone Density; Future  Smoker Intermittently smoking for an estimated 45 pack years. -lung cancer screening with low dose CT discussed as recommended by guidelines based on age, number of pack year history.  Discussed risks of screening including but not limited to false positives on xray, further testing or consultation with specialist, and possible false negative CT as well. Patient would like to consider and defer at this time. Will revisit  periodically.   Need for mammogram Information provided for patient to schedule  Anxiety Well managed by current regimen; continue medications Stress management techniques discussed, increase water, good sleep hygiene discussed, increase exercise, and increase veggies.    Over 40 minutes of exam, counseling, chart review and critical decision making was performed Future Appointments  Date Time Provider [place]  01/24/2018  4:30 PM Unk Pinto, MD [facility] None  09/04/2018  3:00 PM Unk Pinto, MD [facility] None     Plan:   During the course of the visit the patient was educated and counseled about appropriate screening and preventive services including:    Pneumococcal vaccine   Prevnar 13  Influenza vaccine  Td vaccine  Screening electrocardiogram  Bone densitometry screening  Colorectal cancer screening  Diabetes screening  Glaucoma screening  Nutrition counseling   Advanced directives: requested   Subjective:  Brittany Hanna is a 67 y.o. female who presents for Medicare Annual Wellness Visit and 3 month follow up.   she currently continues to smoke ~1 pack a day; discussed risks associated with smoking, patient is not ready to quit.   BMI is Body mass index is 24.69 kg/m., she has been working on diet and exercise. Avoids meat, focuses on vegetables and fruit.  Wt Readings from Last 3 Encounters:  10/24/17 177 lb (80.3 kg)  07/24/17 176 lb 12.8 oz (80.2 kg)  06/01/16 176 lb (79.8 kg)   She does not check her BP at home, today their BP is BP:  136/74 She does workout. She denies chest pain, shortness of breath, dizziness.   She is on cholesterol medication (zetia 10 mg daily) and denies myalgias. Severe reaction to statin in the past. Her cholesterol is not at goal. The cholesterol last visit was:   Lab Results  Component Value Date   CHOL 215 (H) 07/24/2017   HDL 61 07/24/2017   LDLCALC 127 06/01/2016   TRIG 162 (H)  07/24/2017   CHOLHDL 3.5 07/24/2017    She has been working on diet and exercise for glucose management, and denies increased appetite, nausea, paresthesia of the feet, polydipsia, polyuria, visual disturbances, vomiting and weight loss. Last A1C in the office was:  Lab Results  Component Value Date   HGBA1C 4.9 07/24/2017    Last GFR: Lab Results  Component Value Date   GFRNONAA 58 (L) 07/24/2017   Patient is on Vitamin D supplement but remains well below goal of 70:   Lab Results  Component Value Date   VD25OH 45 07/24/2017      Medication Review: Current Outpatient Medications on File Prior to Visit  Medication Sig Dispense Refill  . aspirin EC 81 MG tablet Take 81 mg by mouth daily.    Marland Kitchen CALCIUM PO Take 1 tablet by mouth 3 (three) times a week. No specific days.    . Cholecalciferol (VITAMIN D PO) Take 5,000 Units by mouth daily. No specific days.    Marland Kitchen ezetimibe (ZETIA) 10 MG tablet Take 1 tablet daily for cholesterol 90 tablet 1  . hyoscyamine (LEVSIN SL) 0.125 MG SL tablet Place 1 tablet (0.125 mg total) every 4 (four) hours as needed under the tongue. 30 tablet 1  . omeprazole (PRILOSEC) 40 MG capsule Take 40 mg by mouth daily.     Marland Kitchen oxybutynin (DITROPAN) 5 MG tablet Take 1/2 to 1 tablet 2 to 3 x / day as needed for bladder 270 tablet 1  . sertraline (ZOLOFT) 100 MG tablet Take1 tablet  Daily for Mood (Patient taking differently: Take 1/2 tablet  Daily for Mood) 90 tablet 3   No current facility-administered medications on file prior to visit.     Allergies  Allergen Reactions  . Lipitor [Atorvastatin] Other (See Comments)    Fatigue.    Current Problems (verified) Patient Active Problem List   Diagnosis Date Noted  . Smoker 10/24/2017  . Anxiety 10/24/2017  . CKD (chronic kidney disease), symptom management only, stage 2 (mild) 10/23/2017  . BMI 24.0-24.9, adult 10/23/2017  . Medication management 03/19/2014  . Mixed hyperlipidemia 08/08/2013  . Essential  hypertension 08/08/2013  . Vitamin D deficiency 08/08/2013  . Abnormal glucose 08/08/2013    Screening Tests Immunization History  Administered Date(s) Administered  . PPD Test 03/19/2014, 03/24/2015, 06/01/2016  . Pneumococcal Conjugate-13 07/24/2017  . Pneumococcal Polysaccharide-23 06/01/2016  . Tdap 01/24/2013  . Zoster 11/03/2013    Preventative care: Last colonoscopy: 2011 Last mammogram: 2012, will give information to follow up  Last pap smear/pelvic exam: remote - typically goes to GYN, plans to follow up DEXA: will order today  Prior vaccinations: TD or Tdap: 2014  Influenza: 2018 Pneumococcal: 2017 Prevnar13: 2018 Shingles/Zostavax: 2015  Names of Other Physician/Practitioners you currently use: 1. [facility] here for primary care 2. ?, eye doctor, last visit 2018 3. [place], dentist, last visit 2019  Patient Care Team: Unk Pinto, MD as PCP - General (Internal Medicine)  SURGICAL HISTORY She  has a past surgical history that includes Tubal  ligation. FAMILY HISTORY Her family history includes COPD in her father; Heart failure in her mother; Hypertension in her father; Osteoarthritis in her father. SOCIAL HISTORY She  reports that she has been smoking cigarettes.  She has a 45.00 pack-year smoking history. she has never used smokeless tobacco. She reports that she drinks alcohol. She reports that she does not use drugs.   MEDICARE WELLNESS OBJECTIVES: Physical activity: Current Exercise Habits: Home exercise routine, Type of exercise: Other - see comments, Time (Minutes): 30, Frequency (Times/Week): 7, Weekly Exercise (Minutes/Week): 210, Intensity: Moderate, Exercise limited by: None identified Cardiac risk factors: Cardiac Risk Factors include: advanced age (>52men, >36 women);dyslipidemia;hypertension;smoking/ tobacco exposure Depression/mood screen:   Depression screen [facility] 2/9 10/24/2017  Decreased Interest 0   Down, Depressed, Hopeless 0  PHQ - 2 Score 0    ADLs:  In your present state of health, do you have any difficulty performing the following activities: 10/24/2017 07/24/2017  Hearing? N N  Comment Bilateral hearing aids -  Vision? N N  Difficulty concentrating or making decisions? N N  Walking or climbing stairs? N N  Dressing or bathing? N N  Doing errands, shopping? N N  Some recent data might be hidden     Cognitive Testing  Alert? Yes  Normal Appearance?Yes  Oriented to person? Yes  Place? Yes   Time? Yes  Recall of three objects?  Yes  Can perform simple calculations? Yes  Displays appropriate judgment?Yes  Can read the correct time from a watch face?Yes  EOL planning: Does Patient Have a Medical Advance Directive?: No Would patient like information on creating a medical advance directive?: No - Patient declined  Review of Systems  Constitutional: Negative for malaise/fatigue and weight loss.  HENT: Negative for hearing loss and tinnitus.   Eyes: Negative for blurred vision and double vision.  Respiratory: Negative for cough, shortness of breath and wheezing.   Cardiovascular: Negative for chest pain, palpitations, orthopnea, claudication and leg swelling.  Gastrointestinal: Negative for abdominal pain, blood in stool, constipation, diarrhea, heartburn, melena, nausea and vomiting.  Genitourinary: Negative.   Musculoskeletal: Negative for joint pain and myalgias.  Skin: Negative for rash.  Neurological: Negative for dizziness, tingling, sensory change, weakness and headaches.  Endo/Heme/Allergies: Negative for polydipsia.  Psychiatric/Behavioral: Negative.   All other systems reviewed and are negative.    Objective:     Today's Vitals   10/24/17 1644  BP: 136/74  Pulse: 70  Temp: 97.7 F (36.5 C)  SpO2: 97%  Weight: 177 lb (80.3 kg)  Height: 5\' 11"  (1.803 m)   Body mass index is 24.69 kg/m.  General appearance: alert, no distress, WD/WN, female HEENT:  normocephalic, sclerae anicteric, TMs pearly, nares patent, no discharge or erythema, pharynx normal Oral cavity: MMM, no lesions Neck: supple, no lymphadenopathy, no thyromegaly, no masses Heart: RRR, normal S1, S2, no murmurs Lungs: CTA bilaterally, no wheezes, rhonchi, or rales Abdomen: +bs, soft, non tender, non distended, no masses, no hepatomegaly, no splenomegaly Musculoskeletal: nontender, no swelling, no obvious deformity Extremities: no edema, no cyanosis, no clubbing Pulses: 2+ symmetric, upper and lower extremities, normal cap refill Neurological: alert, oriented x 3, CN2-12 intact, strength normal upper extremities and lower extremities, sensation normal throughout, DTRs 2+ throughout, no cerebellar signs, gait normal Psychiatric: normal affect, behavior normal, pleasant   EKG: No ST changes, WNL AAA screening US: negative, <3 cm  Medicare Attestation I have personally reviewed: The patient's medical and social history Their use of alcohol, tobacco or illicit  drugs Their current medications and supplements The patient's functional ability including ADLs,fall risks, home safety risks, cognitive, and hearing and visual impairment Diet and physical activities Evidence for depression or mood disorders  The patient's weight, height, BMI, and visual acuity have been recorded in the chart.  I have made referrals, counseling, and provided education to the patient based on review of the above and I have provided the patient with a written personalized care plan for preventive services.     Brittany Ribas, NP   10/24/2017

## 2017-10-24 ENCOUNTER — Encounter: Payer: Self-pay | Admitting: Adult Health

## 2017-10-24 ENCOUNTER — Ambulatory Visit (INDEPENDENT_AMBULATORY_CARE_PROVIDER_SITE_OTHER): Payer: Medicare Other | Admitting: Adult Health

## 2017-10-24 VITALS — BP 136/74 | HR 70 | Temp 97.7°F | Ht 71.0 in | Wt 177.0 lb

## 2017-10-24 DIAGNOSIS — E559 Vitamin D deficiency, unspecified: Secondary | ICD-10-CM | POA: Diagnosis not present

## 2017-10-24 DIAGNOSIS — Z79899 Other long term (current) drug therapy: Secondary | ICD-10-CM

## 2017-10-24 DIAGNOSIS — R6889 Other general symptoms and signs: Secondary | ICD-10-CM | POA: Diagnosis not present

## 2017-10-24 DIAGNOSIS — F419 Anxiety disorder, unspecified: Secondary | ICD-10-CM

## 2017-10-24 DIAGNOSIS — R7309 Other abnormal glucose: Secondary | ICD-10-CM

## 2017-10-24 DIAGNOSIS — Z0000 Encounter for general adult medical examination without abnormal findings: Secondary | ICD-10-CM

## 2017-10-24 DIAGNOSIS — E782 Mixed hyperlipidemia: Secondary | ICD-10-CM

## 2017-10-24 DIAGNOSIS — I10 Essential (primary) hypertension: Secondary | ICD-10-CM | POA: Diagnosis not present

## 2017-10-24 DIAGNOSIS — Z136 Encounter for screening for cardiovascular disorders: Secondary | ICD-10-CM | POA: Diagnosis not present

## 2017-10-24 DIAGNOSIS — Z6824 Body mass index (BMI) 24.0-24.9, adult: Secondary | ICD-10-CM | POA: Diagnosis not present

## 2017-10-24 DIAGNOSIS — Z0001 Encounter for general adult medical examination with abnormal findings: Secondary | ICD-10-CM

## 2017-10-24 DIAGNOSIS — N182 Chronic kidney disease, stage 2 (mild): Secondary | ICD-10-CM

## 2017-10-24 DIAGNOSIS — E2839 Other primary ovarian failure: Secondary | ICD-10-CM

## 2017-10-24 DIAGNOSIS — F17200 Nicotine dependence, unspecified, uncomplicated: Secondary | ICD-10-CM

## 2017-10-24 NOTE — Patient Instructions (Addendum)
Please call to schedule mammogram -   Consider low dose screening CT for lung cancer - information attached below - insurance should cover this  Recommend quitting or cutting back on smoking - if you would like medication to assist with this, please let us know at any time and we will be happy to prescribe this for you.    [facility]  7 a.m.-6:30 p.m., Monday 7 a.m.-5 p.m., Tuesday-Friday Schedule an appointment by calling [phone].  [facility] Schedule an appointment by calling [phone].    Lung Cancer Screening A lung cancer screening is a test that checks for lung cancer. Lung cancer screening is done to look for lung cancer in its very early stages, before it spreads and becomes harder to treat and before symptoms appear. Finding cancer early improves the chances of successful treatment. It may save your life. Should I be screened for lung cancer? You should be screened for lung cancer if all of these apply:  You currently smoke or you have quit smoking within the past 15 years.  You are 39-54 years old. Screening may be recommended up to age 43 depending on your overall health and other factors.  You are in good general health.  You have a 30-pack-year smoking history.  To find your pack-year history, multiply how many packages of cigarettes you smoked each day by the number of years you smoked. For example, if you smoked two packs of cigarettes each day for 15 years, your pack-year history is 30. If you are not sure what your pack-year smoking history is, ask your health care provider. Screening may also be recommended if you are at high risk for the disease. You may be at high risk if:  You have a family history of lung cancer.  You have been exposed to asbestos.  You have chronic obstructive pulmonary disease (COPD).  You have a history of previous lung cancer.  How often should I be screened for lung cancer? If you are  at risk for lung cancer, it is recommended that you are screened once a year. The recommended screening test is a low-dose CT scan. How can I lower my risk of lung cancer? To lower your risk of developing lung cancer:  If you smoke, stop smoking all tobacco products.  Avoid secondhand smoke.  Avoid exposure to radiation.  Avoid exposure to radon gas. Have your home checked for radon regularly.  Avoid things that cause cancer (carcinogens).  Avoid living or working in places with high air pollution.  Where to find more information: Ask your health care provider about the risks and benefits of screening. More information and resources are available from these organizations:  [place] (ACS): www.cancer.org  American Lung Association: www.lung.org  Contact a health care provider if:  You start to show symptoms of lung cancer, including: ? Coughing that will not go away. ? Wheezing. ? Chest pain. ? Coughing up blood. ? Shortness of breath. ? Weight loss that cannot be explained. ? Constant fatigue. Summary  Lung cancer screening may find lung cancer before symptoms appear. Finding cancer early improves the chances of successful treatment. It may save your life.  If you are at risk for lung cancer, it is recommended that you are screened once a year. The recommended screening test is a low-dose CT scan.  You can make lifestyle changes to lower your risk of lung cancer.  Ask your health care provider about the risks  and benefits of screening. This information is not intended to replace advice given to you by your health care provider. Make sure you discuss any questions you have with your health care provider. Document Released: 07/13/2016 Document Revised: 07/13/2016 Document Reviewed: 07/13/2016 Elsevier Interactive Patient Education  2018 [employer].    Smoking Tobacco Information Smoking tobacco will very likely harm your health. Tobacco contains a  poisonous (toxic), colorless chemical called nicotine. Nicotine affects the brain and makes tobacco addictive. This change in your brain can make it hard to stop smoking. Tobacco also has other toxic chemicals that can hurt your body and raise your risk of many cancers. How can smoking tobacco affect me? Smoking tobacco can increase your chances of having serious health conditions, such as:  Cancer. Smoking is most commonly associated with lung cancer, but can lead to cancer in other parts of the body.  Chronic obstructive pulmonary disease (COPD). This is a long-term lung condition that makes it hard to breathe. It also gets worse over time.  High blood pressure (hypertension), heart disease, stroke, or heart attack.  Lung infections, such as pneumonia.  Cataracts. This is when the lenses in the eyes become clouded.  Digestive problems. This may include peptic ulcers, heartburn, and gastroesophageal reflux disease (GERD).  Oral health problems, such as gum disease and tooth loss.  Loss of taste and smell.  Smoking can affect your appearance by causing:  Wrinkles.  Yellow or stained teeth, fingers, and fingernails.  Smoking tobacco can also affect your social life.  Many workplaces, [employer], hotels, and public places are tobacco-free. This means that you may experience challenges in finding places to smoke when away from home.  The cost of a smoking habit can be expensive. Expenses for someone who smokes come in two ways: ? You spend money on a regular basis to buy tobacco. ? Your health care costs in the long-term are higher if you smoke.  Tobacco smoke can also affect the health of those around you. Children of smokers have greater chances of: ? Sudden infant death syndrome (SIDS). ? Ear infections. ? Lung infections.  What lifestyle changes can be made?  Do not start smoking. Quit if you already do.  To quit smoking: ? Make a plan to quit smoking and commit yourself  to it. Look for programs to help you and ask your health care provider for recommendations and ideas. ? Talk with your health care provider about using nicotine replacement medicines to help you quit. Medicine replacement medicines include gum, lozenges, patches, sprays, or pills. ? Do not replace cigarette smoking with electronic cigarettes, which are commonly called e-cigarettes. The safety of e-cigarettes is not known, and some may contain harmful chemicals. ? Avoid places, people, or situations that tempt you to smoke. ? If you try to quit but return to smoking, don't give up hope. It is very common for people to try a number of times before they fully succeed. When you feel ready again, give it another try.  Quitting smoking might affect the way you eat as well as your weight. Be prepared to monitor your eating habits. Get support in planning and following a healthy diet.  Ask your health care provider about having regular tests (screenings) to check for cancer. This may include blood tests, imaging tests, and other tests.  Exercise regularly. Consider taking walks, joining a gym, or doing yoga or exercise classes.  Develop skills to manage your stress. These skills include meditation. What are the  benefits of quitting smoking? By quitting smoking, you may:  Lower your risk of getting cancer and other diseases caused by smoking.  Live longer.  Breathe better.  Lower your blood pressure and heart rate.  Stop your addiction to tobacco.  Stop creating secondhand smoke that hurts other people.  Improve your sense of taste and smell.  Look better over time, due to having fewer wrinkles and less staining.  What can happen if changes are not made? If you do not stop smoking, you may:  Get cancer and other diseases.  Develop COPD or other long-term (chronic) lung conditions.  Develop serious problems with your heart and blood vessels (cardiovascular system).  Need more tests to  screen for problems caused by smoking.  Have higher, long-term healthcare costs from medicines or treatments related to smoking.  Continue to have worsening changes in your lungs, mouth, and nose.  Where to find support: To get support to quit smoking, consider:  Asking your health care provider for more information and resources.  Taking classes to learn more about quitting smoking.  Looking for local organizations that offer resources about quitting smoking.  Joining a support group for people who want to quit smoking in your local community.  Where to find more information: You may find more information about quitting smoking from:  HelpGuide.org: www.helpguide.org/articles/addictions/how-to-quit-smoking.htm  https://hall.com/: smokefree.gov  American Lung Association: www.lung.org  Contact a health care provider if:  You have problems breathing.  Your lips, nose, or fingers turn blue.  You have chest pain.  You are coughing up blood.  You feel faint or you pass out.  You have other noticeable changes that cause you to worry. Summary  Smoking tobacco can negatively affect your health, the health of those around you, your finances, and your social life.  Do not start smoking. Quit if you already do. If you need help quitting, ask your health care provider.  Think about joining a support group for people who want to quit smoking in your local community. There are many effective programs that will help you to quit this behavior. This information is not intended to replace advice given to you by your health care provider. Make sure you discuss any questions you have with your health care provider. Document Released: 09/06/2016 Document Revised: 09/06/2016 Document Reviewed: 09/06/2016 Elsevier Interactive Patient Education  [employer].

## 2017-10-25 ENCOUNTER — Other Ambulatory Visit: Payer: Self-pay | Admitting: Adult Health

## 2017-10-25 DIAGNOSIS — N183 Chronic kidney disease, stage 3 unspecified: Secondary | ICD-10-CM

## 2017-10-25 LAB — LIPID PANEL
Cholesterol: 185 mg/dL (ref <200)
HDL: 54 mg/dL (ref >50)
LDL Cholesterol (Calc): 101 mg/dL (calc) — ABNORMAL HIGH
Non-HDL Cholesterol (Calc): 131 mg/dL (calc) — ABNORMAL HIGH (ref <130)
Total CHOL/HDL Ratio: 3.4 (calc) (ref <5.0)
Triglycerides: 179 mg/dL — ABNORMAL HIGH (ref <150)

## 2017-10-25 LAB — CBC WITH DIFFERENTIAL/PLATELET
Basophils Absolute: 40 cells/uL (ref 0–200)
Basophils Relative: 0.8 %
Eosinophils Absolute: 170 cells/uL (ref 15–500)
Eosinophils Relative: 3.4 %
HCT: 38.7 % (ref 35.0–45.0)
Hemoglobin: 13.8 g/dL (ref 11.7–15.5)
Lymphs Abs: 1685 cells/uL (ref 850–3900)
MCH: 34.2 pg — ABNORMAL HIGH (ref 27.0–33.0)
MCHC: 35.7 g/dL (ref 32.0–36.0)
MCV: 96 fL (ref 80.0–100.0)
MPV: 10.5 fL (ref 7.5–12.5)
Monocytes Relative: 11.2 %
Neutro Abs: 2545 cells/uL (ref 1500–7800)
Neutrophils Relative %: 50.9 %
Platelets: 193 10*3/uL (ref 140–400)
RBC: 4.03 10*6/uL (ref 3.80–5.10)
RDW: 11.9 % (ref 11.0–15.0)
Total Lymphocyte: 33.7 %
WBC mixed population: 560 cells/uL (ref 200–950)
WBC: 5 10*3/uL (ref 3.8–10.8)

## 2017-10-25 LAB — HEPATIC FUNCTION PANEL
AG Ratio: 1.7 (calc) (ref 1.0–2.5)
ALT: 12 U/L (ref 6–29)
AST: 16 U/L (ref 10–35)
Albumin: 4 g/dL (ref 3.6–5.1)
Alkaline phosphatase (APISO): 44 U/L (ref 33–130)
Bilirubin, Direct: 0.1 mg/dL (ref 0.0–0.2)
Globulin: 2.3 g/dL (calc) (ref 1.9–3.7)
Indirect Bilirubin: 0.2 mg/dL (calc) (ref 0.2–1.2)
Total Bilirubin: 0.3 mg/dL (ref 0.2–1.2)
Total Protein: 6.3 g/dL (ref 6.1–8.1)

## 2017-10-25 LAB — BASIC METABOLIC PANEL WITH GFR
BUN/Creatinine Ratio: 13 (calc) (ref 6–22)
BUN: 15 mg/dL (ref 7–25)
CO2: 28 mmol/L (ref 20–32)
Calcium: 9.2 mg/dL (ref 8.6–10.4)
Chloride: 105 mmol/L (ref 98–110)
Creat: 1.18 mg/dL — ABNORMAL HIGH (ref 0.50–0.99)
GFR, Est African American: 56 mL/min/{1.73_m2} — ABNORMAL LOW (ref >=60)
GFR, Est Non African American: 48 mL/min/{1.73_m2} — ABNORMAL LOW (ref >=60)
Glucose, Bld: 92 mg/dL (ref 65–99)
Potassium: 4.3 mmol/L (ref 3.5–5.3)
Sodium: 140 mmol/L (ref 135–146)

## 2017-10-25 LAB — TSH: TSH: 0.51 mIU/L (ref 0.40–4.50)

## 2017-10-30 ENCOUNTER — Telehealth: Payer: Self-pay | Admitting: Gastroenterology

## 2017-10-30 ENCOUNTER — Encounter (HOSPITAL_BASED_OUTPATIENT_CLINIC_OR_DEPARTMENT_OTHER): Payer: Self-pay | Admitting: *Deleted

## 2017-10-30 ENCOUNTER — Emergency Department (HOSPITAL_BASED_OUTPATIENT_CLINIC_OR_DEPARTMENT_OTHER): Payer: Medicare Other

## 2017-10-30 ENCOUNTER — Encounter: Payer: Self-pay | Admitting: Internal Medicine

## 2017-10-30 ENCOUNTER — Other Ambulatory Visit: Payer: Self-pay

## 2017-10-30 ENCOUNTER — Emergency Department (HOSPITAL_BASED_OUTPATIENT_CLINIC_OR_DEPARTMENT_OTHER)
Admission: EM | Admit: 2017-10-30 | Discharge: 2017-10-30 | Disposition: A | Discharge status: Home / Self Care | Payer: Medicare Other | Attending: Emergency Medicine | Admitting: Emergency Medicine

## 2017-10-30 DIAGNOSIS — Z79899 Other long term (current) drug therapy: Secondary | ICD-10-CM | POA: Diagnosis not present

## 2017-10-30 DIAGNOSIS — R1030 Lower abdominal pain, unspecified: Secondary | ICD-10-CM | POA: Diagnosis present

## 2017-10-30 DIAGNOSIS — R112 Nausea with vomiting, unspecified: Secondary | ICD-10-CM | POA: Diagnosis not present

## 2017-10-30 DIAGNOSIS — I700 Atherosclerosis of aorta: Secondary | ICD-10-CM | POA: Insufficient documentation

## 2017-10-30 DIAGNOSIS — K529 Noninfective gastroenteritis and colitis, unspecified: Secondary | ICD-10-CM | POA: Diagnosis not present

## 2017-10-30 DIAGNOSIS — Z7982 Long term (current) use of aspirin: Secondary | ICD-10-CM | POA: Diagnosis not present

## 2017-10-30 DIAGNOSIS — F17210 Nicotine dependence, cigarettes, uncomplicated: Secondary | ICD-10-CM | POA: Insufficient documentation

## 2017-10-30 DIAGNOSIS — K449 Diaphragmatic hernia without obstruction or gangrene: Secondary | ICD-10-CM | POA: Diagnosis not present

## 2017-10-30 DIAGNOSIS — K921 Melena: Secondary | ICD-10-CM | POA: Diagnosis not present

## 2017-10-30 DIAGNOSIS — N182 Chronic kidney disease, stage 2 (mild): Secondary | ICD-10-CM | POA: Diagnosis not present

## 2017-10-30 DIAGNOSIS — R1031 Right lower quadrant pain: Secondary | ICD-10-CM | POA: Diagnosis not present

## 2017-10-30 DIAGNOSIS — K7689 Other specified diseases of liver: Secondary | ICD-10-CM | POA: Diagnosis not present

## 2017-10-30 DIAGNOSIS — I129 Hypertensive chronic kidney disease with stage 1 through stage 4 chronic kidney disease, or unspecified chronic kidney disease: Secondary | ICD-10-CM | POA: Insufficient documentation

## 2017-10-30 LAB — CBC WITH DIFFERENTIAL/PLATELET
Basophils Absolute: 0 10*3/uL (ref 0.0–0.1)
Basophils Relative: 0 %
Eosinophils Absolute: 0 10*3/uL (ref 0.0–0.7)
Eosinophils Relative: 0 %
HCT: 41.7 % (ref 36.0–46.0)
Hemoglobin: 14.8 g/dL (ref 12.0–15.0)
Lymphocytes Relative: 12 %
Lymphs Abs: 1.8 10*3/uL (ref 0.7–4.0)
MCH: 34.3 pg — ABNORMAL HIGH (ref 26.0–34.0)
MCHC: 35.5 g/dL (ref 30.0–36.0)
MCV: 96.5 fL (ref 78.0–100.0)
Monocytes Absolute: 1.3 10*3/uL — ABNORMAL HIGH (ref 0.1–1.0)
Monocytes Relative: 9 %
Neutro Abs: 11.5 10*3/uL — ABNORMAL HIGH (ref 1.7–7.7)
Neutrophils Relative %: 79 %
Platelets: 204 10*3/uL (ref 150–400)
RBC: 4.32 MIL/uL (ref 3.87–5.11)
RDW: 12.1 % (ref 11.5–15.5)
WBC: 14.6 10*3/uL — ABNORMAL HIGH (ref 4.0–10.5)

## 2017-10-30 LAB — COMPREHENSIVE METABOLIC PANEL
ALT: 14 U/L (ref 14–54)
AST: 21 U/L (ref 15–41)
Albumin: 4 g/dL (ref 3.5–5.0)
Alkaline Phosphatase: 52 U/L (ref 38–126)
Anion gap: 9 (ref 5–15)
BUN: 11 mg/dL (ref 6–20)
CO2: 26 mmol/L (ref 22–32)
Calcium: 9 mg/dL (ref 8.9–10.3)
Chloride: 101 mmol/L (ref 101–111)
Creatinine, Ser: 0.96 mg/dL (ref 0.44–1.00)
GFR calc Af Amer: >60 mL/min (ref >60)
GFR calc non Af Amer: >60 mL/min (ref >60)
Glucose, Bld: 117 mg/dL — ABNORMAL HIGH (ref 65–99)
Potassium: 3.7 mmol/L (ref 3.5–5.1)
Sodium: 136 mmol/L (ref 135–145)
Total Bilirubin: 1 mg/dL (ref 0.3–1.2)
Total Protein: 7.2 g/dL (ref 6.5–8.1)

## 2017-10-30 LAB — OCCULT BLOOD X 1 CARD TO LAB, STOOL: Fecal Occult Bld: POSITIVE — AB

## 2017-10-30 MED ORDER — IOPAMIDOL (ISOVUE-300) INJECTION 61%
100.0000 mL | Freq: Once | INTRAVENOUS | Status: AC | PRN
  Administered 2017-10-30: 100 mL via INTRAVENOUS

## 2017-10-30 MED ORDER — AMOXICILLIN-POT CLAVULANATE 875-125 MG PO TABS: 1.0000 | ORAL_TABLET | Freq: Two times a day (BID) | ORAL | 0 refills | Status: AC

## 2017-10-30 NOTE — Telephone Encounter (Signed)
Patient reports terrible lower abdominal pain and rectal bleeding that started last night.  The took Levsin with no relief.  She reports not a large amount of bleeding, but every hour she is passing blood.  Patient is advised that she should be evaluated in the ED or at the very least a Urgent care.  She states if the pain returns "like it was last night I will go to the ER"

## 2017-10-30 NOTE — ED Notes (Signed)
Vital signs stable. 

## 2017-10-30 NOTE — Discharge Instructions (Signed)
Please read instructions below. Begin taking the antibiotic, Augmentin, as prescribed until it is gone. It is important to stay hydrated. Follow up with your GI specialist or PCP regarding your visit today. Return to the ER for severely worsening abdominal pain, fever, if you are unable to keep down liquids, or new or concerning symptoms.

## 2017-10-30 NOTE — ED Notes (Signed)
Patient transported to CT 

## 2017-10-30 NOTE — ED Provider Notes (Signed)
[facility]PART[facility] Provider Note   CSN: [id number] Arrival date & time: 10/30/17  1647     History   Chief Complaint Chief Complaint  Patient presents with  . Rectal Bleeding    HPI Brittany Hanna is a 67 y.o. female with past medical history of hypertension, IBS, spastic colon, presenting to the ED with bloody diarrhea began last night.  Patient states bilateral lower abdominal pain which is typical for her symptoms caused by the colon.  She states she took hyoscyamine without relief of her pain.  She states she then began having watery stools throughout the night.  She states this morning she noticed a moderate amount of bright red blood tissue in the toilet.  She states she is unsure if she was having bloody stools throughout the night because she not have the bathroom light on.  She states the diarrhea has resolved, and the belly pain feels more sore in nature.  She reports mild nausea and an episode of emesis as well.  She states she attempted to be seen by her GI specialist by our GI, however they were unable to get her in today with her next available appointment at the end of March.  She was recommended to report to the ED for evaluation.  Denies vaginal bleeding or discharge, urinary symptoms, fever or chills, or other complaints.  Denies history of hemorrhoids. No recent antibiotics, no recent hospital admissions.  The history is provided by the patient.    Past Medical History:  Diagnosis Date  . B12 deficiency   . High cholesterol   . Hypertension   . IBS (irritable bowel syndrome)   . Vitamin D deficiency     Patient Active Problem List   Diagnosis Date Noted  . Smoker 10/24/2017  . Anxiety 10/24/2017  . CKD (chronic kidney disease), symptom management only, stage 2 (mild) 10/23/2017  . BMI 24.0-24.9, adult 10/23/2017  . Medication management 03/19/2014  . Mixed hyperlipidemia 08/08/2013  . Essential hypertension 08/08/2013  . Vitamin D  deficiency 08/08/2013  . Abnormal glucose 08/08/2013    Past Surgical History:  Procedure Laterality Date  . TUBAL LIGATION      OB History    No data available       Home Medications    Prior to Admission medications   Medication Sig Start Date End Date Taking? Authorizing Provider  amoxicillin-clavulanate (AUGMENTIN) 875-125 MG tablet Take 1 tablet by mouth every 12 (twelve) hours for 10 days. 10/30/17 11/09/17  Robinson, [place] N, PA-C  aspirin EC 81 MG tablet Take 81 mg by mouth daily.    [provider]  CALCIUM PO Take 1 tablet by mouth 3 (three) times a week. No specific days.    [provider]  Cholecalciferol (VITAMIN D PO) Take 5,000 Units by mouth daily. No specific days.    [provider]  ezetimibe (ZETIA) 10 MG tablet Take 1 tablet daily for cholesterol 07/25/17 02/22/18  Unk Pinto, MD  hyoscyamine (LEVSIN SL) 0.125 MG SL tablet Place 1 tablet (0.125 mg total) every 4 (four) hours as needed under the tongue. 07/24/17   Unk Pinto, MD  omeprazole (PRILOSEC) 40 MG capsule Take 40 mg by mouth daily.     [provider]  oxybutynin (DITROPAN) 5 MG tablet Take 1/2 to 1 tablet 2 to 3 x / day as needed for bladder 04/27/17 11/25/17  Unk Pinto, MD  sertraline (ZOLOFT) 100 MG tablet Take1 tablet  Daily for  Mood Patient taking differently: Take 1/2 tablet  Daily for Mood 04/27/17 04/27/18  Unk Pinto, MD    Family History Family History  Problem Relation Age of Onset  . Heart failure Mother   . Osteoarthritis Father   . Hypertension Father   . COPD Father     Social History Social History   Tobacco Use  . Smoking status: Current Some Day Smoker    Packs/day: 1.00    Years: 45.00    Pack years: 45.00    Types: Cigarettes  . Smokeless tobacco: Never Used  Substance Use Topics  . Alcohol use: Yes    Comment: occasionally  . Drug use: No     Allergies   Lipitor [atorvastatin]   Review of  Systems Review of Systems  Constitutional: Negative for chills and fever.  Gastrointestinal: Positive for abdominal pain, blood in stool, diarrhea, nausea (resolved) and vomiting (resolved).  Genitourinary: Negative for dysuria, frequency, vaginal bleeding and vaginal discharge.  All other systems reviewed and are negative.    Physical Exam Updated Vital Signs BP 136/80 (BP Location: Left Arm)   Pulse 72   Temp 98.7 F (37.1 C) (Oral)   Resp 18   Ht 5\' 11"  (1.803 m)   Wt 80.3 kg (177 lb)   SpO2 97%   BMI 24.69 kg/m   Physical Exam  Constitutional: She appears well-developed and well-nourished. No distress.  HENT:  Head: Normocephalic and atraumatic.  Eyes: Conjunctivae are normal.  Cardiovascular: Normal rate, regular rhythm, normal heart sounds and intact distal pulses.  Pulmonary/Chest: Effort normal and breath sounds normal. No respiratory distress.  Abdominal: Soft. Bowel sounds are normal. She exhibits no distension. There is no tenderness. There is no rebound and no guarding.  Genitourinary:  Genitourinary Comments: Exam performed with female chaperone present.  Gross blood noted at anus.  No hemorrhoids visualized or palpated on exam, without significant tenderness.  Neurological: She is alert.  Skin: Skin is warm.  Psychiatric: She has a normal mood and affect. Her behavior is normal.  Nursing note and vitals reviewed.    ED Treatments / Results  Labs (all labs ordered are listed, but only abnormal results are displayed) Labs Reviewed  CBC WITH DIFFERENTIAL/PLATELET - Abnormal; Notable for the following components:      Result Value   WBC 14.6 (*)    MCH 34.3 (*)    Neutro Abs 11.5 (*)    Monocytes Absolute 1.3 (*)    All other components within normal limits  COMPREHENSIVE METABOLIC PANEL - Abnormal; Notable for the following components:   Glucose, Bld 117 (*)    All other components within normal limits  OCCULT BLOOD X 1 CARD TO LAB, STOOL - Abnormal;  Notable for the following components:   Fecal Occult Bld POSITIVE (*)    All other components within normal limits    EKG  EKG Interpretation None       Radiology Ct Abdomen Pelvis W Contrast  Result Date: 10/30/2017 CLINICAL DATA:  GI bleed, diarrhea EXAM: CT ABDOMEN AND PELVIS WITH CONTRAST TECHNIQUE: Multidetector CT imaging of the abdomen and pelvis was performed using the standard protocol following bolus administration of intravenous contrast. CONTRAST:  12mL ISOVUE-300 IOPAMIDOL (ISOVUE-300) INJECTION 61% COMPARISON:  None. FINDINGS: Lower chest: Lung bases are clear. No effusions. Heart is normal size. Small hiatal hernia. Hepatobiliary: Scattered hypodensities throughout the liver most compatible with small cysts. No biliary ductal dilatation. Gallbladder unremarkable. Pancreas: No focal abnormality or ductal dilatation. Spleen: No  focal abnormality.  Normal size. Adrenals/Urinary Tract: No adrenal abnormality. No focal renal abnormality. No stones or hydronephrosis. Urinary bladder is unremarkable. Stomach/Bowel: Abnormal circumferential wall thickening involving the descending colon to the proximal sigmoid colon compatible with infectious or inflammatory colitis. Surrounding inflammatory stranding and a small amount of fluid in the left paracolic gutter. No free air. Vascular/Lymphatic: Aortic atherosclerosis. No enlarged abdominal or pelvic lymph nodes. Reproductive: Uterus and adnexa unremarkable.  No mass. Other: No free fluid or free air. Musculoskeletal: No acute bony abnormality. IMPRESSION: Abnormal wall thickening involving the descending colon and proximal sigmoid colon with surrounding inflammatory change. Findings compatible with infectious or inflammatory colitis. Aortic atherosclerosis. Small cysts scattered throughout the liver. Small hiatal hernia. Electronically Signed   By: Rolm Baptise M.D.   On: 10/30/2017 21:20    Procedures Procedures (including critical care  time)  Medications Ordered in ED Medications  iopamidol (ISOVUE-300) 61 % injection 100 mL (100 mLs Intravenous Contrast Given 10/30/17 2107)     Initial Impression / Assessment and Plan / ED Course  I have reviewed the triage vital signs and the nursing notes.  Pertinent labs & imaging results that were available during my care of the patient were reviewed by me and considered in my medical decision making (see chart for details).  Clinical Course as of Oct 31 2131  Mon Oct 30, 2017  1948 Rectal exam with gross blood, no hemorrhoids or significant tenderness noted  [JR]    Clinical Course User Index [JR] Robinson, [place] N, PA-C    Patient presenting to the ED with abdominal pain and bloody diarrhea that began last night.  Abdominal pain has improved throughout the day, however she still notices blood on the tissue after using the bathroom.  History of IBS and spastic colon, however the abdominal surgeries.  Abdomen is soft and nontender on exam.  Rectal exam with gross blood and no hemorrhoids visualized.  Mild leukocytosis.  Hemoglobin stable.  Positive Hemoccult.  CMP unremarkable.  Patient discussed with Dr. Regenia Skeeter.  CT abdomen pelvis ordered to rule out acute colitis or other acute intra-abdominal pathology.  CT revealing acute colitis, presumably an inflammatory versus infectious etiology.  Patient without risk factors for C. difficile, and no evidence of vascular cause of GI bleeding.  Patient evaluated by Dr. Regenia Skeeter.  Plan to discharge with symptomatic treatment and Augmentin.  Recommended to follow-up with her GI specialist or PCP regarding visit today.  Strict return precautions discussed.  Safe for discharge.  Discussed results, findings, treatment and follow up. Patient advised of return precautions. Patient verbalized understanding and agreed with plan.  Final Clinical Impressions(s) / ED Diagnoses   Final diagnoses:  Acute colitis    ED Discharge Orders         Ordered    amoxicillin-clavulanate (AUGMENTIN) 875-125 MG tablet  Every 12 hours     10/30/17 2133       Robinson, [place] N, PA-C 10/30/17 2133    Sherwood Gambler, MD 10/31/17 [phone]

## 2017-10-30 NOTE — ED Triage Notes (Signed)
Diarrhea followed by bright red rectal bleeding since yesterday. Abdominal pain.

## 2017-11-09 ENCOUNTER — Other Ambulatory Visit: Payer: Medicare Other

## 2017-11-09 DIAGNOSIS — N183 Chronic kidney disease, stage 3 unspecified: Secondary | ICD-10-CM

## 2017-11-10 LAB — BASIC METABOLIC PANEL WITH GFR
BUN: 14 mg/dL (ref 7–25)
CO2: 27 mmol/L (ref 20–32)
Calcium: 9.2 mg/dL (ref 8.6–10.4)
Chloride: 106 mmol/L (ref 98–110)
Creat: 0.91 mg/dL (ref 0.50–0.99)
GFR, Est African American: 76 mL/min/{1.73_m2} (ref >=60)
GFR, Est Non African American: 66 mL/min/{1.73_m2} (ref >=60)
Glucose, Bld: 92 mg/dL (ref 65–99)
Potassium: 4.2 mmol/L (ref 3.5–5.3)
Sodium: 141 mmol/L (ref 135–146)

## 2017-11-30 ENCOUNTER — Ambulatory Visit: Admitting: Gastroenterology

## 2017-12-03 DIAGNOSIS — J101 Influenza due to other identified influenza virus with other respiratory manifestations: Secondary | ICD-10-CM | POA: Diagnosis not present

## 2018-01-24 ENCOUNTER — Ambulatory Visit (INDEPENDENT_AMBULATORY_CARE_PROVIDER_SITE_OTHER): Payer: Medicare Other | Admitting: Internal Medicine

## 2018-01-24 VITALS — BP 136/80 | HR 68 | Temp 97.5°F | Resp 16 | Ht 71.0 in | Wt 171.2 lb

## 2018-01-24 DIAGNOSIS — E782 Mixed hyperlipidemia: Secondary | ICD-10-CM | POA: Diagnosis not present

## 2018-01-24 DIAGNOSIS — Z79899 Other long term (current) drug therapy: Secondary | ICD-10-CM | POA: Diagnosis not present

## 2018-01-24 DIAGNOSIS — E559 Vitamin D deficiency, unspecified: Secondary | ICD-10-CM | POA: Diagnosis not present

## 2018-01-24 DIAGNOSIS — I10 Essential (primary) hypertension: Secondary | ICD-10-CM

## 2018-01-24 DIAGNOSIS — R7309 Other abnormal glucose: Secondary | ICD-10-CM

## 2018-01-24 DIAGNOSIS — K219 Gastro-esophageal reflux disease without esophagitis: Secondary | ICD-10-CM

## 2018-01-24 NOTE — Patient Instructions (Addendum)
Recommend Adult Low Dose Aspirin or  coated  Aspirin 81 mg daily  To reduce risk of Colon Cancer 20 %,  Skin Cancer 26 % ,  Melanoma 46%  and  Pancreatic cancer 60% +++++++++++++++++++++++++ Vitamin D goal  is between 70-100.  Please make sure that you are taking your Vitamin D as directed.  It is very important as a natural anti-inflammatory  helping hair, skin, and nails, as well as reducing stroke and heart attack risk.  It helps your bones and helps with mood. It also decreases numerous cancer risks so please take it as directed.  Low Vit D is associated with a 200-300% higher risk for CANCER  and 200-300% higher risk for HEART   ATTACK  &  STROKE.   ...................................... It is also associated with higher death rate at younger ages,  autoimmune diseases like Rheumatoid arthritis, Lupus, Multiple Sclerosis.    Also many other serious conditions, like depression, Alzheimer's Dementia, infertility, muscle aches, fatigue, fibromyalgia - just to name a few. ++++++++++++++++++++ Recommend the book "The END of DIETING" by Dr Joel Fuhrman  & the book "The END of DIABETES " by Dr Joel Fuhrman At [employer] - get book & Audio CD's    Being diabetic has a  300% increased risk for heart attack, stroke, cancer, and alzheimer- type vascular dementia. It is very important that you work harder with diet by avoiding all foods that are white. Avoid white rice (brown & wild rice is OK), white potatoes (sweetpotatoes in moderation is OK), White bread or wheat bread or anything made out of white flour like bagels, donuts, rolls, buns, biscuits, cakes, pastries, cookies, pizza crust, and pasta (made from white flour & egg whites) - vegetarian pasta or spinach or wheat pasta is OK. Multigrain breads like Arnold's or Pepper[employer], or multigrain sandwich thins or flatbreads.  Diet, exercise and weight loss can reverse and cure diabetes in the early stages.  Diet, exercise and weight loss is  very important in the control and prevention of complications of diabetes which affects every system in your body, ie. Brain - dementia/stroke, eyes - glaucoma/blindness, heart - heart attack/heart failure, kidneys - dialysis, stomach - gastric paralysis, intestines - malabsorption, nerves - severe painful neuritis, circulation - gangrene & loss of a leg(s), and finally cancer and Alzheimers.    I recommend avoid fried & greasy foods,  sweets/candy, white rice (brown or wild rice or Quinoa is OK), white potatoes (sweet potatoes are OK) - anything made from white flour - bagels, doughnuts, rolls, buns, biscuits,white and wheat breads, pizza crust and traditional pasta made of white flour & egg white(vegetarian pasta or spinach or wheat pasta is OK).  Multi-grain bread is OK - like multi-grain flat bread or sandwich thins. Avoid alcohol in excess. Exercise is also important.    Eat all the vegetables you want - avoid meat, especially red meat and dairy - especially cheese.  Cheese is the most concentrated form of trans-fats which is the worst thing to clog up our arteries. Veggie cheese is OK which can be found in the fresh produce section at [employer] or [employer] or [employer]  +++++++++++++++++++++ DASH Eating Plan  DASH stands for "Dietary Approaches to Stop Hypertension."   The DASH eating plan is a healthy eating plan that has been shown to reduce high blood pressure (hypertension). Additional health benefits may include reducing the risk of type 2 diabetes mellitus, heart disease, and stroke. The DASH eating plan may also   help with weight loss. WHAT DO I NEED TO KNOW ABOUT THE DASH EATING PLAN? For the DASH eating plan, you will follow these general guidelines:  Choose foods with a percent daily value for sodium of less than 5% (as listed on the food label).  Use salt-free seasonings or herbs instead of table salt or sea salt.  Check with your health care provider or pharmacist before  using salt substitutes.  Eat lower-sodium products, often labeled as "lower sodium" or "no salt added."  Eat fresh foods.  Eat more vegetables, fruits, and low-fat dairy products.  Choose whole grains. Look for the word "whole" as the first word in the ingredient list.  Choose fish   Limit sweets, desserts, sugars, and sugary drinks.  Choose heart-healthy fats.  Eat veggie cheese   Eat more home-cooked food and less restaurant, buffet, and fast food.  Limit fried foods.  Cook foods using methods other than frying.  Limit canned vegetables. If you do use them, rinse them well to decrease the sodium.  When eating at a restaurant, ask that your food be prepared with less salt, or no salt if possible.                      WHAT FOODS CAN I EAT? Read Dr Joel Fuhrman's books on The End of Dieting & The End of Diabetes  Grains Whole grain or whole wheat bread. Brown rice. Whole grain or whole wheat pasta. Quinoa, bulgur, and whole grain cereals. Low-sodium cereals. Corn or whole wheat flour tortillas. Whole grain cornbread. Whole grain crackers. Low-sodium crackers.  Vegetables Fresh or frozen vegetables (raw, steamed, roasted, or grilled). Low-sodium or reduced-sodium tomato and vegetable juices. Low-sodium or reduced-sodium tomato sauce and paste. Low-sodium or reduced-sodium canned vegetables.   Fruits All fresh, canned (in natural juice), or frozen fruits.  Protein Products  All fish and seafood.  Dried beans, peas, or lentils. Unsalted nuts and seeds. Unsalted canned beans.  Dairy Low-fat dairy products, such as skim or 1% milk, 2% or reduced-fat cheeses, low-fat ricotta or cottage cheese, or plain low-fat yogurt. Low-sodium or reduced-sodium cheeses.  Fats and Oils Tub margarines without trans fats. Light or reduced-fat mayonnaise and salad dressings (reduced sodium). Avocado. Safflower, olive, or canola oils. Natural peanut or almond butter.  Other Unsalted popcorn  and pretzels. The items listed above may not be a complete list of recommended foods or beverages. Contact your dietitian for more options.  +++++++++++++++  WHAT FOODS ARE NOT RECOMMENDED? Grains/ White flour or wheat flour White bread. White pasta. White rice. Refined cornbread. Bagels and croissants. Crackers that contain trans fat.  Vegetables  Creamed or fried vegetables. Vegetables in a . Regular canned vegetables. Regular canned tomato sauce and paste. Regular tomato and vegetable juices.  Fruits Dried fruits. Canned fruit in light or heavy syrup. Fruit juice.  Meat and Other Protein Products Meat in general - RED meat & White meat.  Fatty cuts of meat. Ribs, chicken wings, all processed meats as bacon, sausage, bologna, salami, fatback, hot dogs, bratwurst and packaged luncheon meats.  Dairy Whole or 2% milk, cream, half-and-half, and cream cheese. Whole-fat or sweetened yogurt. Full-fat cheeses or blue cheese. Non-dairy creamers and whipped toppings. Processed cheese, cheese spreads, or cheese curds.  Condiments Onion and garlic salt, seasoned salt, table salt, and sea salt. Canned and packaged gravies. Worcestershire sauce. Tartar sauce. Barbecue sauce. Teriyaki sauce. Soy sauce, including reduced sodium. Steak sauce. Fish sauce. Oyster sauce. Cocktail   sauce. Horseradish. Ketchup and mustard. Meat flavorings and tenderizers. Bouillon cubes. Hot sauce. Tabasco sauce. Marinades. Taco seasonings. Relishes.  Fats and Oils Butter, stick margarine, lard, shortening and bacon fat. Coconut, palm kernel, or palm oils. Regular salad dressings.  Pickles and olives. Salted popcorn and pretzels.  The items listed above may not be a complete list of foods and beverages to avoid.  +++++++++++++++++++++++++++++++++++  Gastroesophageal Reflux Disease, Adult Normally, food travels down the esophagus and stays in the stomach to be digested. However, when a person has gastroesophageal reflux  disease (GERD), food and stomach acid move back up into the esophagus. When this happens, the esophagus becomes sore and inflamed. Over time, GERD can create small holes (ulcers) in the lining of the esophagus. What are the causes? This condition is caused by a problem with the muscle between the esophagus and the stomach (lower esophageal sphincter, or LES). Normally, the LES muscle closes after food passes through the esophagus to the stomach. When the LES is weakened or abnormal, it does not close properly, and that allows food and stomach acid to go back up into the esophagus. The LES can be weakened by certain dietary substances, medicines, and medical conditions, including:  Tobacco use.  Pregnancy.  Having a hiatal hernia.  Heavy alcohol use.  Certain foods and beverages, such as coffee, chocolate, onions, and peppermint.  What increases the risk? This condition is more likely to develop in:  People who have an increased body weight.  People who have connective tissue disorders.  People who use NSAID medicines.  What are the signs or symptoms? Symptoms of this condition include:  Heartburn.  Difficult or painful swallowing.  The feeling of having a lump in the throat.  Abitter taste in the mouth.  Bad breath.  Having a large amount of saliva.  Having an upset or bloated stomach.  Belching.  Chest pain.  Shortness of breath or wheezing.  Ongoing (chronic) cough or a night-time cough.  Wearing away of tooth enamel.  Weight loss.  Different conditions can cause chest pain. Make sure to see your health care provider if you experience chest pain. How is this diagnosed? Your health care provider will take a medical history and perform a physical exam. To determine if you have mild or severe GERD, your health care provider may also monitor how you respond to treatment. You may also have other tests, including:  An endoscopy toexamine your stomach and  esophagus with a small camera.  A test thatmeasures the acidity level in your esophagus.  A test thatmeasures how much pressure is on your esophagus.  A barium swallow or modified barium swallow to show the shape, size, and functioning of your esophagus.  How is this treated? The goal of treatment is to help relieve your symptoms and to prevent complications. Treatment for this condition may vary depending on how severe your symptoms are. Your health care provider may recommend:  Changes to your diet.  Medicine.  Surgery.  Follow these instructions at home: Diet  Follow a diet as recommended by your health care provider. This may involve avoiding foods and drinks such as: ? Coffee and tea (with or without caffeine). ? Drinks that containalcohol. ? Energy drinks and sports drinks. ? Carbonated drinks or sodas. ? Chocolate and cocoa. ? Peppermint and mint flavorings. ? Garlic and onions. ? Horseradish. ? Spicy and acidic foods, including peppers, chili powder, curry powder, vinegar, hot sauces, and barbecue sauce. ? Citrus fruit juices and  citrus fruits, such as oranges, lemons, and limes. ? Tomato-based foods, such as red sauce, chili, salsa, and pizza with red sauce. ? Fried and fatty foods, such as donuts, french fries, potato chips, and high-fat dressings. ? High-fat meats, such as hot dogs and fatty cuts of red and white meats, such as rib eye steak, sausage, ham, and bacon. ? High-fat dairy items, such as whole milk, butter, and cream cheese.  Eat small, frequent meals instead of large meals.  Avoid drinking large amounts of liquid with your meals.  Avoid eating meals during the 2-3 hours before bedtime.  Avoid lying down right after you eat.  Do not exercise right after you eat. General instructions  Pay attention to any changes in your symptoms.  Take over-the-counter and prescription medicines only as told by your health care provider. Do not take aspirin,  ibuprofen, or other NSAIDs unless your health care provider told you to do so.  Do not use any tobacco products, including cigarettes, chewing tobacco, and e-cigarettes. If you need help quitting, ask your health care provider.  Wear loose-fitting clothing. Do not wear anything tight around your waist that causes pressure on your abdomen.  Raise (elevate) the head of your bed 6 inches (15cm).  Try to reduce your stress, such as with yoga or meditation. If you need help reducing stress, ask your health care provider.  If you are overweight, reduce your weight to an amount that is healthy for you. Ask your health care provider for guidance about a safe weight loss goal.  Keep all follow-up visits as told by your health care provider. This is important. Contact a health care provider if:  You have new symptoms.  You have unexplained weight loss.  You have difficulty swallowing, or it hurts to swallow.  You have wheezing or a persistent cough.  Your symptoms do not improve with treatment.  You have a hoarse voice. Get help right away if:  You have pain in your arms, neck, jaw, teeth, or back.  You feel sweaty, dizzy, or light-headed.  You have chest pain or shortness of breath.  You vomit and your vomit looks like blood or coffee grounds.  You faint.  Your stool is bloody or black.  You cannot swallow, drink, or eat. +++++++++++++++++++++++++++++++++  Food Choices for Gastroesophageal Reflux Disease, Adult When you have gastroesophageal reflux disease (GERD), the foods you eat and your eating habits are very important. Choosing the right foods can help ease the discomfort of GERD. Consider working with a diet and nutrition specialist (dietitian) to help you make healthy food choices. What general guidelines should I follow? Eating plan  Choose healthy foods low in fat, such as fruits, vegetables, whole grains, low-fat dairy products, and lean meat, fish, and  poultry.  Eat frequent, small meals instead of three large meals each day. Eat your meals slowly, in a relaxed setting. Avoid bending over or lying down until 2-3 hours after eating.  Limit high-fat foods such as fatty meats or fried foods.  Limit your intake of oils, butter, and shortening to less than 8 teaspoons each day.  Avoid the following: ? Foods that cause symptoms. These may be different for different people. Keep a food diary to keep track of foods that cause symptoms. ? Alcohol. ? Drinking large amounts of liquid with meals. ? Eating meals during the 2-3 hours before bed.  Cook foods using methods other than frying. This may include baking, grilling, or broiling. Lifestyle  Maintain a healthy weight. Ask your health care provider what weight is healthy for you. If you need to lose weight, work with your health care provider to do so safely.  Exercise for at least 30 minutes on 5 or more days each week, or as told by your health care provider.  Avoid wearing clothes that fit tightly around your waist and chest.  Do not use any products that contain nicotine or tobacco, such as cigarettes and e-cigarettes. If you need help quitting, ask your health care provider.  Sleep with the head of your bed raised. Use a wedge under the mattress or blocks under the bed frame to raise the head of the bed. What foods are not recommended? The items listed may not be a complete list. Talk with your dietitian about what dietary choices are best for you. Grains Pastries or quick breads with added fat. [place] toast. Vegetables Deep fried vegetables. [place] fries. Any vegetables prepared with added fat. Any vegetables that cause symptoms. For some people this may include tomatoes and tomato products, chili peppers, onions and garlic, and horseradish. Fruits Any fruits prepared with added fat. Any fruits that cause symptoms. For some people this may include citrus fruits, such as oranges,  grapefruit, pineapple, and lemons. Meats and other protein foods High-fat meats, such as fatty beef or pork, hot dogs, ribs, ham, sausage, salami and bacon. Fried meat or protein, including fried fish and fried chicken. Nuts and nut butters. Dairy Whole milk and chocolate milk. Sour cream. Cream. Ice cream. Cream cheese. Milk shakes. Beverages Coffee and tea, with or without caffeine. Carbonated beverages. Sodas. Energy drinks. Fruit juice made with acidic fruits (such as orange or grapefruit). Tomato juice. Alcoholic drinks. Fats and oils Butter. Margarine. Shortening. Ghee. Sweets and desserts Chocolate and cocoa. Donuts. Seasoning and other foods Pepper. Peppermint and spearmint. Any condiments, herbs, or seasonings that cause symptoms. For some people, this may include curry, hot sauce, or vinegar-based salad dressings. Summary  When you have gastroesophageal reflux disease (GERD), food and lifestyle choices are very important to help ease the discomfort of GERD.  Eat frequent, small meals instead of three large meals each day. Eat your meals slowly, in a relaxed setting. Avoid bending over or lying down until 2-3 hours after eating.  Limit high-fat foods such as fatty meat or fried foods. This information is not intended to replace advice given to you by your health care provider. Make sure you discuss any questions you have with your health care provider. Document Released: 08/22/2005 Document Revised: 08/23/2016 Document Reviewed: 08/23/2016 Elsevier Interactive Patient Education  [employer].

## 2018-01-24 NOTE — Progress Notes (Signed)
This very nice 67 y.o. WWF presents for 6 month follow up with HTN, HLD, Pre-Diabetes and Vitamin D Deficiency. Patient's GERD is controlled on her meds.     Patient is followed expectantly for Labile  HTN since 2005  & BP has been controlled at home. Today's BP is at goal - 136/80. She had a negative Cardiolite scan in 2010. Patient has had no complaints of any cardiac type chest pain, palpitations, dyspnea / orthopnea / PND, dizziness, claudication, or dependent edema.     Hyperlipidemia is controlled with diet & meds. Patient denies myalgias or other med SE's. Last Lipids were  Lab Results  Component Value Date   CHOL 185 10/24/2017   HDL 54 10/24/2017   LDLCALC 101 (H) 10/24/2017   TRIG 179 (H) 10/24/2017   CHOLHDL 3.4 10/24/2017      Also, the patient is followed expectantly for PreDiabetes and has had no symptoms of reactive hypoglycemia, diabetic polys, paresthesias or visual blurring.  Last A1c was at goal: Lab Results  Component Value Date   HGBA1C 4.9 07/24/2017      Further, the patient also has history of Vitamin D Deficiency ("10"/2008)  and supplements vitamin D without any suspected side-effects. Last vitamin D was still low (goal 70-100):  Lab Results  Component Value Date   VD25OH 45 07/24/2017   Current Outpatient Medications on File Prior to Visit  Medication Sig  . aspirin EC 81 MG tablet Take 81 mg by mouth daily.  Marland Kitchen CALCIUM PO Take 1 tablet by mouth 3 (three) times a week. No specific days.  . Cholecalciferol (VITAMIN D PO) Take 5,000 Units by mouth daily. No specific days.  Marland Kitchen ezetimibe (ZETIA) 10 MG tablet Take 1 tablet daily for cholesterol  . hyoscyamine (LEVSIN SL) 0.125 MG SL tablet Place 1 tablet (0.125 mg total) every 4 (four) hours as needed under the tongue.  Marland Kitchen omeprazole (PRILOSEC) 40 MG capsule Take 40 mg by mouth daily.   Marland Kitchen oxybutynin (DITROPAN) 5 MG tablet Take 5 mg by mouth. Takes 1/2 to 1 tablet 2 to 3 times a day as needed for bladder.  .  sertraline (ZOLOFT) 100 MG tablet Take1 tablet  Daily for Mood (Patient taking differently: Take 1/2 tablet  Daily for Mood)   No current facility-administered medications on file prior to visit.    Allergies  Allergen Reactions  . Lipitor [Atorvastatin] Other (See Comments)    Fatigue.   PMHx:   Past Medical History:  Diagnosis Date  . B12 deficiency   . High cholesterol   . Hypertension   . IBS (irritable bowel syndrome)   . Vitamin D deficiency    Immunization History  Administered Date(s) Administered  . PPD Test 03/19/2014, 03/24/2015, 06/01/2016  . Pneumococcal Conjugate-13 07/24/2017  . Pneumococcal Polysaccharide-23 06/01/2016  . Tdap 01/24/2013  . Zoster 11/03/2013   Past Surgical History:  Procedure Laterality Date  . TUBAL LIGATION     FHx:    Reviewed / unchanged  SHx:    Reviewed / unchanged   Systems Review:  Constitutional: Denies fever, chills, wt changes, headaches, insomnia, fatigue, night sweats, change in appetite. Eyes: Denies redness, blurred vision, diplopia, discharge, itchy, watery eyes.  ENT: Denies discharge, congestion, post nasal drip, epistaxis, sore throat, earache, hearing loss, dental pain, tinnitus, vertigo, sinus pain, snoring.  CV: Denies chest pain, palpitations, irregular heartbeat, syncope, dyspnea, diaphoresis, orthopnea, PND, claudication or edema. Respiratory: denies cough, dyspnea, DOE, pleurisy, hoarseness, laryngitis,  wheezing.  Gastrointestinal: Denies dysphagia, odynophagia, heartburn, reflux, water brash, abdominal pain or cramps, nausea, vomiting, bloating, diarrhea, constipation, hematemesis, melena, hematochezia  or hemorrhoids. Genitourinary: Denies dysuria, frequency, urgency, nocturia, hesitancy, discharge, hematuria or flank pain. Musculoskeletal: Denies arthralgias, myalgias, stiffness, jt. swelling, pain, limping or strain/sprain.  Skin: Denies pruritus, rash, hives, warts, acne, eczema or change in skin  lesion(s). Neuro: No weakness, tremor, incoordination, spasms, paresthesia or pain. Psychiatric: Denies confusion, memory loss or sensory loss. Endo: Denies change in weight, skin or hair change.  Heme/Lymph: No excessive bleeding, bruising or enlarged lymph nodes.  Physical Exam  BP 136/80   Pulse 68   Temp (!) 97.5 F (36.4 C)   Resp 16   Ht 5\' 11"  (1.803 m)   Wt 171 lb 3.2 oz (77.7 kg)   BMI 23.88 kg/m   Appears  well nourished, well groomed  and in no distress.  Eyes: PERRLA, EOMs, conjunctiva no swelling or erythema. Sinuses: No frontal/maxillary tenderness ENT/Mouth: EAC's clear, TM's nl w/o erythema, bulging. Nares clear w/o erythema, swelling, exudates. Oropharynx clear without erythema or exudates. Oral hygiene is good. Tongue normal, non obstructing. Hearing intact.  Neck: Supple. Thyroid not palpable. Car 2+/2+ without bruits, nodes or JVD. Chest: Respirations nl with BS clear & equal w/o rales, rhonchi, wheezing or stridor.  Cor: Heart sounds normal w/ regular rate and rhythm without sig. murmurs, gallops, clicks or rubs. Peripheral pulses normal and equal  without edema.  Abdomen: Soft & bowel sounds normal. Non-tender w/o guarding, rebound, hernias, masses or organomegaly.  Lymphatics: Unremarkable.  Musculoskeletal: Full ROM all peripheral extremities, joint stability, 5/5 strength and normal gait.  Skin: Warm, dry without exposed rashes, lesions or ecchymosis apparent.  Neuro: Cranial nerves intact, reflexes equal bilaterally. Sensory-motor testing grossly intact. Tendon reflexes grossly intact.  Pysch: Alert & oriented x 3.  Insight and judgement nl & appropriate. No ideations.  Assessment and Plan:  1. Essential hypertension  - Continue medication, monitor blood pressure at home.  - Continue DASH diet.  Reminder to go to the ER if any CP,  SOB, nausea, dizziness, severe HA, changes vision/speech.  - CBC with Differential/Platelet - COMPLETE METABOLIC PANEL  WITH GFR - Magnesium - TSH  2. Hyperlipidemia, mixed  - Continue diet/meds, exercise,& lifestyle modifications.  - Continue monitor periodic cholesterol/liver & renal functions   - Lipid panel - TSH  3. Abnormal glucose  - Continue diet, exercise, lifestyle modifications.  - Monitor appropriate labs.  - Hemoglobin A1c - Insulin, random  4. Vitamin D deficiency  - Continue supplementation.   - VITAMIN D 25 Hydroxyl  5. Gastroesophageal reflux disease  - CBC with Differential/Platelet  6. Medication management  - CBC with Differential/Platelet - COMPLETE METABOLIC PANEL WITH GFR - Magnesium - Lipid panel - TSH - Hemoglobin A1c - Insulin, random - VITAMIN D 25 Hydroxyl          Discussed  regular exercise, BP monitoring, weight control to achieve/maintain BMI less than 25 and discussed med and SE's. Recommended labs to assess and monitor clinical status with further disposition pending results of labs. Over 30 minutes of exam, counseling, chart review was performed.

## 2018-01-25 ENCOUNTER — Encounter: Payer: Self-pay | Admitting: Internal Medicine

## 2018-01-25 LAB — LIPID PANEL
Cholesterol: 195 mg/dL (ref <200)
HDL: 56 mg/dL (ref >50)
LDL Cholesterol (Calc): 109 mg/dL (calc) — ABNORMAL HIGH
Non-HDL Cholesterol (Calc): 139 mg/dL (calc) — ABNORMAL HIGH (ref <130)
Total CHOL/HDL Ratio: 3.5 (calc) (ref <5.0)
Triglycerides: 180 mg/dL — ABNORMAL HIGH (ref <150)

## 2018-01-25 LAB — COMPLETE METABOLIC PANEL WITH GFR
AG Ratio: 1.7 (calc) (ref 1.0–2.5)
ALT: 25 U/L (ref 6–29)
AST: 22 U/L (ref 10–35)
Albumin: 4.1 g/dL (ref 3.6–5.1)
Alkaline phosphatase (APISO): 57 U/L (ref 33–130)
BUN/Creatinine Ratio: 13 (calc) (ref 6–22)
BUN: 14 mg/dL (ref 7–25)
CO2: 29 mmol/L (ref 20–32)
Calcium: 9.3 mg/dL (ref 8.6–10.4)
Chloride: 105 mmol/L (ref 98–110)
Creat: 1.08 mg/dL — ABNORMAL HIGH (ref 0.50–0.99)
GFR, Est African American: 62 mL/min/{1.73_m2} (ref >=60)
GFR, Est Non African American: 53 mL/min/{1.73_m2} — ABNORMAL LOW (ref >=60)
Globulin: 2.4 g/dL (calc) (ref 1.9–3.7)
Glucose, Bld: 85 mg/dL (ref 65–99)
Potassium: 4.6 mmol/L (ref 3.5–5.3)
Sodium: 139 mmol/L (ref 135–146)
Total Bilirubin: 0.4 mg/dL (ref 0.2–1.2)
Total Protein: 6.5 g/dL (ref 6.1–8.1)

## 2018-01-25 LAB — CBC WITH DIFFERENTIAL/PLATELET
Basophils Absolute: 19 cells/uL (ref 0–200)
Basophils Relative: 0.4 %
Eosinophils Absolute: 221 cells/uL (ref 15–500)
Eosinophils Relative: 4.7 %
HCT: 39.3 % (ref 35.0–45.0)
Hemoglobin: 14 g/dL (ref 11.7–15.5)
Lymphs Abs: 1767 cells/uL (ref 850–3900)
MCH: 34.1 pg — ABNORMAL HIGH (ref 27.0–33.0)
MCHC: 35.6 g/dL (ref 32.0–36.0)
MCV: 95.9 fL (ref 80.0–100.0)
MPV: 10.5 fL (ref 7.5–12.5)
Monocytes Relative: 11.6 %
Neutro Abs: 2148 cells/uL (ref 1500–7800)
Neutrophils Relative %: 45.7 %
Platelets: 223 10*3/uL (ref 140–400)
RBC: 4.1 10*6/uL (ref 3.80–5.10)
RDW: 12.2 % (ref 11.0–15.0)
Total Lymphocyte: 37.6 %
WBC mixed population: 545 cells/uL (ref 200–950)
WBC: 4.7 10*3/uL (ref 3.8–10.8)

## 2018-01-25 LAB — VITAMIN D 25 HYDROXY (VIT D DEFICIENCY, FRACTURES): Vit D, 25-Hydroxy: 34 ng/mL (ref 30–100)

## 2018-01-25 LAB — HEMOGLOBIN A1C
Hgb A1c MFr Bld: 4.8 % of total Hgb (ref <5.7)
Mean Plasma Glucose: 91 (calc)
eAG (mmol/L): 5 (calc)

## 2018-01-25 LAB — INSULIN, RANDOM: Insulin: 3.8 u[IU]/mL (ref 2.0–19.6)

## 2018-01-25 LAB — MAGNESIUM: Magnesium: 2.1 mg/dL (ref 1.5–2.5)

## 2018-01-25 LAB — TSH: TSH: 0.47 mIU/L (ref 0.40–4.50)

## 2018-01-26 ENCOUNTER — Encounter: Payer: Self-pay | Admitting: *Deleted

## 2018-02-19 ENCOUNTER — Encounter: Payer: Self-pay | Admitting: Internal Medicine

## 2018-05-16 ENCOUNTER — Other Ambulatory Visit: Payer: Self-pay | Admitting: Internal Medicine

## 2018-05-16 DIAGNOSIS — E782 Mixed hyperlipidemia: Secondary | ICD-10-CM

## 2018-05-16 NOTE — Progress Notes (Deleted)
FOLLOW UP  Assessment and Plan:   Hypertension Well controlled with current medications  Monitor blood pressure at home; patient to call if consistently greater than 130/80 Continue DASH diet.   Reminder to go to the ER if any CP, SOB, nausea, dizziness, severe HA, changes vision/speech, left arm numbness and tingling and jaw pain.  Cholesterol Currently mildly above goal; hx of atorvastatin intolerance; continue zetia 10 mg daily, diet discussed Continue low cholesterol diet and exercise.  Check lipid panel.   Other abnormal glucose Recent A1Cs at goal Discussed diet/exercise, weight management  Defer A1C; check CMP  BMI 24 Continue to recommend diet heavy in fruits and veggies and low in animal meats, cheeses, and dairy products, appropriate calorie intake Discuss exercise recommendations routinely Continue to monitor weight at each visit  Vitamin D Def Below goal at last visit; she has *** changed dose continue supplementation to maintain goal of 70-100 *** Vit D level  Anxiety Well managed by current regimen; continue medications Stress management techniques discussed, increase water, good sleep hygiene discussed, increase exercise, and increase veggies.   Smoking cessation -  instruction/counseling given, counseled patient on the dangers of tobacco use, advised patient to stop smoking, and reviewed strategies to maximize success, patient not ready to quit at this time.      Continue diet and meds as discussed. Further disposition pending results of labs. Discussed med's effects and SE's.   Over 30 minutes of exam, counseling, chart review, and critical decision making was performed.   Future Appointments  Date Time Provider [place]  05/17/2018  4:30 PM Liane Comber, NP [facility] None  09/04/2018  3:00 PM Unk Pinto, MD [facility] None     ----------------------------------------------------------------------------------------------------------------------  HPI 67 y.o. female  presents for 3 month follow up on hypertension, cholesterol, glucose management, weight and vitamin D deficiency. She had a negative Cardiolite scan in 2010.  Anxiety is reportedly well controlled on current regimen of zoloft 50 mg daily.   She currently continues to smoke ~1 pack a day; discussed risks associated with smoking, patient is not ready to quit.   BMI is There is no height or weight on file to calculate BMI., she {HAS HAS KKX:38182} been working on diet and exercise. Avoids meat, focuses on vegetables and fruit.  Wt Readings from Last 3 Encounters:  01/24/18 171 lb 3.2 oz (77.7 kg)  10/30/17 177 lb (80.3 kg)  10/24/17 177 lb (80.3 kg)   Her blood pressure {HAS HAS NOT:18834} been controlled at home, today their BP is    She {DOES_DOES XHB:71696} workout. She denies chest pain, shortness of breath, dizziness.   She is on cholesterol medication (zetia 10 mg daily) and denies myalgias. Her cholesterol is not at goal. The cholesterol last visit was:   Lab Results  Component Value Date   CHOL 195 01/24/2018   HDL 56 01/24/2018   LDLCALC 109 (H) 01/24/2018   TRIG 180 (H) 01/24/2018   CHOLHDL 3.5 01/24/2018    She {Has/has not:18111} been working on diet and exercise for glucose management, and denies {Symptoms; diabetes w/o none:19199}. Last A1C in the office was:  Lab Results  Component Value Date   HGBA1C 4.8 01/24/2018   Patient is *** on Vitamin D supplement.   Lab Results  Component Value Date   VD25OH 34 01/24/2018        Current Medications:  Current Outpatient Medications on File Prior to Visit  Medication Sig  . aspirin EC 81 MG tablet Take  81 mg by mouth daily.  Marland Kitchen CALCIUM PO Take 1 tablet by mouth 3 (three) times a week. No specific days.  . Cholecalciferol (VITAMIN D PO) Take 5,000 Units by mouth daily. No  specific days.  Marland Kitchen ezetimibe (ZETIA) 10 MG tablet Take 1 tablet daily for cholesterol  . hyoscyamine (LEVSIN SL) 0.125 MG SL tablet Place 1 tablet (0.125 mg total) every 4 (four) hours as needed under the tongue.  Marland Kitchen omeprazole (PRILOSEC) 40 MG capsule Take 40 mg by mouth daily.   Marland Kitchen oxybutynin (DITROPAN) 5 MG tablet Take 5 mg by mouth. Takes 1/2 to 1 tablet 2 to 3 times a day as needed for bladder.  . sertraline (ZOLOFT) 100 MG tablet Take1 tablet  Daily for Mood (Patient taking differently: Take 1/2 tablet  Daily for Mood)   No current facility-administered medications on file prior to visit.      Allergies:  Allergies  Allergen Reactions  . Lipitor [Atorvastatin] Other (See Comments)    Fatigue.     Medical History:  Past Medical History:  Diagnosis Date  . B12 deficiency   . High cholesterol   . Hypertension   . IBS (irritable bowel syndrome)   . Vitamin D deficiency    Family history- Reviewed and unchanged Social history- Reviewed and unchanged   Review of Systems:  Review of Systems  Constitutional: Negative for malaise/fatigue and weight loss.  HENT: Negative for hearing loss and tinnitus.   Eyes: Negative for blurred vision and double vision.  Respiratory: Negative for cough, shortness of breath and wheezing.   Cardiovascular: Negative for chest pain, palpitations, orthopnea, claudication and leg swelling.  Gastrointestinal: Negative for abdominal pain, blood in stool, constipation, diarrhea, heartburn, melena, nausea and vomiting.  Genitourinary: Negative.   Musculoskeletal: Negative for joint pain and myalgias.  Skin: Negative for rash.  Neurological: Negative for dizziness, tingling, sensory change, weakness and headaches.  Endo/Heme/Allergies: Negative for polydipsia.  Psychiatric/Behavioral: Negative.   All other systems reviewed and are negative.     Physical Exam: There were no vitals taken for this visit. Wt Readings from Last 3 Encounters:  01/24/18  171 lb 3.2 oz (77.7 kg)  10/30/17 177 lb (80.3 kg)  10/24/17 177 lb (80.3 kg)   General Appearance: Well nourished, in no apparent distress. Eyes: PERRLA, EOMs, conjunctiva no swelling or erythema Sinuses: No Frontal/maxillary tenderness ENT/Mouth: Ext aud canals clear, TMs without erythema, bulging. No erythema, swelling, or exudate on post pharynx.  Tonsils not swollen or erythematous. Hearing normal.  Neck: Supple, thyroid normal.  Respiratory: Respiratory effort normal, BS equal bilaterally without rales, rhonchi, wheezing or stridor.  Cardio: RRR with no MRGs. Brisk peripheral pulses without edema.  Abdomen: Soft, + BS.  Non tender, no guarding, rebound, hernias, masses. Lymphatics: Non tender without lymphadenopathy.  Musculoskeletal: Full ROM, 5/5 strength, {PSY - GAIT AND STATION:22860} gait Skin: Warm, dry without rashes, lesions, ecchymosis.  Neuro: Cranial nerves intact. No cerebellar symptoms.  Psych: Awake and oriented X 3, normal affect, Insight and Judgment appropriate.    Izora Ribas, NP 12:49 PM [facility]

## 2018-05-17 ENCOUNTER — Ambulatory Visit: Payer: Self-pay | Admitting: Adult Health

## 2018-06-11 NOTE — Progress Notes (Signed)
FOLLOW UP  Assessment and Plan:   Hypertension Well controlled with current medications  Monitor blood pressure at home; patient to call if consistently greater than 130/80 Continue DASH diet.   Reminder to go to the ER if any CP, SOB, nausea, dizziness, severe HA, changes vision/speech, left arm numbness and tingling and jaw pain.  Cholesterol Currently mildly above goal; hx of atorvastatin intolerance; continue zetia 10 mg daily, diet discussed Continue low cholesterol diet and exercise.  Check lipid panel.   Other abnormal glucose Recent A1Cs at goal Discussed diet/exercise, weight management  Defer A1C; check CMP  BMI 24 Continue to recommend diet heavy in fruits and veggies and low in animal meats, cheeses, and dairy products, appropriate calorie intake Discuss exercise recommendations routinely Continue to monitor weight at each visit  Vitamin D Def Below goal at last visit; she has not changed dose, plan on increasing dose to 10000 if remains below goal continue supplementation to maintain goal of 70-100 Check Vit D level  Anxiety Well managed by current regimen; continue medications for now, plan to taper off next spring after d/cing smoking Stress management techniques discussed, increase water, good sleep hygiene discussed, increase exercise, and increase veggies.   Smoking cessation -  instruction/counseling given, counseled patient on the dangers of tobacco use, advised patient to stop smoking, and reviewed strategies to maximize success, patient not ready to quit at this time. Plans to try quitting after the new years.    Continue diet and meds as discussed. Further disposition pending results of labs. Discussed med's effects and SE's.   Over 30 minutes of exam, counseling, chart review, and critical decision making was performed.   Future Appointments  Date Time Provider [place]  09/04/2018  3:00 PM Unk Pinto, MD [facility] None     ----------------------------------------------------------------------------------------------------------------------  HPI 67 y.o. female  presents for 3 month follow up on hypertension, cholesterol, glucose management, weight and vitamin D deficiency. She had a negative Cardiolite scan in 2010.  Anxiety is reportedly well controlled on current regimen of zoloft 50 mg daily. She has been on this medication for 3-4 years. 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   She currently continues to smoke ~1 pack a day; discussed risks associated with smoking, patient is not ready to quit.   BMI is Body mass index is 24.27 kg/m., she has been working on diet and exercise. Avoids meat, focuses on vegetables and fruit.  Wt Readings from Last 3 Encounters:  06/12/18 174 lb (78.9 kg)  01/24/18 171 lb 3.2 oz (77.7 kg)  10/30/17 177 lb (80.3 kg)   Her blood pressure has been controlled at home, today their BP is BP: 122/60  She does workout. She denies chest pain, shortness of breath, dizziness.   She is on cholesterol medication (zetia 10 mg daily) and denies myalgias. Her cholesterol is not at goal. The cholesterol last visit was:   Lab Results  Component Value Date   CHOL 195 01/24/2018   HDL 56 01/24/2018   LDLCALC 109 (H) 01/24/2018   TRIG 180 (H) 01/24/2018   CHOLHDL 3.5 01/24/2018    She has been working on diet and exercise for glucose management, and denies foot ulcerations, increased appetite, nausea, paresthesia of the feet, polydipsia, polyuria and visual disturbances. Last A1C in the office was:  Lab Results  Component Value Date   HGBA1C 4.8 01/24/2018   Patient is on Vitamin  D supplement.   Lab Results  Component Value Date   VD25OH 34 01/24/2018        Current Medications:  Current Outpatient Medications on File Prior to Visit  Medication Sig  . aspirin EC 81 MG tablet Take 81 mg by mouth  daily.  Marland Kitchen CALCIUM PO Take 1 tablet by mouth 3 (three) times a week. No specific days.  . Cholecalciferol (VITAMIN D PO) Take 5,000 Units by mouth daily. No specific days.  Marland Kitchen ezetimibe (ZETIA) 10 MG tablet TAKE 1 TABLET DAILY FOR CHOLESTEROL  . hyoscyamine (LEVSIN SL) 0.125 MG SL tablet Place 1 tablet (0.125 mg total) every 4 (four) hours as needed under the tongue.  Marland Kitchen omeprazole (PRILOSEC) 40 MG capsule Take 40 mg by mouth daily.   Marland Kitchen oxybutynin (DITROPAN) 5 MG tablet Take 5 mg by mouth. Takes 1/2 to 1 tablet 2 to 3 times a day as needed for bladder.  . sertraline (ZOLOFT) 100 MG tablet Take1 tablet  Daily for Mood (Patient taking differently: Take 1/2 tablet  Daily for Mood)   No current facility-administered medications on file prior to visit.      Allergies:  Allergies  Allergen Reactions  . Lipitor [Atorvastatin] Other (See Comments)    Fatigue.     Medical History:  Past Medical History:  Diagnosis Date  . B12 deficiency   . High cholesterol   . Hypertension   . IBS (irritable bowel syndrome)   . Vitamin D deficiency    Family history- Reviewed and unchanged Social history- Reviewed and unchanged   Review of Systems:  Review of Systems  Constitutional: Negative for malaise/fatigue and weight loss.  HENT: Negative for hearing loss and tinnitus.   Eyes: Negative for blurred vision and double vision.  Respiratory: Negative for cough, shortness of breath and wheezing.   Cardiovascular: Negative for chest pain, palpitations, orthopnea, claudication and leg swelling.  Gastrointestinal: Negative for abdominal pain, blood in stool, constipation, diarrhea, heartburn, melena, nausea and vomiting.  Genitourinary: Negative.   Musculoskeletal: Negative for joint pain and myalgias.  Skin: Negative for rash.  Neurological: Negative for dizziness, tingling, sensory change, weakness and headaches.  Endo/Heme/Allergies: Negative for polydipsia.  Psychiatric/Behavioral: Negative.    All other systems reviewed and are negative.     Physical Exam: BP 122/60   Pulse 63   Temp (!) 97.3 F (36.3 C)   Ht 5\' 11"  (1.803 m)   Wt 174 lb (78.9 kg)   SpO2 98%   BMI 24.27 kg/m  Wt Readings from Last 3 Encounters:  06/12/18 174 lb (78.9 kg)  01/24/18 171 lb 3.2 oz (77.7 kg)  10/30/17 177 lb (80.3 kg)   General Appearance: Well nourished, in no apparent distress. Eyes: PERRLA, EOMs, conjunctiva no swelling or erythema Sinuses: No Frontal/maxillary tenderness ENT/Mouth: Ext aud canals clear, TMs without erythema, bulging. No erythema, swelling, or exudate on post pharynx.  Tonsils not swollen or erythematous. Hearing normal.  Neck: Supple, thyroid normal.  Respiratory: Respiratory effort normal, BS equal bilaterally without rales, rhonchi, wheezing or stridor.  Cardio: RRR with no MRGs. Brisk peripheral pulses without edema.  Abdomen: Soft, + BS.  Non tender, no guarding, rebound, hernias, masses. Lymphatics: Non tender without lymphadenopathy.  Musculoskeletal: Full ROM, 5/5 strength, Normal gait Skin: Warm, dry without rashes, lesions, ecchymosis.  Neuro: Cranial nerves intact. No cerebellar symptoms.  Psych: Awake and oriented X 3, normal affect, Insight and Judgment appropriate.    Izora Ribas, NP 4:14 PM [facility]

## 2018-06-12 ENCOUNTER — Encounter: Payer: Self-pay | Admitting: Adult Health

## 2018-06-12 ENCOUNTER — Ambulatory Visit (INDEPENDENT_AMBULATORY_CARE_PROVIDER_SITE_OTHER): Payer: Medicare Other | Admitting: Adult Health

## 2018-06-12 VITALS — BP 122/60 | HR 63 | Temp 97.3°F | Ht 71.0 in | Wt 174.0 lb

## 2018-06-12 DIAGNOSIS — R7309 Other abnormal glucose: Secondary | ICD-10-CM

## 2018-06-12 DIAGNOSIS — F17200 Nicotine dependence, unspecified, uncomplicated: Secondary | ICD-10-CM

## 2018-06-12 DIAGNOSIS — E782 Mixed hyperlipidemia: Secondary | ICD-10-CM

## 2018-06-12 DIAGNOSIS — Z79899 Other long term (current) drug therapy: Secondary | ICD-10-CM

## 2018-06-12 DIAGNOSIS — I700 Atherosclerosis of aorta: Secondary | ICD-10-CM | POA: Diagnosis not present

## 2018-06-12 DIAGNOSIS — I10 Essential (primary) hypertension: Secondary | ICD-10-CM | POA: Diagnosis not present

## 2018-06-12 DIAGNOSIS — F419 Anxiety disorder, unspecified: Secondary | ICD-10-CM | POA: Diagnosis not present

## 2018-06-12 DIAGNOSIS — Z6824 Body mass index (BMI) 24.0-24.9, adult: Secondary | ICD-10-CM | POA: Diagnosis not present

## 2018-06-12 DIAGNOSIS — E559 Vitamin D deficiency, unspecified: Secondary | ICD-10-CM | POA: Diagnosis not present

## 2018-06-12 MED ORDER — EZETIMIBE 10 MG PO TABS: 10.0000 mg | ORAL_TABLET | Freq: Every day | ORAL | 1 refills | Status: DC

## 2018-06-12 NOTE — Patient Instructions (Signed)
Goals    . LDL CALC < 100        Preventing High Cholesterol Cholesterol is a waxy, fat-like substance that your body needs in small amounts. Your liver makes all the cholesterol that your body needs. Having high cholesterol (hypercholesterolemia) increases your risk for heart disease and stroke. Extra (excess) cholesterol comes from the food you eat, such as animal-based fat (saturated fat) from meat and some dairy products. High cholesterol can often be prevented with diet and lifestyle changes. If you already have high cholesterol, you can control it with diet and lifestyle changes, as well as medicine. What nutrition changes can be made?  Eat less saturated fat. Foods that contain saturated fat include red meat and some dairy products.  Avoid processed meats, like bacon and lunch meats.  Avoid trans fats, which are found in margarine and some baked goods.  Avoid foods and beverages that have added sugars.  Eat more fruits, vegetables, and whole grains.  Choose healthy sources of protein, such as fish, poultry, and nuts.  Choose healthy sources of fat, such as: ? Nuts. ? Vegetable oils, especially olive oil. ? Fish that have healthy fats (omega-3 fatty acids), such as mackerel or salmon. What lifestyle changes can be made?  Lose weight if you are overweight. Losing 5-10 lb (2.3-4.5 kg) can help prevent or control high cholesterol and reduce your risk for diabetes and high blood pressure. Ask your health care provider to help you with a diet and exercise plan to safely lose weight.  Get enough exercise. Do at least 150 minutes of moderate-intensity exercise each week. ? You could do this in short exercise sessions several times a day, or you could do longer exercise sessions a few times a week. For example, you could take a brisk 10-minute walk or bike ride, 3 times a day, for 5 days a week.  Do not smoke. If you need help quitting, ask your health care provider.  Limit your  alcohol intake. If you drink alcohol, limit alcohol intake to no more than 1 drink a day for nonpregnant women and 2 drinks a day for men. One drink equals 12 oz of beer, 5 oz of wine, or 1 oz of hard liquor. Why are these changes important? If you have high cholesterol, deposits (plaques) may build up on the walls of your blood vessels. Plaques make the arteries narrower and stiffer, which can restrict or block blood flow and cause blood clots to form. This greatly increases your risk for heart attack and stroke. Making diet and lifestyle changes can reduce your risk for these life-threatening conditions. What can I do to lower my risk?  Manage your risk factors for high cholesterol. Talk with your health care provider about all of your risk factors and how to lower your risk.  Manage other conditions that you have, such as diabetes or high blood pressure (hypertension).  Have your cholesterol checked at regular intervals.  Keep all follow-up visits as told by your health care provider. This is important. How is this treated? In addition to diet and lifestyle changes, your health care provider may recommend medicines to help lower cholesterol, such as a medicine to reduce the amount of cholesterol made in your liver. You may need medicine if:  Diet and lifestyle changes do not lower your cholesterol enough.  You have high cholesterol and other risk factors for heart disease or stroke.  Take over-the-counter and prescription medicines only as told by your health care  provider. Where to find more information:  American Heart Association: ThisTune.com.pt.jsp  National Heart, Lung, and Blood Institute: FrenchToiletries.com.cy Summary  High cholesterol increases your risk for heart disease and stroke. By keeping your cholesterol level low, you can reduce your risk for these  conditions.  Diet and lifestyle changes are the most important steps in preventing high cholesterol.  Work with your health care provider to manage your risk factors, and have your blood tested regularly. This information is not intended to replace advice given to you by your health care provider. Make sure you discuss any questions you have with your health care provider. Document Released: 09/06/2015 Document Revised: 04/30/2016 Document Reviewed: 04/30/2016 Elsevier Interactive Patient Education  [employer].

## 2018-06-13 ENCOUNTER — Other Ambulatory Visit: Payer: Self-pay | Admitting: Adult Health

## 2018-06-13 ENCOUNTER — Other Ambulatory Visit: Payer: Self-pay

## 2018-06-13 LAB — CBC WITH DIFFERENTIAL/PLATELET
Basophils Absolute: 30 cells/uL (ref 0–200)
Basophils Relative: 0.7 %
Eosinophils Absolute: 99 cells/uL (ref 15–500)
Eosinophils Relative: 2.3 %
HCT: 41.6 % (ref 35.0–45.0)
Hemoglobin: 14.7 g/dL (ref 11.7–15.5)
Lymphs Abs: 1565 cells/uL (ref 850–3900)
MCH: 34.1 pg — ABNORMAL HIGH (ref 27.0–33.0)
MCHC: 35.3 g/dL (ref 32.0–36.0)
MCV: 96.5 fL (ref 80.0–100.0)
MPV: 10.4 fL (ref 7.5–12.5)
Monocytes Relative: 8.7 %
Neutro Abs: 2232 cells/uL (ref 1500–7800)
Neutrophils Relative %: 51.9 %
Platelets: 226 10*3/uL (ref 140–400)
RBC: 4.31 10*6/uL (ref 3.80–5.10)
RDW: 12 % (ref 11.0–15.0)
Total Lymphocyte: 36.4 %
WBC mixed population: 374 cells/uL (ref 200–950)
WBC: 4.3 10*3/uL (ref 3.8–10.8)

## 2018-06-13 LAB — COMPLETE METABOLIC PANEL WITH GFR
AG Ratio: 1.9 (calc) (ref 1.0–2.5)
ALT: 16 U/L (ref 6–29)
AST: 15 U/L (ref 10–35)
Albumin: 4.2 g/dL (ref 3.6–5.1)
Alkaline phosphatase (APISO): 50 U/L (ref 33–130)
BUN: 13 mg/dL (ref 7–25)
CO2: 29 mmol/L (ref 20–32)
Calcium: 9.3 mg/dL (ref 8.6–10.4)
Chloride: 103 mmol/L (ref 98–110)
Creat: 0.82 mg/dL (ref 0.50–0.99)
GFR, Est African American: 86 mL/min/{1.73_m2} (ref >=60)
GFR, Est Non African American: 75 mL/min/{1.73_m2} (ref >=60)
Globulin: 2.2 g/dL (calc) (ref 1.9–3.7)
Glucose, Bld: 86 mg/dL (ref 65–99)
Potassium: 4.2 mmol/L (ref 3.5–5.3)
Sodium: 138 mmol/L (ref 135–146)
Total Bilirubin: 0.5 mg/dL (ref 0.2–1.2)
Total Protein: 6.4 g/dL (ref 6.1–8.1)

## 2018-06-13 LAB — LIPID PANEL
Cholesterol: 212 mg/dL — ABNORMAL HIGH (ref <200)
HDL: 64 mg/dL (ref >50)
LDL Cholesterol (Calc): 124 mg/dL (calc) — ABNORMAL HIGH
Non-HDL Cholesterol (Calc): 148 mg/dL (calc) — ABNORMAL HIGH (ref <130)
Total CHOL/HDL Ratio: 3.3 (calc) (ref <5.0)
Triglycerides: 128 mg/dL (ref <150)

## 2018-06-13 LAB — TSH: TSH: 0.54 mIU/L (ref 0.40–4.50)

## 2018-06-13 LAB — MAGNESIUM: Magnesium: 2 mg/dL (ref 1.5–2.5)

## 2018-06-13 LAB — VITAMIN D 25 HYDROXY (VIT D DEFICIENCY, FRACTURES): Vit D, 25-Hydroxy: 39 ng/mL (ref 30–100)

## 2018-06-13 MED ORDER — PRAVASTATIN SODIUM 40 MG PO TABS: ORAL_TABLET | ORAL | 0 refills | Status: DC

## 2018-06-29 DIAGNOSIS — Z23 Encounter for immunization: Secondary | ICD-10-CM | POA: Diagnosis not present

## 2018-09-04 ENCOUNTER — Encounter: Payer: Self-pay | Admitting: Internal Medicine

## 2018-10-04 ENCOUNTER — Encounter: Payer: Self-pay | Admitting: Internal Medicine

## 2018-10-04 ENCOUNTER — Ambulatory Visit (INDEPENDENT_AMBULATORY_CARE_PROVIDER_SITE_OTHER): Payer: Medicare Other | Admitting: Internal Medicine

## 2018-10-04 VITALS — BP 140/76 | HR 74 | Temp 97.3°F | Ht 70.0 in | Wt 175.4 lb

## 2018-10-04 DIAGNOSIS — Z79899 Other long term (current) drug therapy: Secondary | ICD-10-CM | POA: Diagnosis not present

## 2018-10-04 DIAGNOSIS — F341 Dysthymic disorder: Secondary | ICD-10-CM | POA: Diagnosis not present

## 2018-10-04 DIAGNOSIS — Z136 Encounter for screening for cardiovascular disorders: Secondary | ICD-10-CM | POA: Diagnosis not present

## 2018-10-04 DIAGNOSIS — K219 Gastro-esophageal reflux disease without esophagitis: Secondary | ICD-10-CM | POA: Diagnosis not present

## 2018-10-04 DIAGNOSIS — E559 Vitamin D deficiency, unspecified: Secondary | ICD-10-CM

## 2018-10-04 DIAGNOSIS — F17200 Nicotine dependence, unspecified, uncomplicated: Secondary | ICD-10-CM

## 2018-10-04 DIAGNOSIS — Z8249 Family history of ischemic heart disease and other diseases of the circulatory system: Secondary | ICD-10-CM

## 2018-10-04 DIAGNOSIS — N182 Chronic kidney disease, stage 2 (mild): Secondary | ICD-10-CM

## 2018-10-04 DIAGNOSIS — E782 Mixed hyperlipidemia: Secondary | ICD-10-CM

## 2018-10-04 DIAGNOSIS — I10 Essential (primary) hypertension: Secondary | ICD-10-CM

## 2018-10-04 DIAGNOSIS — Z1211 Encounter for screening for malignant neoplasm of colon: Secondary | ICD-10-CM

## 2018-10-04 DIAGNOSIS — R7309 Other abnormal glucose: Secondary | ICD-10-CM

## 2018-10-04 DIAGNOSIS — N3281 Overactive bladder: Secondary | ICD-10-CM

## 2018-10-04 DIAGNOSIS — Z1212 Encounter for screening for malignant neoplasm of rectum: Secondary | ICD-10-CM

## 2018-10-04 MED ORDER — EZETIMIBE 10 MG PO TABS: ORAL_TABLET | ORAL | 3 refills | Status: DC

## 2018-10-04 MED ORDER — SERTRALINE HCL 100 MG PO TABS: ORAL_TABLET | ORAL | 3 refills | Status: DC

## 2018-10-04 MED ORDER — OXYBUTYNIN CHLORIDE 5 MG PO TABS: ORAL_TABLET | ORAL | 3 refills | Status: DC

## 2018-10-04 NOTE — Patient Instructions (Signed)

## 2018-10-04 NOTE — Progress Notes (Signed)
[facility] Unk Pinto, M.D.     Uvaldo Bristle. Silverio Lay, P.A.-C Liane Comber, [place] [address] [place], [place] [address] Telephone [phone] Telefax [phone]  Comprehensive Evaluation &  Examination     This very nice 68 y.o. [facility]  presents for a  comprehensive evaluation and management of multiple medical co-morbidities.  Patient has been followed for HTN, HLD, Prediabetes  and Vitamin D Deficiency. Patient has GERD controlled with her meds.      Patient has been followed for Labile HTN since 2005.  In 2010, she had a Negative Cardiolite. Patient's BP has been controlled  And she denies any cardiac symptoms as chest pain, palpitations, shortness of breath, dizziness or ankle swelling. Today's BP is at goal - 140/76.      Patient's hyperlipidemia is not controlled with diet and hx/o intolerance to Lipitor & Gemfibrozil.  She currently is on qod Pravastatin & denies myalgias or other medication SE's. Last lipids were  Lab Results  Component Value Date   CHOL 212 (H) 06/12/2018   HDL 64 06/12/2018   LDLCALC 124 (H) 06/12/2018   TRIG 128 06/12/2018   CHOLHDL 3.3 06/12/2018      Patient is monitored expectantly for glucose intolerance and patient denies reactive hypoglycemic symptoms, visual blurring, diabetic polys or paresthesias. Last A1c was Normal & at goal: Lab Results  Component Value Date   HGBA1C 4.8 01/24/2018      Finally, patient has history of Vitamin D Deficiency  ("10" / 2008)  and last Vitamin D was still low: Lab Results  Component Value Date   VD25OH 39 06/12/2018   Current Outpatient Medications on File Prior to Visit  Medication Sig  . aspirin EC 81 MG tablet Take 81 mg by mouth daily.  Marland Kitchen CALCIUM PO Take 1 tablet by mouth 3 (three) times a week. No specific days.  . Cholecalciferol (VITAMIN D PO) Take 5,000 Units by mouth daily. No specific days.  Marland Kitchen ezetimibe (ZETIA) 10 MG  tablet Take 1 tablet (10 mg total) by mouth daily. for cholesterol  . hyoscyamine (LEVSIN SL) 0.125 MG SL tablet Place 1 tablet (0.125 mg total) every 4 (four) hours as needed under the tongue.  Marland Kitchen omeprazole (PRILOSEC) 40 MG capsule Take 40 mg by mouth daily.   Marland Kitchen oxybutynin (DITROPAN) 5 MG tablet Take 5 mg by mouth. Takes 1/2 to 1 tablet 2 to 3 times a day as needed for bladder.  . pravastatin (PRAVACHOL) 40 MG tablet Take 1/2 tablet by mouth on M/W/F  . sertraline (ZOLOFT) 100 MG tablet Take1 tablet  Daily for Mood (Patient taking differently: Take one tablet daily)   No current facility-administered medications on file prior to visit.    Allergies  Allergen Reactions  . Lipitor [Atorvastatin] Other (See Comments)    Fatigue.   Past Medical History:  Diagnosis Date  . B12 deficiency   . High cholesterol   . Hypertension   . IBS (irritable bowel syndrome)   . Vitamin D deficiency    Health Maintenance  Topic Date Due  . MAMMOGRAM  01/25/2013  . DEXA SCAN  07/07/2016  . INFLUENZA VACCINE  04/05/2018  . COLONOSCOPY  03/30/2020  . PNA vac Low Risk Adult (2 of 2 - PPSV23) 06/01/2021  . TETANUS/TDAP  01/25/2023  . Hepatitis C Screening  Completed   Immunization History  Administered Date(s) Administered  . PPD Test 03/19/2014, 03/24/2015, 06/01/2016  . Pneumococcal Conjugate-13  07/24/2017  . Pneumococcal Polysaccharide-23 06/01/2016  . Tdap 01/24/2013  . Zoster 11/03/2013   Last Colon - 03/30/2010 - Dr Fuller Plan - recc 10 yr f/u Colon  Last MGM - 02/01/2011  Past Surgical History:  Procedure Laterality Date  . TUBAL LIGATION     Family History  Problem Relation Age of Onset  . Heart failure Mother   . Osteoarthritis Father   . Hypertension Father   . COPD Father    Social History   Tobacco Use  . Smoking status: Current Some Day Smoker    Packs/day: 1.00    Years: 45.00    Pack years: 45.00    Types: Cigarettes  . Smokeless tobacco: Never Used  Substance Use  Topics  . Alcohol use: Yes    Comment: occasionally  . Drug use: No    ROS Constitutional: Denies fever, chills, weight loss/gain, headaches, insomnia,  night sweats, and change in appetite. Does c/o fatigue. Eyes: Denies redness, blurred vision, diplopia, discharge, itchy, watery eyes.  ENT: Denies discharge, congestion, post nasal drip, epistaxis, sore throat, earache, hearing loss, dental pain, Tinnitus, Vertigo, Sinus pain, snoring.  Cardio: Denies chest pain, palpitations, irregular heartbeat, syncope, dyspnea, diaphoresis, orthopnea, PND, claudication, edema Respiratory: denies cough, dyspnea, DOE, pleurisy, hoarseness, laryngitis, wheezing.  Gastrointestinal: Denies dysphagia, heartburn, reflux, water brash, pain, cramps, nausea, vomiting, bloating, diarrhea, constipation, hematemesis, melena, hematochezia, jaundice, hemorrhoids Genitourinary: Denies dysuria, frequency, urgency, nocturia, hesitancy, discharge, hematuria, flank pain Breast: Breast lumps, nipple discharge, bleeding.  Musculoskeletal: Denies arthralgia, myalgia, stiffness, Jt. Swelling, pain, limp, and strain/sprain. Denies falls. Skin: Denies puritis, rash, hives, warts, acne, eczema, changing in skin lesion Neuro: No weakness, tremor, incoordination, spasms, paresthesia, pain Psychiatric: Denies confusion, memory loss, sensory loss. Denies Depression. Endocrine: Denies change in weight, skin, hair change, nocturia, and paresthesia, diabetic polys, visual blurring, hyper / hypo glycemic episodes.  Heme/Lymph: No excessive bleeding, bruising, enlarged lymph nodes.  Physical Exam  BP 140/76   Pulse 74   Temp (!) 97.3 F (36.3 C)   Ht 5\' 10"  (1.778 m)   Wt 175 lb 6.4 oz (79.6 kg)   SpO2 98%   BMI 25.17 kg/m   General Appearance: Well nourished, well groomed and in no apparent distress.  Eyes: PERRLA, EOMs, conjunctiva no swelling or erythema, normal fundi and vessels. Sinuses: No frontal/maxillary  tenderness ENT/Mouth: EACs patent / TMs  nl. Nares clear without erythema, swelling, mucoid exudates. Oral hygiene is good. No erythema, swelling, or exudate. Tongue normal, non-obstructing. Tonsils not swollen or erythematous. Hearing normal.  Neck: Supple, thyroid not palpable. No bruits, nodes or JVD. Respiratory: Respiratory effort normal.  BS equal and clear bilateral without rales, rhonci, wheezing or stridor. Cardio: Heart sounds are normal with regular rate and rhythm and no murmurs, rubs or gallops. Peripheral pulses are normal and equal bilaterally without edema. No aortic or femoral bruits. Chest: symmetric with normal excursions and percussion. Breasts: Symmetric, without lumps, nipple discharge, retractions, or fibrocystic changes.  Abdomen: Flat, soft with bowel sounds active. Nontender, no guarding, rebound, hernias, masses, or organomegaly.  Lymphatics: Non tender without lymphadenopathy.  Genitourinary:  Musculoskeletal: Full ROM all peripheral extremities, joint stability, 5/5 strength, and normal gait. Skin: Warm and dry without rashes, lesions, cyanosis, clubbing or  ecchymosis.  Neuro: Cranial nerves intact, reflexes equal bilaterally. Normal muscle tone, no cerebellar symptoms. Sensation intact.  Pysch: Alert and oriented X 3, normal affect, Insight and Judgment appropriate.   Assessment and Plan  1. Essential hypertension  - EKG  12-Lead - Urinalysis, Routine w reflex microscopic - Microalbumin / creatinine urine ratio - CBC with Differential/Platelet - COMPLETE METABOLIC PANEL WITH GFR - Magnesium - TSH  2. Hyperlipidemia, mixed  - EKG 12-Lead - Lipid panel - TSH  3. Abnormal glucose  - EKG 12-Lead - Hemoglobin A1c - Insulin, random  4. Vitamin D deficiency  - VITAMIN D 25 Hydroxyl  5. Gastroesophageal reflux disease  - CBC with Differential/Platelet  6. CKD (chronic kidney disease) stage 2, GFR 60-89 ml/min  - Urinalysis, Routine w reflex  microscopic - Microalbumin / creatinine urine ratio  7. Smoker  - EKG 12-Lead  8. Screening for colorectal cancer  - POC Hemoccult Bld/Stl  9. Screening for ischemic heart disease  - EKG 12-Lead  10. FHx: heart disease  - EKG 12-Lead  11. Medication management  - Urinalysis, Routine w reflex microscopic - Microalbumin / creatinine urine ratio - CBC with Differential/Platelet - COMPLETE METABOLIC PANEL WITH GFR - Magnesium - Lipid panel - TSH - Hemoglobin A1c - Insulin, random - VITAMIN D 25 Hydroxyl       Patient was counseled in prudent diet to achieve/maintain BMI less than 25 for weight control, BP monitoring, regular exercise and medications. Discussed med's effects and SE's. Screening labs and tests as requested with regular follow-up as recommended. Over 40 minutes of exam, counseling, chart review and high complex critical decision making was performed.

## 2018-10-05 LAB — COMPLETE METABOLIC PANEL WITH GFR
AG Ratio: 1.8 (calc) (ref 1.0–2.5)
ALT: 12 U/L (ref 6–29)
AST: 15 U/L (ref 10–35)
Albumin: 3.9 g/dL (ref 3.6–5.1)
Alkaline phosphatase (APISO): 43 U/L (ref 33–130)
BUN: 9 mg/dL (ref 7–25)
CO2: 30 mmol/L (ref 20–32)
Calcium: 9.5 mg/dL (ref 8.6–10.4)
Chloride: 106 mmol/L (ref 98–110)
Creat: 0.88 mg/dL (ref 0.50–0.99)
GFR, Est African American: 79 mL/min/{1.73_m2} (ref >=60)
GFR, Est Non African American: 68 mL/min/{1.73_m2} (ref >=60)
Globulin: 2.2 g/dL (calc) (ref 1.9–3.7)
Glucose, Bld: 90 mg/dL (ref 65–99)
Potassium: 4.1 mmol/L (ref 3.5–5.3)
Sodium: 143 mmol/L (ref 135–146)
Total Bilirubin: 0.5 mg/dL (ref 0.2–1.2)
Total Protein: 6.1 g/dL (ref 6.1–8.1)

## 2018-10-05 LAB — URINALYSIS, ROUTINE W REFLEX MICROSCOPIC
Bacteria, UA: NONE SEEN /HPF
Bilirubin Urine: NEGATIVE
Glucose, UA: NEGATIVE
Hgb urine dipstick: NEGATIVE
Hyaline Cast: NONE SEEN /LPF
Ketones, ur: NEGATIVE
Nitrite: NEGATIVE
Protein, ur: NEGATIVE
RBC / HPF: NONE SEEN /HPF (ref 0–2)
Specific Gravity, Urine: 1.007 (ref 1.001–1.03)
Squamous Epithelial / LPF: NONE SEEN /HPF (ref <=5)
pH: 6.5 (ref 5.0–8.0)

## 2018-10-05 LAB — LIPID PANEL
Cholesterol: 170 mg/dL (ref <200)
HDL: 60 mg/dL (ref >50)
LDL Cholesterol (Calc): 84 mg/dL (calc)
Non-HDL Cholesterol (Calc): 110 mg/dL (calc) (ref <130)
Total CHOL/HDL Ratio: 2.8 (calc) (ref <5.0)
Triglycerides: 156 mg/dL — ABNORMAL HIGH (ref <150)

## 2018-10-05 LAB — HEMOGLOBIN A1C
Hgb A1c MFr Bld: 5 % of total Hgb (ref <5.7)
Mean Plasma Glucose: 97 (calc)
eAG (mmol/L): 5.4 (calc)

## 2018-10-05 LAB — CBC WITH DIFFERENTIAL/PLATELET
Absolute Monocytes: 603 cells/uL (ref 200–950)
Basophils Absolute: 42 cells/uL (ref 0–200)
Basophils Relative: 0.8 %
Eosinophils Absolute: 109 cells/uL (ref 15–500)
Eosinophils Relative: 2.1 %
HCT: 42.5 % (ref 35.0–45.0)
Hemoglobin: 15.1 g/dL (ref 11.7–15.5)
Lymphs Abs: 1570 cells/uL (ref 850–3900)
MCH: 34.8 pg — ABNORMAL HIGH (ref 27.0–33.0)
MCHC: 35.5 g/dL (ref 32.0–36.0)
MCV: 97.9 fL (ref 80.0–100.0)
MPV: 10.5 fL (ref 7.5–12.5)
Monocytes Relative: 11.6 %
Neutro Abs: 2876 cells/uL (ref 1500–7800)
Neutrophils Relative %: 55.3 %
Platelets: 229 10*3/uL (ref 140–400)
RBC: 4.34 10*6/uL (ref 3.80–5.10)
RDW: 11.8 % (ref 11.0–15.0)
Total Lymphocyte: 30.2 %
WBC: 5.2 10*3/uL (ref 3.8–10.8)

## 2018-10-05 LAB — MICROALBUMIN / CREATININE URINE RATIO
Creatinine, Urine: 41 mg/dL (ref 20–275)
Microalb, Ur: <0.2 mg/dL

## 2018-10-05 LAB — MAGNESIUM: Magnesium: 2.1 mg/dL (ref 1.5–2.5)

## 2018-10-05 LAB — TSH: TSH: 0.49 mIU/L (ref 0.40–4.50)

## 2018-10-05 LAB — INSULIN, RANDOM: Insulin: 14.1 u[IU]/mL (ref 2.0–19.6)

## 2018-10-05 LAB — VITAMIN D 25 HYDROXY (VIT D DEFICIENCY, FRACTURES): Vit D, 25-Hydroxy: 40 ng/mL (ref 30–100)

## 2018-10-07 ENCOUNTER — Encounter: Payer: Self-pay | Admitting: Internal Medicine

## 2018-10-07 DIAGNOSIS — K219 Gastro-esophageal reflux disease without esophagitis: Secondary | ICD-10-CM | POA: Insufficient documentation

## 2018-10-07 DIAGNOSIS — Z8249 Family history of ischemic heart disease and other diseases of the circulatory system: Secondary | ICD-10-CM

## 2018-10-07 DIAGNOSIS — F341 Dysthymic disorder: Secondary | ICD-10-CM | POA: Insufficient documentation

## 2018-10-07 DIAGNOSIS — N3281 Overactive bladder: Secondary | ICD-10-CM | POA: Insufficient documentation

## 2018-10-07 HISTORY — DX: Family history of ischemic heart disease and other diseases of the circulatory system: Z82.49

## 2018-10-08 ENCOUNTER — Other Ambulatory Visit: Payer: Self-pay

## 2018-10-21 DIAGNOSIS — N390 Urinary tract infection, site not specified: Secondary | ICD-10-CM | POA: Diagnosis not present

## 2018-11-07 DIAGNOSIS — N3000 Acute cystitis without hematuria: Secondary | ICD-10-CM | POA: Diagnosis not present

## 2018-11-07 DIAGNOSIS — N3001 Acute cystitis with hematuria: Secondary | ICD-10-CM | POA: Diagnosis not present

## 2019-01-07 DIAGNOSIS — J449 Chronic obstructive pulmonary disease, unspecified: Secondary | ICD-10-CM | POA: Insufficient documentation

## 2019-01-07 NOTE — Progress Notes (Deleted)
ANNUAL MEDICARE VISIT AND FOLLOW UP  Assessment:   Diagnoses and all orders for this visit:  Encounter for Annual Medicare Visit  Essential hypertension At goal; continue current plan Monitor blood pressure at home; call if consistently over 130/80 Continue DASH diet.   Reminder to go to the ER if any CP, SOB, nausea, dizziness, severe HA, changes vision/speech, left arm numbness and tingling and jaw pain.  Abnormal glucose Recent A1Cs well controlled Discussed disease and risks Discussed diet/exercise, weight management  -     CMP WITH GFR  Medication management -     CBC with Differential/Platelet -     CMP/GFR  Mixed hyperlipidemia At goal with zetia and low dose pravastatin (hx of intolerance with higher doses) Continue to encourage low cholesterol diet, exercise, weight management -     Lipid panel -     TSH  Vitamin D deficiency Continue supplementation for goal of 60-100 Check vitamin D level biannually - has not increased dose, defer today ***  CKD (chronic kidney disease), symptom management only, stage 2 (mild) Increase fluids, avoid NSAIDS, monitor sugars, will monitor -     CMP WITH GFR  BMI 24.0-24.9, adult At goal Recommended diet heavy in fruits and veggies and low in animal meats, cheeses, and dairy products, appropriate calorie intake Discussed exercise recommendations Continue to monitor at each visit  OAB ***  Estrogen deficiency -     DG Bone Density; Future ***  Smoker Intermittently smoking for an estimated 45 pack years. -lung cancer screening with low dose CT discussed as recommended by guidelines based on age, number of pack year history.  Discussed risks of screening including but not limited to false positives on xray, further testing or consultation with specialist, and possible false negative CT as well. Patient would like to consider and defer at this time. Will revisit periodically.   Need for mammogram Information provided for  patient to schedule ***  Anxiety/dysthymia Well controlled on zoloft 100 mg daily Stress management techniques discussed, increase water, good sleep hygiene discussed, increase exercise, and increase veggies.    Over 40 minutes of exam, counseling, chart review and critical decision making was performed Future Appointments  Date Time Provider [place]  01/09/2019  4:15 PM Liane Comber, NP [facility] None  04/15/2019  4:30 PM Unk Pinto, MD [facility] None  10/15/2019  3:00 PM Unk Pinto, MD [facility] None  10/29/2019  2:00 PM Unk Pinto, MD [facility] None     Plan:   During the course of the visit the patient was educated and counseled about appropriate screening and preventive services including:    Pneumococcal vaccine   Prevnar 13  Influenza vaccine  Td vaccine  Screening electrocardiogram  Bone densitometry screening  Colorectal cancer screening  Diabetes screening  Glaucoma screening  Nutrition counseling   Advanced directives: requested   Subjective:  Brittany Hanna is a 68 y.o. female who presents for Medicare Annual Wellness Visit and 3 month follow up.   she currently continues to smoke ~1 pack a day; discussed risks associated with smoking, patient is not ready to quit.  She has *** pack/year history; low dose CT screening has been discussed but declined; ***  Last CXR in 2010 with COPD changes ***   She has OAB on oxybutynin ***  She has hx of depression/anxiety well controlled on zoloft 100 mg daily.   she has a diagnosis of GERD which is currently managed by omeprazole *** she reports symptoms {ACTION; ARE/ARE  OAC:[id number]} currently well controlled, and denies breakthrough reflux, burning in chest, hoarseness or cough.    BMI is There is no height or weight on file to calculate BMI., she has been working on diet and exercise. Avoids meat, focuses on vegetables and fruit.  Wt Readings from Last 3 Encounters:   10/04/18 175 lb 6.4 oz (79.6 kg)  06/12/18 174 lb (78.9 kg)  01/24/18 171 lb 3.2 oz (77.7 kg)   She does not check her BP at home, today their BP is   She does workout. She denies chest pain, shortness of breath, dizziness.  She had negative cardiolite in 2010.   She is on cholesterol medication (zetia 10 mg daily, pravastatin 20 mg MWF) and denies myalgias. Severe reaction to higher dose statin in the past. Her LDL cholesterol is at goal, trigs mildly elevated. The cholesterol last visit was:   Lab Results  Component Value Date   CHOL 170 10/04/2018   HDL 60 10/04/2018   LDLCALC 84 10/04/2018   TRIG 156 (H) 10/04/2018   CHOLHDL 2.8 10/04/2018    She has been working on diet and exercise for glucose management, and denies increased appetite, nausea, paresthesia of the feet, polydipsia, polyuria, visual disturbances, vomiting and weight loss. Last A1C in the office was:  Lab Results  Component Value Date   HGBA1C 5.0 10/04/2018    Last GFR: Lab Results  Component Value Date   GFRNONAA 68 10/04/2018   Patient is on Vitamin D supplement but remains well below goal of 60:   Lab Results  Component Value Date   VD25OH 40 10/04/2018      Medication Review: Current Outpatient Medications on File Prior to Visit  Medication Sig Dispense Refill  . aspirin EC 81 MG tablet Take 81 mg by mouth daily.    Marland Kitchen CALCIUM PO Take 1 tablet by mouth 3 (three) times a week. No specific days.    . Cholecalciferol (VITAMIN D PO) Take 5,000 Units by mouth daily. No specific days.    Marland Kitchen ezetimibe (ZETIA) 10 MG tablet Take 1 tablet daily for Cholesterol 90 tablet 3  . hyoscyamine (LEVSIN SL) 0.125 MG SL tablet Place 1 tablet (0.125 mg total) every 4 (four) hours as needed under the tongue. 30 tablet 1  . omeprazole (PRILOSEC) 40 MG capsule Take 40 mg by mouth daily.     Marland Kitchen oxybutynin (DITROPAN) 5 MG tablet Take 1/2 to 1 tablet 2 to 3 times a day as needed for bladder. 360 tablet 3  . pravastatin  (PRAVACHOL) 40 MG tablet Take 1/2 tablet by mouth on M/W/F 30 tablet 0  . sertraline (ZOLOFT) 100 MG tablet Take1 tablet  Daily for Mood 90 tablet 3   No current facility-administered medications on file prior to visit.     Allergies  Allergen Reactions  . Lipitor [Atorvastatin] Other (See Comments)    Fatigue.    Current Problems (verified) Patient Active Problem List   Diagnosis Date Noted  . FHx: heart disease 10/07/2018  . OAB (overactive bladder) 10/07/2018  . Dysthymia 10/07/2018  . Gastroesophageal reflux disease 10/07/2018  . Aortic atherosclerosis ([place]) 10/30/2017  . Smoker 10/24/2017  . Anxiety 10/24/2017  . CKD (chronic kidney disease) stage 2, GFR 60-89 ml/min 10/23/2017  . BMI 25.0-25.9,adult 10/23/2017  . Hyperlipidemia, mixed 08/08/2013  . Essential hypertension 08/08/2013  . Vitamin D deficiency 08/08/2013  . Abnormal glucose 08/08/2013    Screening Tests Immunization History  Administered Date(s) Administered  . PPD Test  03/19/2014, 03/24/2015, 06/01/2016  . Pneumococcal Conjugate-13 07/24/2017  . Pneumococcal Polysaccharide-23 06/01/2016  . Tdap 01/24/2013  . Zoster 11/03/2013    Preventative care: Last colonoscopy: 2011 due 2021 Last mammogram: 2012, *** will give information to follow up  Last pap smear/pelvic exam: remote - typically goes to GYN, plans to follow up DEXA: ***  Prior vaccinations: TD or Tdap: 2014  Influenza: 2018 *** Pneumococcal: 2017 Prevnar13: 2018 Shingles/Zostavax: 2015  Names of Other Physician/Practitioners you currently use: 1. [facility] here for primary care 2. ?, eye doctor, last visit 2018 3. [place], dentist, last visit 2019  Patient Care Team: Unk Pinto, MD as PCP - General (Internal Medicine)  SURGICAL HISTORY She  has a past surgical history that includes Tubal ligation. FAMILY HISTORY Her family history includes COPD in her father; Heart failure in her  mother; Hypertension in her father; Osteoarthritis in her father. SOCIAL HISTORY She  reports that she has been smoking cigarettes. She has a 45.00 pack-year smoking history. She has never used smokeless tobacco. She reports current alcohol use. She reports that she does not use drugs.   MEDICARE WELLNESS OBJECTIVES: Physical activity:   Cardiac risk factors:   Depression/mood screen:   Depression screen [facility] 2/9 10/04/2018  Decreased Interest 0  Down, Depressed, Hopeless 0  PHQ - 2 Score 0    ADLs:  In your present state of health, do you have any difficulty performing the following activities: 10/04/2018 01/25/2018  Hearing? N N  Vision? N N  Difficulty concentrating or making decisions? N N  Walking or climbing stairs? N N  Dressing or bathing? N N  Doing errands, shopping? N N  Some recent data might be hidden     Cognitive Testing  Alert? Yes  Normal Appearance?Yes  Oriented to person? Yes  Place? Yes   Time? Yes  Recall of three objects?  Yes  Can perform simple calculations? Yes  Displays appropriate judgment?Yes  Can read the correct time from a watch face?Yes  EOL planning:    Review of Systems  Constitutional: Negative for malaise/fatigue and weight loss.  HENT: Negative for hearing loss and tinnitus.   Eyes: Negative for blurred vision and double vision.  Respiratory: Negative for cough, shortness of breath and wheezing.   Cardiovascular: Negative for chest pain, palpitations, orthopnea, claudication and leg swelling.  Gastrointestinal: Negative for abdominal pain, blood in stool, constipation, diarrhea, heartburn, melena, nausea and vomiting.  Genitourinary: Negative.   Musculoskeletal: Negative for joint pain and myalgias.  Skin: Negative for rash.  Neurological: Negative for dizziness, tingling, sensory change, weakness and headaches.  Endo/Heme/Allergies: Negative for polydipsia.  Psychiatric/Behavioral: Negative.   All other systems reviewed and are  negative.    Objective:     There were no vitals filed for this visit. There is no height or weight on file to calculate BMI.  General appearance: alert, no distress, WD/WN, female HEENT: normocephalic, sclerae anicteric, TMs pearly, nares patent, no discharge or erythema, pharynx normal Oral cavity: MMM, no lesions Neck: supple, no lymphadenopathy, no thyromegaly, no masses Heart: RRR, normal S1, S2, no murmurs Lungs: CTA bilaterally, no wheezes, rhonchi, or rales Abdomen: +bs, soft, non tender, non distended, no masses, no hepatomegaly, no splenomegaly Musculoskeletal: nontender, no swelling, no obvious deformity Extremities: no edema, no cyanosis, no clubbing Pulses: 2+ symmetric, upper and lower extremities, normal cap refill Neurological: alert, oriented x 3, CN2-12 intact, strength normal upper extremities and lower extremities, sensation normal throughout, DTRs  2+ throughout, no cerebellar signs, gait normal Psychiatric: normal affect, behavior normal, pleasant    Medicare Attestation I have personally reviewed: The patient's medical and social history Their use of alcohol, tobacco or illicit drugs Their current medications and supplements The patient's functional ability including ADLs,fall risks, home safety risks, cognitive, and hearing and visual impairment Diet and physical activities Evidence for depression or mood disorders  The patient's weight, height, BMI, and visual acuity have been recorded in the chart.  I have made referrals, counseling, and provided education to the patient based on review of the above and I have provided the patient with a written personalized care plan for preventive services.     Brittany Ribas, NP   01/07/2019

## 2019-01-09 ENCOUNTER — Ambulatory Visit: Payer: Self-pay | Admitting: Adult Health

## 2019-01-18 ENCOUNTER — Encounter: Payer: Self-pay | Admitting: Adult Health

## 2019-01-18 NOTE — Progress Notes (Signed)
ANNUAL MEDICARE VISIT AND FOLLOW UP  Assessment:   Diagnoses and all orders for this visit:  Encounter for Annual Medicare Visit  Essential hypertension At goal; continue current plan Monitor blood pressure at home; call if consistently over 130/80 Continue DASH diet.   Reminder to go to the ER if any CP, SOB, nausea, dizziness, severe HA, changes vision/speech, left arm numbness and tingling and jaw pain.  Abnormal glucose Recent A1Cs well controlled Discussed disease and risks Discussed diet/exercise, weight management  -     CMP WITH GFR  Medication management -     CBC with Differential/Platelet -     CMP/GFR  Mixed hyperlipidemia At goal with zetia and low dose pravastatin (hx of intolerance with higher doses) Continue to encourage low cholesterol diet, exercise, weight management -     Lipid panel -     TSH  Vitamin D deficiency Continue supplementation for goal of 60-100 Check vitamin D level biannually - has increased dose but hasn't taken this past week due to moving   CKD (chronic kidney disease), symptom management only, stage 2 (mild) Increase fluids, avoid NSAIDS, monitor sugars, will monitor -     CMP WITH GFR  BMI 24.0-24.9, adult At goal Recommended diet heavy in fruits and veggies and low in animal meats, cheeses, and dairy products, appropriate calorie intake Discussed exercise recommendations Continue to monitor at each visit  OAB Takes oxybutynin PRN and doing well   Estrogen deficiency -     DG Bone Density; Future  Smoker Intermittently smoking for an estimated 40 pack years. -lung cancer screening with low dose CT discussed as recommended by guidelines based on age, number of pack year history.  Discussed risks of screening including but not limited to false positives on xray, further testing or consultation with specialist, and possible false negative CT as well. Patient would like to proceed with this and order placed.   Need for  mammogram Ordered to be scheduled through our office due to poor patient follow through   Anxiety/dysthymia Well controlled on zoloft 50 mg daily Stress management techniques discussed, increase water, good sleep hygiene discussed, increase exercise, and increase veggies.    Over 40 minutes of exam, counseling, chart review and critical decision making was performed Future Appointments  Date Time Provider [place]  04/15/2019  4:30 PM Unk Pinto, MD [facility] None  10/29/2019  2:00 PM Unk Pinto, MD [facility] None     Plan:   During the course of the visit the patient was educated and counseled about appropriate screening and preventive services including:    Pneumococcal vaccine   Prevnar 13  Influenza vaccine  Td vaccine  Screening electrocardiogram  Bone densitometry screening  Colorectal cancer screening  Diabetes screening  Glaucoma screening  Nutrition counseling   Advanced directives: requested   Subjective:  Brittany Hanna is a 68 y.o. female who presents for Medicare Annual Wellness Visit and 3 month follow up.   she currently continues to smoke ~1 pack a day; discussed risks associated with smoking, patient is not ready to quit.  She has 40 pack/year history; low dose CT screening has been discussed and she is agreeable   Last CXR in 2010 with COPD changes.  She has OAB on oxybutynin PRN and does well taking on as needed, typically takes if she will be out in public for extended perids.    She has hx of depression/anxiety well controlled on zoloft 50 mg daily.   she has  a diagnosis of GERD which is currently managed by omeprazole 40 mg PRN, takes rarely.  she reports symptoms is currently well controlled, and denies breakthrough reflux, burning in chest, hoarseness or cough.    BMI is Body mass index is 23.96 kg/m., she has been working on diet and exercise. Avoids meat, focuses on vegetables and fruit.  Wt Readings from Last  3 Encounters:  01/21/19 167 lb (75.8 kg)  10/04/18 175 lb 6.4 oz (79.6 kg)  06/12/18 174 lb (78.9 kg)   She does not check her BP at home, today their BP is BP: 124/68 She does workout. She denies chest pain, shortness of breath, dizziness.  She had negative cardiolite in 2010.   She is on cholesterol medication (zetia 10 mg daily) and denies myalgias. Severe reaction to higher dose statin in the past. Her LDL cholesterol is at goal, trigs mildly elevated. The cholesterol last visit was:   Lab Results  Component Value Date   CHOL 170 10/04/2018   HDL 60 10/04/2018   LDLCALC 84 10/04/2018   TRIG 156 (H) 10/04/2018   CHOLHDL 2.8 10/04/2018    She has been working on diet and exercise for glucose management, and denies increased appetite, nausea, paresthesia of the feet, polydipsia, polyuria, visual disturbances, vomiting and weight loss. Last A1C in the office was:  Lab Results  Component Value Date   HGBA1C 5.0 10/04/2018    Last GFR: Lab Results  Component Value Date   GFRNONAA 68 10/04/2018   Patient is on Vitamin D supplement but remains well below goal of 60:   Lab Results  Component Value Date   VD25OH 40 10/04/2018      Medication Review: Current Outpatient Medications on File Prior to Visit  Medication Sig Dispense Refill  . aspirin EC 81 MG tablet Take 81 mg by mouth daily.    Marland Kitchen CALCIUM PO Take 1 tablet by mouth 3 (three) times a week. No specific days.    . Cholecalciferol (VITAMIN D PO) Take 5,000 Units by mouth daily. No specific days.    Marland Kitchen ezetimibe (ZETIA) 10 MG tablet Take 1 tablet daily for Cholesterol 90 tablet 3  . sertraline (ZOLOFT) 100 MG tablet Take1 tablet  Daily for Mood 90 tablet 3  . hyoscyamine (LEVSIN SL) 0.125 MG SL tablet Place 1 tablet (0.125 mg total) every 4 (four) hours as needed under the tongue. (Patient not taking: Reported on 01/21/2019) 30 tablet 1  . omeprazole (PRILOSEC) 40 MG capsule Take 40 mg by mouth daily.     Marland Kitchen oxybutynin  (DITROPAN) 5 MG tablet Take 1/2 to 1 tablet 2 to 3 times a day as needed for bladder. (Patient not taking: Reported on 01/21/2019) 360 tablet 3   No current facility-administered medications on file prior to visit.     Allergies  Allergen Reactions  . Lipitor [Atorvastatin] Other (See Comments)    Fatigue.    Current Problems (verified) Patient Active Problem List   Diagnosis Date Noted  . COPD (chronic obstructive pulmonary disease) ([place]) 01/07/2019  . OAB (overactive bladder) 10/07/2018  . Dysthymia 10/07/2018  . Gastroesophageal reflux disease 10/07/2018  . Aortic atherosclerosis ([place]) 10/30/2017  . Smoker 10/24/2017  . Anxiety 10/24/2017  . CKD (chronic kidney disease) stage 2, GFR 60-89 ml/min 10/23/2017  . BMI 25.0-25.9,adult 10/23/2017  . Hyperlipidemia, mixed 08/08/2013  . Essential hypertension 08/08/2013  . Vitamin D deficiency 08/08/2013  . Abnormal glucose 08/08/2013    Screening Tests Immunization History  Administered  Date(s) Administered  . PPD Test 03/19/2014, 03/24/2015, 06/01/2016  . Pneumococcal Conjugate-13 07/24/2017  . Pneumococcal Polysaccharide-23 06/01/2016  . Tdap 01/24/2013  . Zoster 11/03/2013    Preventative care: Last colonoscopy: 2011 due 2021 Last mammogram: 2012, schedule due to poor follow through Last pap smear/pelvic exam: remote - typically goes to GYN, plans to follow up DEXA: will schedule  Prior vaccinations: TD or Tdap: 2014  Influenza: 2019 at work Pneumococcal: 2017 Prevnar13: 2018 Shingles/Zostavax: 2015  Names of Other Physician/Practitioners you currently use: 1. [facility] here for primary care 2. Dr. Jefm Bryant, eye doctor, last visit 2019 3. [place], dentist, last visit 2019, goes q75m  Patient Care Team: Unk Pinto, MD as PCP - General (Internal Medicine)  SURGICAL HISTORY She  has a past surgical history that includes Tubal ligation and Cataract extraction,  bilateral (Bilateral, 2019). FAMILY HISTORY Her family history includes COPD in her father; Heart failure in her mother; Hypertension in her father; Osteoarthritis in her father. SOCIAL HISTORY She  reports that she has been smoking cigarettes. She has a 40.00 pack-year smoking history. She has never used smokeless tobacco. She reports current alcohol use. She reports that she does not use drugs.   MEDICARE WELLNESS OBJECTIVES: Physical activity: Current Exercise Habits: Home exercise routine, Type of exercise: walking, Time (Minutes): 30, Frequency (Times/Week): 7, Weekly Exercise (Minutes/Week): 210, Intensity: Mild, Exercise limited by: None identified Cardiac risk factors: Cardiac Risk Factors include: dyslipidemia;advanced age (>62men, >68 women);family history of premature cardiovascular disease;smoking/ tobacco exposure Depression/mood screen:   Depression screen [facility] 2/9 01/21/2019  Decreased Interest 0  Down, Depressed, Hopeless 0  PHQ - 2 Score 0    ADLs:  In your present state of health, do you have any difficulty performing the following activities: 01/21/2019 10/04/2018  Hearing? Y N  Comment has bilateral hearing aids -  Vision? N N  Difficulty concentrating or making decisions? N N  Walking or climbing stairs? N N  Dressing or bathing? N N  Doing errands, shopping? N N  Some recent data might be hidden     Cognitive Testing  Alert? Yes  Normal Appearance?Yes  Oriented to person? Yes  Place? Yes   Time? Yes  Recall of three objects?  Yes  Can perform simple calculations? Yes  Displays appropriate judgment?Yes  Can read the correct time from a watch face?Yes  EOL planning: Does Patient Have a Medical Advance Directive?: No Would patient like information on creating a medical advance directive?: No - Patient declined  Review of Systems  Constitutional: Negative for malaise/fatigue and weight loss.  HENT: Negative for hearing loss and tinnitus.   Eyes: Negative for  blurred vision and double vision.  Respiratory: Negative for cough, shortness of breath and wheezing.   Cardiovascular: Negative for chest pain, palpitations, orthopnea, claudication and leg swelling.  Gastrointestinal: Negative for abdominal pain, blood in stool, constipation, diarrhea, heartburn, melena, nausea and vomiting.  Genitourinary: Negative.   Musculoskeletal: Negative for joint pain and myalgias.  Skin: Negative for rash.  Neurological: Negative for dizziness, tingling, sensory change, weakness and headaches.  Endo/Heme/Allergies: Negative for polydipsia.  Psychiatric/Behavioral: Negative.   All other systems reviewed and are negative.    Objective:     Today's Vitals   01/21/19 1515  BP: 124/68  Pulse: 71  Temp: (!) 96.6 F (35.9 C)  SpO2: 98%  Weight: 167 lb (75.8 kg)   Body mass index is 23.96 kg/m.  General appearance: alert, no distress, WD/WN,  female HEENT: normocephalic, sclerae anicteric, TMs pearly, nares patent, no discharge or erythema, pharynx normal Oral cavity: MMM, no lesions Neck: supple, no lymphadenopathy, no thyromegaly, no masses Heart: RRR, normal S1, S2, no murmurs Lungs: CTA bilaterally, no wheezes, rhonchi, or rales Abdomen: +bs, soft, non tender, non distended, no masses, no hepatomegaly, no splenomegaly Musculoskeletal: nontender, no swelling, no obvious deformity Extremities: no edema, no cyanosis, no clubbing Pulses: 2+ symmetric, upper and lower extremities, normal cap refill Neurological: alert, oriented x 3, CN2-12 intact, strength normal upper extremities and lower extremities, sensation normal throughout, DTRs 2+ throughout, no cerebellar signs, gait normal Psychiatric: normal affect, behavior normal, pleasant    Medicare Attestation I have personally reviewed: The patient's medical and social history Their use of alcohol, tobacco or illicit drugs Their current medications and supplements The patient's functional ability  including ADLs,fall risks, home safety risks, cognitive, and hearing and visual impairment Diet and physical activities Evidence for depression or mood disorders  The patient's weight, height, BMI, and visual acuity have been recorded in the chart.  I have made referrals, counseling, and provided education to the patient based on review of the above and I have provided the patient with a written personalized care plan for preventive services.     Izora Ribas, NP   01/21/2019

## 2019-01-21 ENCOUNTER — Encounter: Payer: Self-pay | Admitting: Adult Health

## 2019-01-21 ENCOUNTER — Ambulatory Visit (INDEPENDENT_AMBULATORY_CARE_PROVIDER_SITE_OTHER): Payer: Medicare Other | Admitting: Adult Health

## 2019-01-21 ENCOUNTER — Other Ambulatory Visit: Payer: Self-pay

## 2019-01-21 VITALS — BP 124/68 | HR 71 | Temp 96.6°F | Wt 167.0 lb

## 2019-01-21 DIAGNOSIS — F341 Dysthymic disorder: Secondary | ICD-10-CM

## 2019-01-21 DIAGNOSIS — Z0000 Encounter for general adult medical examination without abnormal findings: Secondary | ICD-10-CM

## 2019-01-21 DIAGNOSIS — Z122 Encounter for screening for malignant neoplasm of respiratory organs: Secondary | ICD-10-CM

## 2019-01-21 DIAGNOSIS — Z0001 Encounter for general adult medical examination with abnormal findings: Secondary | ICD-10-CM | POA: Diagnosis not present

## 2019-01-21 DIAGNOSIS — I10 Essential (primary) hypertension: Secondary | ICD-10-CM

## 2019-01-21 DIAGNOSIS — K219 Gastro-esophageal reflux disease without esophagitis: Secondary | ICD-10-CM | POA: Diagnosis not present

## 2019-01-21 DIAGNOSIS — F419 Anxiety disorder, unspecified: Secondary | ICD-10-CM

## 2019-01-21 DIAGNOSIS — Z1239 Encounter for other screening for malignant neoplasm of breast: Secondary | ICD-10-CM

## 2019-01-21 DIAGNOSIS — J449 Chronic obstructive pulmonary disease, unspecified: Secondary | ICD-10-CM | POA: Diagnosis not present

## 2019-01-21 DIAGNOSIS — E2839 Other primary ovarian failure: Secondary | ICD-10-CM

## 2019-01-21 DIAGNOSIS — R6889 Other general symptoms and signs: Secondary | ICD-10-CM

## 2019-01-21 DIAGNOSIS — R7309 Other abnormal glucose: Secondary | ICD-10-CM | POA: Diagnosis not present

## 2019-01-21 DIAGNOSIS — E782 Mixed hyperlipidemia: Secondary | ICD-10-CM

## 2019-01-21 DIAGNOSIS — I700 Atherosclerosis of aorta: Secondary | ICD-10-CM

## 2019-01-21 DIAGNOSIS — F17200 Nicotine dependence, unspecified, uncomplicated: Secondary | ICD-10-CM | POA: Diagnosis not present

## 2019-01-21 DIAGNOSIS — E559 Vitamin D deficiency, unspecified: Secondary | ICD-10-CM | POA: Diagnosis not present

## 2019-01-21 DIAGNOSIS — N3281 Overactive bladder: Secondary | ICD-10-CM | POA: Diagnosis not present

## 2019-01-21 DIAGNOSIS — N182 Chronic kidney disease, stage 2 (mild): Secondary | ICD-10-CM | POA: Diagnosis not present

## 2019-01-21 DIAGNOSIS — Z6825 Body mass index (BMI) 25.0-25.9, adult: Secondary | ICD-10-CM

## 2019-01-21 NOTE — Patient Instructions (Addendum)
  Brittany Hanna , Thank you for taking time to come for your Medicare Wellness Visit. I appreciate your ongoing commitment to your health goals. Please review the following plan we discussed and let me know if I can assist you in the future.   These are the goals we discussed: Goals    . LDL CALC < 100    . Quit Smoking       This is a list of the screening recommended for you and due dates:  Health Maintenance  Topic Date Due  . Mammogram  01/25/2013  . DEXA scan (bone density measurement)  07/07/2016  . Flu Shot  04/06/2019  . Colon Cancer Screening  03/30/2020  . Pneumonia vaccines (2 of 2 - PPSV23) 06/01/2021  . Tetanus Vaccine  01/25/2023  .  Hepatitis C: One time screening is recommended by Center for Disease Control  (CDC) for  adults born from [birth year] through 1965.   Completed

## 2019-01-22 LAB — COMPLETE METABOLIC PANEL WITH GFR
AG Ratio: 1.8 (calc) (ref 1.0–2.5)
ALT: 13 U/L (ref 6–29)
AST: 18 U/L (ref 10–35)
Albumin: 4.1 g/dL (ref 3.6–5.1)
Alkaline phosphatase (APISO): 44 U/L (ref 37–153)
BUN: 12 mg/dL (ref 7–25)
CO2: 28 mmol/L (ref 20–32)
Calcium: 9.5 mg/dL (ref 8.6–10.4)
Chloride: 107 mmol/L (ref 98–110)
Creat: 0.78 mg/dL (ref 0.50–0.99)
GFR, Est African American: 91 mL/min/{1.73_m2} (ref >=60)
GFR, Est Non African American: 79 mL/min/{1.73_m2} (ref >=60)
Globulin: 2.3 g/dL (calc) (ref 1.9–3.7)
Glucose, Bld: 76 mg/dL (ref 65–99)
Potassium: 4 mmol/L (ref 3.5–5.3)
Sodium: 140 mmol/L (ref 135–146)
Total Bilirubin: 0.5 mg/dL (ref 0.2–1.2)
Total Protein: 6.4 g/dL (ref 6.1–8.1)

## 2019-01-22 LAB — CBC WITH DIFFERENTIAL/PLATELET
Absolute Monocytes: 611 cells/uL (ref 200–950)
Basophils Absolute: 28 cells/uL (ref 0–200)
Basophils Relative: 0.5 %
Eosinophils Absolute: 99 cells/uL (ref 15–500)
Eosinophils Relative: 1.8 %
HCT: 41.3 % (ref 35.0–45.0)
Hemoglobin: 14.5 g/dL (ref 11.7–15.5)
Lymphs Abs: 1793 cells/uL (ref 850–3900)
MCH: 34.4 pg — ABNORMAL HIGH (ref 27.0–33.0)
MCHC: 35.1 g/dL (ref 32.0–36.0)
MCV: 98.1 fL (ref 80.0–100.0)
MPV: 10.4 fL (ref 7.5–12.5)
Monocytes Relative: 11.1 %
Neutro Abs: 2970 cells/uL (ref 1500–7800)
Neutrophils Relative %: 54 %
Platelets: 223 10*3/uL (ref 140–400)
RBC: 4.21 10*6/uL (ref 3.80–5.10)
RDW: 12.3 % (ref 11.0–15.0)
Total Lymphocyte: 32.6 %
WBC: 5.5 10*3/uL (ref 3.8–10.8)

## 2019-01-22 LAB — LIPID PANEL
Cholesterol: 188 mg/dL (ref <200)
HDL: 65 mg/dL (ref >=50)
LDL Cholesterol (Calc): 109 mg/dL (calc) — ABNORMAL HIGH
Non-HDL Cholesterol (Calc): 123 mg/dL (calc) (ref <130)
Total CHOL/HDL Ratio: 2.9 (calc) (ref <5.0)
Triglycerides: 57 mg/dL (ref <150)

## 2019-01-22 LAB — MAGNESIUM: Magnesium: 2 mg/dL (ref 1.5–2.5)

## 2019-01-22 LAB — TSH: TSH: 0.43 mIU/L (ref 0.40–4.50)

## 2019-04-15 ENCOUNTER — Ambulatory Visit: Payer: Self-pay | Admitting: Internal Medicine

## 2019-04-25 ENCOUNTER — Ambulatory Visit: Payer: Self-pay | Admitting: Internal Medicine

## 2019-04-25 ENCOUNTER — Ambulatory Visit: Payer: Medicare Other

## 2019-05-12 DIAGNOSIS — N3001 Acute cystitis with hematuria: Secondary | ICD-10-CM | POA: Diagnosis not present

## 2019-05-12 DIAGNOSIS — R300 Dysuria: Secondary | ICD-10-CM | POA: Diagnosis not present

## 2019-10-15 ENCOUNTER — Encounter: Payer: Self-pay | Admitting: Internal Medicine

## 2019-10-28 ENCOUNTER — Encounter: Payer: Self-pay | Admitting: Internal Medicine

## 2019-10-28 NOTE — Progress Notes (Addendum)
NO SHOW

## 2019-10-29 ENCOUNTER — Ambulatory Visit: Payer: Self-pay | Admitting: Internal Medicine

## 2019-12-17 ENCOUNTER — Encounter: Payer: Medicare Other | Admitting: Internal Medicine

## 2020-01-07 ENCOUNTER — Encounter: Payer: Self-pay | Admitting: Internal Medicine

## 2020-01-07 NOTE — Progress Notes (Signed)
Annual Screening/Preventative Visit & Comprehensive Evaluation &  Examination     This very nice 69 y.o. WWF  presents for a Screening /Preventative Visit & comprehensive evaluation and management of multiple medical co-morbidities.  Patient has been followed for HTN, HLD, Prediabetes  and Vitamin D Deficiency.  Last year patient was ordered a LD screening Chest CT for her hx/o smoking, but never followed through.       Patient has been followed expectantly since 2005 for labile HTN. Patient had a negative Cardiolite in 2015.  Patient's BP has been controlled at home and patient denies any cardiac symptoms as chest pain, palpitations, shortness of breath, dizziness or ankle swelling. Today's BP is at goal -  126/82.      Patient has hx/o Intolerance to Atorvastatin and is on Zetia and her Hyperlipidemia is not controlled.  Patient denies myalgias or other medication SE's. Last lipids were not at goal:  Lab Results  Component Value Date   CHOL 188 01/21/2019   HDL 65 01/21/2019   LDLCALC 109 (H) 01/21/2019   TRIG 57 01/21/2019   CHOLHDL 2.9 01/21/2019       Patient has hx/o prediabetes predating since      and patient denies reactive hypoglycemic symptoms, visual blurring, diabetic polys or paresthesias. Last A1c was Normal & at goal:  Lab Results  Component Value Date   HGBA1C 5.0 10/04/2018       Finally, patient has history of Vitamin D Deficiency ("10" / 2008)  and last Vitamin D was not at goal:  Lab Results  Component Value Date   VD25OH 40 10/04/2018    Current Outpatient Medications on File Prior to Visit  Medication Sig  . aspirin EC 81 MG tablet Take 81 mg by mouth daily.  Marland Kitchen CALCIUM PO Take 1 tablet by mouth 3 (three) times a week. No specific days.  . Cholecalciferol (VITAMIN D PO) Take 5,000 Units by mouth in the morning and at bedtime.   Marland Kitchen ezetimibe (ZETIA) 10 MG tablet Take 1 tablet daily for Cholesterol  . hyoscyamine (LEVSIN SL) 0.125 MG SL tablet Place 1 tablet  (0.125 mg total) every 4 (four) hours as needed under the tongue.  Marland Kitchen omeprazole (PRILOSEC) 40 MG capsule Take 40 mg by mouth daily.   Marland Kitchen oxybutynin (DITROPAN) 5 MG tablet Take 1/2 to 1 tablet 2 to 3 times a day as needed for bladder.  . sertraline (ZOLOFT) 100 MG tablet Take1 tablet  Daily for Mood   No current facility-administered medications on file prior to visit.   Allergies  Allergen Reactions  . Lipitor [Atorvastatin] Other (See Comments)    Fatigue.   Past Medical History:  Diagnosis Date  . B12 deficiency   . FHx: heart disease 10/07/2018  . High cholesterol   . Hypertension   . IBS (irritable bowel syndrome)   . Vitamin D deficiency    Health Maintenance  Topic Date Due  . COVID-19 Vaccine (1) Never done  . MAMMOGRAM  01/25/2013  . DEXA SCAN  Never done  . COLONOSCOPY  03/30/2020  . INFLUENZA VACCINE  04/05/2020  . PNA vac Low Risk Adult (2 of 2 - PPSV23) 06/01/2021  . TETANUS/TDAP  01/25/2023  . Hepatitis C Screening  Completed   Immunization History  Administered Date(s) Administered  . Influenza, High Dose Seasonal PF 06/29/2018, 06/28/2019  . PPD Test 03/19/2014, 03/24/2015, 06/01/2016  . Pneumococcal Conjugate-13 07/24/2017  . Pneumococcal Polysaccharide-23 06/01/2016  . Tdap 01/24/2013  . Zoster  11/03/2013    Last Colon - 03/30/2010 - Dr Fuller Plan - recc 10 yr f/u Colon  Last MGM - 02/01/2011 - Patient aware overdue  Past Surgical History:  Procedure Laterality Date  . CATARACT EXTRACTION, BILATERAL Bilateral 2019   Dr. Tommy Rainwater   . TUBAL LIGATION     Family History  Problem Relation Age of Onset  . Heart failure Mother   . Osteoarthritis Father   . Hypertension Father   . COPD Father    Social History   Tobacco Use  . Smoking status: Current Some Day Smoker    Packs/day: 1.00    Years: 40.00    Pack years: 40.00    Types: Cigarettes  . Smokeless tobacco: Never Used  Substance Use Topics  . Alcohol use: Yes    Comment: occasionally  . Drug  use: No    ROS Constitutional: Denies fever, chills, weight loss/gain, headaches, insomnia,  night sweats, and change in appetite. Does c/o fatigue. Eyes: Denies redness, blurred vision, diplopia, discharge, itchy, watery eyes.  ENT: Denies discharge, congestion, post nasal drip, epistaxis, sore throat, earache, hearing loss, dental pain, Tinnitus, Vertigo, Sinus pain, snoring.  Cardio: Denies chest pain, palpitations, irregular heartbeat, syncope, dyspnea, diaphoresis, orthopnea, PND, claudication, edema Respiratory: denies cough, dyspnea, DOE, pleurisy, hoarseness, laryngitis, wheezing.  Gastrointestinal: Denies dysphagia, heartburn, reflux, water brash, pain, cramps, nausea, vomiting, bloating, diarrhea, constipation, hematemesis, melena, hematochezia, jaundice, hemorrhoids Genitourinary: Denies dysuria, frequency, urgency, nocturia, hesitancy, discharge, hematuria, flank pain Breast: Breast lumps, nipple discharge, bleeding.  Musculoskeletal: Denies arthralgia, myalgia, stiffness, Jt. Swelling, pain, limp, and strain/sprain. Denies falls. Skin: Denies puritis, rash, hives, warts, acne, eczema, changing in skin lesion Neuro: No weakness, tremor, incoordination, spasms, paresthesia, pain Psychiatric: Denies confusion, memory loss, sensory loss. Denies Depression. Endocrine: Denies change in weight, skin, hair change, nocturia, and paresthesia, diabetic polys, visual blurring, hyper / hypo glycemic episodes.  Heme/Lymph: No excessive bleeding, bruising, enlarged lymph nodes.  Physical Exam  BP 126/82   Pulse 84   Temp 97.8 F (36.6 C)   Resp 16   Ht 5\' 10"  (1.778 m)   Wt 181 lb 9.6 oz (82.4 kg)   BMI 26.06 kg/m   General Appearance: Well nourished, well groomed and in no apparent distress.  Eyes: PERRLA, EOMs, conjunctiva no swelling or erythema, normal fundi and vessels. Sinuses: No frontal/maxillary tenderness ENT/Mouth: EACs patent / TMs  nl. Nares clear without erythema,  swelling, mucoid exudates. Oral hygiene is good. No erythema, swelling, or exudate. Tongue normal, non-obstructing. Tonsils not swollen or erythematous. Hearing normal.  Neck: Supple, thyroid not palpable. No bruits, nodes or JVD. Respiratory: Respiratory effort normal.  BS equal and clear bilateral without rales, rhonci, wheezing or stridor. Cardio: Heart sounds are normal with regular rate and rhythm and no murmurs, rubs or gallops. Peripheral pulses are normal and equal bilaterally without edema. No aortic or femoral bruits. Chest: symmetric with normal excursions and percussion. Breasts: Symmetric, without lumps, nipple discharge, retractions, or fibrocystic changes.  Abdomen: Flat, soft with bowel sounds active. Nontender, no guarding, rebound, hernias, masses, or organomegaly.  Lymphatics: Non tender without lymphadenopathy.  Musculoskeletal: Full ROM all peripheral extremities, joint stability, 5/5 strength, and normal gait. Skin: Warm and dry without rashes, lesions, cyanosis, clubbing or  ecchymosis.  Neuro: Cranial nerves intact, reflexes equal bilaterally. Normal muscle tone, no cerebellar symptoms. Sensation intact.  Pysch: Alert and oriented X 3, normal affect, Insight and Judgment appropriate.   Assessment and Plan  1. Essential hypertension  -  EKG 12-Lead - Urinalysis, Routine w reflex microscopic - Microalbumin / creatinine urine ratio - CBC with Differential/Platelet - COMPLETE METABOLIC PANEL WITH GFR - Magnesium - TSH  2. Hyperlipidemia, mixed  - EKG 12-Lead - Lipid panel - TSH  3. Abnormal glucose  - EKG 12-Lead - Hemoglobin A1c - Insulin, random  4. Vitamin D deficiency  - VITAMIN D 25 Hydroxy  5. Screening for colorectal cancer  - POC Hemoccult Bld/Stl  6. Encounter for screening for malignant  neoplasm of respiratory organs   7. Screening for ischemic heart disease  - EKG 12-Lead  8. FHx: heart disease  - EKG 12-Lead  9. Smoker  - EKG  12-Lead  10. Aortic atherosclerosis ([facility])  - EKG 12-Lead  11. Medication management  - Urinalysis, Routine w reflex microscopic - Microalbumin / creatinine urine ratio - CBC with Differential/Platelet - COMPLETE METABOLIC PANEL WITH GFR - Magnesium - Lipid panel - TSH - Hemoglobin A1c - Insulin, random - VITAMIN D 25 Hydroxy       Patient was counseled in prudent diet to achieve/maintain BMI less than 25 for weight control, BP monitoring, regular exercise and medications. Discussed med's effects and SE's. Screening labs and tests as requested with regular follow-up as recommended. Over 40 minutes of exam, counseling, chart review and high complex critical decision making was performed.   Kirtland Bouchard, MD

## 2020-01-07 NOTE — Patient Instructions (Signed)

## 2020-01-08 ENCOUNTER — Ambulatory Visit (INDEPENDENT_AMBULATORY_CARE_PROVIDER_SITE_OTHER): Payer: Medicare Other | Admitting: Internal Medicine

## 2020-01-08 ENCOUNTER — Other Ambulatory Visit: Payer: Self-pay

## 2020-01-08 VITALS — BP 126/82 | HR 84 | Temp 97.8°F | Resp 16 | Ht 70.0 in | Wt 181.6 lb

## 2020-01-08 DIAGNOSIS — E559 Vitamin D deficiency, unspecified: Secondary | ICD-10-CM | POA: Diagnosis not present

## 2020-01-08 DIAGNOSIS — Z8249 Family history of ischemic heart disease and other diseases of the circulatory system: Secondary | ICD-10-CM

## 2020-01-08 DIAGNOSIS — I700 Atherosclerosis of aorta: Secondary | ICD-10-CM | POA: Diagnosis not present

## 2020-01-08 DIAGNOSIS — Z136 Encounter for screening for cardiovascular disorders: Secondary | ICD-10-CM | POA: Diagnosis not present

## 2020-01-08 DIAGNOSIS — Z79899 Other long term (current) drug therapy: Secondary | ICD-10-CM

## 2020-01-08 DIAGNOSIS — F17200 Nicotine dependence, unspecified, uncomplicated: Secondary | ICD-10-CM

## 2020-01-08 DIAGNOSIS — I10 Essential (primary) hypertension: Secondary | ICD-10-CM

## 2020-01-08 DIAGNOSIS — Z1211 Encounter for screening for malignant neoplasm of colon: Secondary | ICD-10-CM

## 2020-01-08 DIAGNOSIS — Z1212 Encounter for screening for malignant neoplasm of rectum: Secondary | ICD-10-CM

## 2020-01-08 DIAGNOSIS — R7309 Other abnormal glucose: Secondary | ICD-10-CM | POA: Diagnosis not present

## 2020-01-08 DIAGNOSIS — E782 Mixed hyperlipidemia: Secondary | ICD-10-CM

## 2020-01-08 DIAGNOSIS — Z122 Encounter for screening for malignant neoplasm of respiratory organs: Secondary | ICD-10-CM

## 2020-01-09 ENCOUNTER — Other Ambulatory Visit: Payer: Self-pay | Admitting: Internal Medicine

## 2020-01-09 DIAGNOSIS — E782 Mixed hyperlipidemia: Secondary | ICD-10-CM

## 2020-01-09 LAB — COMPLETE METABOLIC PANEL WITH GFR
AG Ratio: 1.7 (calc) (ref 1.0–2.5)
ALT: 14 U/L (ref 6–29)
AST: 16 U/L (ref 10–35)
Albumin: 4 g/dL (ref 3.6–5.1)
Alkaline phosphatase (APISO): 56 U/L (ref 37–153)
BUN: 13 mg/dL (ref 7–25)
CO2: 29 mmol/L (ref 20–32)
Calcium: 9 mg/dL (ref 8.6–10.4)
Chloride: 106 mmol/L (ref 98–110)
Creat: 0.86 mg/dL (ref 0.50–0.99)
GFR, Est African American: 80 mL/min/{1.73_m2} (ref >=60)
GFR, Est Non African American: 69 mL/min/{1.73_m2} (ref >=60)
Globulin: 2.3 g/dL (calc) (ref 1.9–3.7)
Glucose, Bld: 93 mg/dL (ref 65–99)
Potassium: 4.4 mmol/L (ref 3.5–5.3)
Sodium: 141 mmol/L (ref 135–146)
Total Bilirubin: 0.4 mg/dL (ref 0.2–1.2)
Total Protein: 6.3 g/dL (ref 6.1–8.1)

## 2020-01-09 LAB — URINALYSIS, ROUTINE W REFLEX MICROSCOPIC
Bacteria, UA: NONE SEEN /HPF
Bilirubin Urine: NEGATIVE
Glucose, UA: NEGATIVE
Hgb urine dipstick: NEGATIVE
Hyaline Cast: NONE SEEN /LPF
Ketones, ur: NEGATIVE
Nitrite: NEGATIVE
Protein, ur: NEGATIVE
RBC / HPF: NONE SEEN /HPF (ref 0–2)
Specific Gravity, Urine: 1.011 (ref 1.001–1.03)
Squamous Epithelial / HPF: NONE SEEN /HPF (ref <=5)
pH: 5.5 (ref 5.0–8.0)

## 2020-01-09 LAB — VITAMIN D 25 HYDROXY (VIT D DEFICIENCY, FRACTURES): Vit D, 25-Hydroxy: 40 ng/mL (ref 30–100)

## 2020-01-09 LAB — CBC WITH DIFFERENTIAL/PLATELET
Absolute Monocytes: 391 cells/uL (ref 200–950)
Basophils Absolute: 32 cells/uL (ref 0–200)
Basophils Relative: 0.7 %
Eosinophils Absolute: 189 cells/uL (ref 15–500)
Eosinophils Relative: 4.1 %
HCT: 42.4 % (ref 35.0–45.0)
Hemoglobin: 14.4 g/dL (ref 11.7–15.5)
Lymphs Abs: 1546 cells/uL (ref 850–3900)
MCH: 34.3 pg — ABNORMAL HIGH (ref 27.0–33.0)
MCHC: 34 g/dL (ref 32.0–36.0)
MCV: 101 fL — ABNORMAL HIGH (ref 80.0–100.0)
MPV: 10.1 fL (ref 7.5–12.5)
Monocytes Relative: 8.5 %
Neutro Abs: 2443 cells/uL (ref 1500–7800)
Neutrophils Relative %: 53.1 %
Platelets: 216 10*3/uL (ref 140–400)
RBC: 4.2 10*6/uL (ref 3.80–5.10)
RDW: 11.8 % (ref 11.0–15.0)
Total Lymphocyte: 33.6 %
WBC: 4.6 10*3/uL (ref 3.8–10.8)

## 2020-01-09 LAB — MICROALBUMIN / CREATININE URINE RATIO
Creatinine, Urine: 61 mg/dL (ref 20–275)
Microalb, Ur: <0.2 mg/dL

## 2020-01-09 LAB — LIPID PANEL
Cholesterol: 229 mg/dL — ABNORMAL HIGH (ref <200)
HDL: 62 mg/dL (ref >=50)
LDL Cholesterol (Calc): 137 mg/dL (calc) — ABNORMAL HIGH
Non-HDL Cholesterol (Calc): 167 mg/dL (calc) — ABNORMAL HIGH (ref <130)
Total CHOL/HDL Ratio: 3.7 (calc) (ref <5.0)
Triglycerides: 165 mg/dL — ABNORMAL HIGH (ref <150)

## 2020-01-09 LAB — TSH: TSH: 0.73 mIU/L (ref 0.40–4.50)

## 2020-01-09 LAB — MAGNESIUM: Magnesium: 2.1 mg/dL (ref 1.5–2.5)

## 2020-01-09 LAB — HEMOGLOBIN A1C
Hgb A1c MFr Bld: 4.5 % of total Hgb (ref <5.7)
Mean Plasma Glucose: 82 (calc)
eAG (mmol/L): 4.6 (calc)

## 2020-01-09 LAB — INSULIN, RANDOM: Insulin: 19.6 u[IU]/mL

## 2020-01-09 MED ORDER — ROSUVASTATIN CALCIUM 20 MG PO TABS: ORAL_TABLET | ORAL | 3 refills | Status: DC

## 2020-02-09 DIAGNOSIS — J329 Chronic sinusitis, unspecified: Secondary | ICD-10-CM | POA: Diagnosis not present

## 2020-03-29 DIAGNOSIS — Z20822 Contact with and (suspected) exposure to covid-19: Secondary | ICD-10-CM | POA: Diagnosis not present

## 2020-04-08 NOTE — Progress Notes (Deleted)
ANNUAL MEDICARE VISIT AND FOLLOW UP  Assessment:   Diagnoses and all orders for this visit:  Encounter for Annual Medicare Visit  COPD [facility]) Per imaging; patient denies sx; STOP SMOKING  Aortic atherosclerosis ([place]) Per CT 2019 Control blood pressure, cholesterol, glucose, increase exercise.   Essential hypertension At goal; continue current plan Monitor blood pressure at home; call if consistently over 130/80 Continue DASH diet.   Reminder to go to the ER if any CP, SOB, nausea, dizziness, severe HA, changes vision/speech, left arm numbness and tingling and jaw pain.  Abnormal glucose Recent A1Cs well controlled Discussed disease and risks Discussed diet/exercise, weight management  -     CMP WITH GFR  Medication management -     CBC with Differential/Platelet -     CMP/GFR  Mixed hyperlipidemia At goal with zetia and low dose pravastatin (hx of intolerance with higher doses) Continue to encourage low cholesterol diet, exercise, weight management -     Lipid panel -     TSH  Vitamin D deficiency Continue supplementation for goal of 60-100 Check vitamin D level biannually - has increased dose but hasn't taken this past week due to moving   CKD (chronic kidney disease), symptom management only, stage 2 (mild) Increase fluids, avoid NSAIDS, monitor sugars, will monitor -     CMP WITH GFR  BMI 24.0-24.9, adult *** At goal Recommended diet heavy in fruits and veggies and low in animal meats, cheeses, and dairy products, appropriate calorie intake Discussed exercise recommendations Continue to monitor at each visit  OAB Takes oxybutynin PRN and doing well   Estrogen deficiency -     DG Bone Density; Future  Smoker Intermittently smoking for an estimated 40 pack years. -lung cancer screening with low dose CT discussed as recommended by guidelines based on age, number of pack year history.  Discussed risks of screening including but not limited to false positives  on xray, further testing or consultation with specialist, and possible false negative CT as well. Patient would like to proceed with this and order placed.  ***  Need for mammogram Ordered to be scheduled through our office due to poor patient follow through ***  Anxiety/dysthymia Well controlled on zoloft 50 mg daily Stress management techniques discussed, increase water, good sleep hygiene discussed, increase exercise, and increase veggies.    Over 40 minutes of exam, counseling, chart review and critical decision making was performed Future Appointments  Date Time Provider Fate  04/09/2020 10:30 AM Liane Comber, NP [facility] None  07/16/2020 11:30 AM Unk Pinto, MD [facility] None  01/07/2021 11:00 AM Unk Pinto, MD [facility] None     Plan:   During the course of the visit the patient was educated and counseled about appropriate screening and preventive services including:    Pneumococcal vaccine   Prevnar 13  Influenza vaccine  Td vaccine  Screening electrocardiogram  Bone densitometry screening  Colorectal cancer screening  Diabetes screening  Glaucoma screening  Nutrition counseling   Advanced directives: requested   Subjective:  Brittany Hanna is a 69 y.o. female who presents for Medicare Annual Wellness Visit and 3 month follow up.   she currently continues to smoke ~1 pack a day; discussed risks associated with smoking, patient is not ready to quit.  She has 40 pack/year history; low dose CT screening has been discussed and she is agreeable  *** Last CXR in 2010 with COPD changes.  She has OAB on oxybutynin PRN and does well taking  on as needed, typically takes if she will be out in public for extended perids.    She has hx of depression/anxiety well controlled on zoloft 50 mg daily.   she has a diagnosis of GERD which is currently managed by omeprazole 40 mg PRN, takes rarely.  she reports symptoms is currently well  controlled, and denies breakthrough reflux, burning in chest, hoarseness or cough.    BMI is There is no height or weight on file to calculate BMI., she has been working on diet and exercise. Avoids meat, focuses on vegetables and fruit.  Wt Readings from Last 3 Encounters:  01/08/20 181 lb 9.6 oz (82.4 kg)  01/21/19 167 lb (75.8 kg)  10/04/18 175 lb 6.4 oz (79.6 kg)   She does not check her BP at home, today their BP is   She does workout. She denies chest pain, shortness of breath, dizziness.  She had negative cardiolite in 2010. She has aortic atherosclerosis per CT 2019.   She is on cholesterol medication (zetia 10 mg daily, rosuvastatin 20 mg daily ***) and denies myalgias. Severe reaction to higher dose statin in the past. Her LDL cholesterol is not at goal, trigs mildly elevated. The cholesterol last visit was:   Lab Results  Component Value Date   CHOL 229 (H) 01/08/2020   HDL 62 01/08/2020   LDLCALC 137 (H) 01/08/2020   TRIG 165 (H) 01/08/2020   CHOLHDL 3.7 01/08/2020    She has been working on diet and exercise for glucose management, and denies increased appetite, nausea, paresthesia of the feet, polydipsia, polyuria, visual disturbances, vomiting and weight loss. Last A1C in the office was:  Lab Results  Component Value Date   HGBA1C 4.5 01/08/2020    Last GFR: Lab Results  Component Value Date   GFRNONAA 69 01/08/2020   Patient is on Vitamin D supplement but remains well below goal of 60:   Lab Results  Component Value Date   VD25OH 40 01/08/2020      Medication Review: Current Outpatient Medications on File Prior to Visit  Medication Sig Dispense Refill  . aspirin EC 81 MG tablet Take 81 mg by mouth daily.    Marland Kitchen CALCIUM PO Take 1 tablet by mouth 3 (three) times a week. No specific days.    . Cholecalciferol (VITAMIN D PO) Take 5,000 Units by mouth in the morning and at bedtime.     Marland Kitchen ezetimibe (ZETIA) 10 MG tablet Take 1 tablet daily for Cholesterol 90 tablet 3   . hyoscyamine (LEVSIN SL) 0.125 MG SL tablet Place 1 tablet (0.125 mg total) every 4 (four) hours as needed under the tongue. 30 tablet 1  . omeprazole (PRILOSEC) 40 MG capsule Take 40 mg by mouth daily.     Marland Kitchen oxybutynin (DITROPAN) 5 MG tablet Take 1/2 to 1 tablet 2 to 3 times a day as needed for bladder. 360 tablet 3  . rosuvastatin (CRESTOR) 20 MG tablet Take 1 tablet Daily for Cholesterol 90 tablet 3  . sertraline (ZOLOFT) 100 MG tablet Take1 tablet  Daily for Mood 90 tablet 3   No current facility-administered medications on file prior to visit.    Allergies  Allergen Reactions  . Lipitor [Atorvastatin] Other (See Comments)    Fatigue.    Current Problems (verified) Patient Active Problem List   Diagnosis Date Noted  . COPD (chronic obstructive pulmonary disease) ([place]) 01/07/2019  . OAB (overactive bladder) 10/07/2018  . Dysthymia 10/07/2018  . Gastroesophageal reflux disease  10/07/2018  . Aortic atherosclerosis ([place]) 10/30/2017  . Smoker 10/24/2017  . Anxiety 10/24/2017  . CKD (chronic kidney disease) stage 2, GFR 60-89 ml/min 10/23/2017  . BMI 25.0-25.9,adult 10/23/2017  . Hyperlipidemia, mixed 08/08/2013  . Essential hypertension 08/08/2013  . Vitamin D deficiency 08/08/2013  . Abnormal glucose 08/08/2013    Screening Tests Immunization History  Administered Date(s) Administered  . Influenza, High Dose Seasonal PF 06/29/2018, 06/28/2019  . PPD Test 03/19/2014, 03/24/2015, 06/01/2016  . Pneumococcal Conjugate-13 07/24/2017  . Pneumococcal Polysaccharide-23 06/01/2016  . Tdap 01/24/2013  . Zoster 11/03/2013    Preventative care: Last colonoscopy: 2011 due 2021 Dr. Fuller Plan *** Last mammogram: 2012, schedule due to poor follow through *** Last pap smear/pelvic exam: remote - typically goes to GYN, plans to follow up *** DEXA: will schedule ***  Prior vaccinations: TD or Tdap: 2014  Influenza: 06/2019 Pneumococcal: 2017 Prevnar13: 2018  Shingles/Zostavax:  2015 Covid 19: ***  Names of Other Physician/Practitioners you currently use: 1. [facility] here for primary care 2. Dr. Jefm Bryant, eye doctor, last visit 2019 3. Puck[place], dentist, last visit 2019, goes q14m  Patient Care Team: Unk Pinto, MD as PCP - General (Internal Medicine)  SURGICAL HISTORY She  has a past surgical history that includes Tubal ligation and Cataract extraction, bilateral (Bilateral, 2019). FAMILY HISTORY Her family history includes COPD in her father; Heart failure in her mother; Hypertension in her father; Osteoarthritis in her father. SOCIAL HISTORY She  reports that she has been smoking cigarettes. She has a 40.00 pack-year smoking history. She has never used smokeless tobacco. She reports current alcohol use. She reports that she does not use drugs.   MEDICARE WELLNESS OBJECTIVES: Physical activity:   Cardiac risk factors:   Depression/mood screen:   Depression screen [facility] 2/9 01/07/2020  Decreased Interest 0  Down, Depressed, Hopeless 0  PHQ - 2 Score 0    ADLs:  In your present state of health, do you have any difficulty performing the following activities: 01/07/2020 10/28/2019  Hearing? N N  Vision? N N  Difficulty concentrating or making decisions? N N  Walking or climbing stairs? N N  Dressing or bathing? N N  Doing errands, shopping? N N  Some recent data might be hidden     Cognitive Testing  Alert? Yes  Normal Appearance?Yes  Oriented to person? Yes  Place? Yes   Time? Yes  Recall of three objects?  Yes  Can perform simple calculations? Yes  Displays appropriate judgment?Yes  Can read the correct time from a watch face?Yes  EOL planning:    Review of Systems  Constitutional: Negative for malaise/fatigue and weight loss.  HENT: Negative for hearing loss and tinnitus.   Eyes: Negative for blurred vision and double vision.  Respiratory: Negative for cough, shortness of breath and wheezing.    Cardiovascular: Negative for chest pain, palpitations, orthopnea, claudication and leg swelling.  Gastrointestinal: Negative for abdominal pain, blood in stool, constipation, diarrhea, heartburn, melena, nausea and vomiting.  Genitourinary: Negative.   Musculoskeletal: Negative for joint pain and myalgias.  Skin: Negative for rash.  Neurological: Negative for dizziness, tingling, sensory change, weakness and headaches.  Endo/Heme/Allergies: Negative for polydipsia.  Psychiatric/Behavioral: Negative.   All other systems reviewed and are negative.    Objective:     There were no vitals filed for this visit. There is no height or weight on file to calculate BMI.  General appearance: alert, no distress, WD/WN, female HEENT: normocephalic, sclerae  anicteric, TMs pearly, nares patent, no discharge or erythema, pharynx normal Oral cavity: MMM, no lesions Neck: supple, no lymphadenopathy, no thyromegaly, no masses Heart: RRR, normal S1, S2, no murmurs Lungs: CTA bilaterally, no wheezes, rhonchi, or rales Abdomen: +bs, soft, non tender, non distended, no masses, no hepatomegaly, no splenomegaly Musculoskeletal: nontender, no swelling, no obvious deformity Extremities: no edema, no cyanosis, no clubbing Pulses: 2+ symmetric, upper and lower extremities, normal cap refill Neurological: alert, oriented x 3, CN2-12 intact, strength normal upper extremities and lower extremities, sensation normal throughout, DTRs 2+ throughout, no cerebellar signs, gait normal Psychiatric: normal affect, behavior normal, pleasant    Medicare Attestation I have personally reviewed: The patient's medical and social history Their use of alcohol, tobacco or illicit drugs Their current medications and supplements The patient's functional ability including ADLs,fall risks, home safety risks, cognitive, and hearing and visual impairment Diet and physical activities Evidence for depression or mood disorders  The  patient's weight, height, BMI, and visual acuity have been recorded in the chart.  I have made referrals, counseling, and provided education to the patient based on review of the above and I have provided the patient with a written personalized care plan for preventive services.     Izora Ribas, NP   04/08/2020

## 2020-04-09 ENCOUNTER — Ambulatory Visit: Payer: Medicare Other | Admitting: Adult Health

## 2020-04-16 DIAGNOSIS — Z23 Encounter for immunization: Secondary | ICD-10-CM | POA: Diagnosis not present

## 2020-05-07 DIAGNOSIS — Z23 Encounter for immunization: Secondary | ICD-10-CM | POA: Diagnosis not present

## 2020-05-08 DIAGNOSIS — Z03818 Encounter for observation for suspected exposure to other biological agents ruled out: Secondary | ICD-10-CM | POA: Diagnosis not present

## 2020-05-08 DIAGNOSIS — Z1152 Encounter for screening for COVID-19: Secondary | ICD-10-CM | POA: Diagnosis not present

## 2020-07-16 ENCOUNTER — Ambulatory Visit: Payer: Medicare Other | Admitting: Internal Medicine

## 2020-08-21 DIAGNOSIS — Z20822 Contact with and (suspected) exposure to covid-19: Secondary | ICD-10-CM | POA: Diagnosis not present

## 2020-11-12 ENCOUNTER — Encounter: Payer: Medicare Other | Admitting: Internal Medicine

## 2020-11-19 ENCOUNTER — Ambulatory Visit (INDEPENDENT_AMBULATORY_CARE_PROVIDER_SITE_OTHER): Payer: Medicare Other | Admitting: Internal Medicine

## 2020-11-19 ENCOUNTER — Other Ambulatory Visit: Payer: Self-pay

## 2020-11-19 ENCOUNTER — Encounter: Payer: Self-pay | Admitting: Internal Medicine

## 2020-11-19 VITALS — BP 140/86 | HR 78 | Temp 98.1°F | Resp 16 | Ht 70.0 in | Wt 167.2 lb

## 2020-11-19 DIAGNOSIS — F341 Dysthymic disorder: Secondary | ICD-10-CM | POA: Diagnosis not present

## 2020-11-19 DIAGNOSIS — F320 Major depressive disorder, single episode, mild: Secondary | ICD-10-CM

## 2020-11-19 DIAGNOSIS — F4329 Adjustment disorder with other symptoms: Secondary | ICD-10-CM

## 2020-11-19 DIAGNOSIS — F4381 Prolonged grief disorder: Secondary | ICD-10-CM

## 2020-11-19 DIAGNOSIS — Z23 Encounter for immunization: Secondary | ICD-10-CM

## 2020-11-19 MED ORDER — ALPRAZOLAM 0.25 MG PO TABS: ORAL_TABLET | ORAL | 0 refills | Status: DC

## 2020-11-19 MED ORDER — SERTRALINE HCL 100 MG PO TABS: ORAL_TABLET | ORAL | 1 refills | Status: DC

## 2020-11-19 NOTE — Patient Instructions (Signed)
Complicated Grief Grief is a normal response to the death of someone close to you. Feelings of fear, anger, and guilt can affect almost everyone who loses a loved one. It is also common to have symptoms of depression while you are grieving. These include problems with sleep, loss of appetite, and lack of energy. They may last for weeks or months after a loss. Complicated grief is different from normal grief or depression. Normal grieving involves sadness and feelings of loss, but those feelings get better and heal over time. Complicated grief is a severe type of grief that lasts for a long time, usually for several months to a year or longer. It interferes with your ability to function normally. Complicated grief may require treatment from a mental health care provider. What are the causes? The cause of this condition is not known. It is not clear why some people continue to struggle with grief and others do not. What increases the risk? You are more likely to develop this condition if:  The death of your loved one was sudden or unexpected.  The death of your loved one was due to a violent event.  Your loved one died from suicide.  Your loved one was a child or a young person.  You were very close to your loved one, or you were dependent on him or her.  You have a history of depression or anxiety. What are the signs or symptoms? Symptoms of this condition include:  Feeling disbelief or having a lack of emotion (numbness).  Being unable to enjoy good memories of your loved one.  Needing to avoid anything or anyone that reminds you of your loved one.  Being unable to stop thinking about the death.  Feeling intense anger or guilt.  Feeling alone and hopeless.  Feeling that your life is meaningless and empty.  Losing the desire to move on with your life. How is this diagnosed? This condition may be diagnosed based on:  Your symptoms. Complicated grief will be diagnosed if you have  ongoing symptoms of grief for 6-12 months or longer.  The effect of symptoms on your life. You may be diagnosed with this condition if your symptoms are interfering with your ability to live your life. Your health care provider may recommend that you see a mental health care provider. Many symptoms of depression are similar to the symptoms of complicated grief. It is important to be evaluated for complicated grief along with other mental health conditions. How is this treated? This condition is most commonly treated with talk therapy. This therapy is offered by a mental health specialist (psychiatrist). During therapy:  You will learn healthy ways to cope with the loss of your loved one.  Your mental health care provider may recommend antidepressant medicines.   Follow these instructions at home: Lifestyle  Take care of yourself. ? Eat on a regular basis, and maintain a healthy diet. Eat plenty of fruits, vegetables, lean protein, and whole grains. ? Try to get some exercise each day. Aim for 30 minutes of exercise on most days of the week. ? Keep a consistent sleep schedule. Try to get 8 or more hours of sleep each night. ? Start doing the things that you used to enjoy.  Do not use drugs or alcohol to ease your symptoms.  Spend time with friends and loved ones.   General instructions  Take over-the-counter and prescription medicines only as told by your health care provider.  Consider joining a grief (  bereavement) support group to help you deal with your loss.  Keep all follow-up visits as told by your health care provider. This is important. Contact a health care provider if:  Your symptoms prevent you from functioning normally.  Your symptoms do not get better with treatment. Get help right away if:  You have serious thoughts about hurting yourself or someone else.  You have suicidal feelings. If you ever feel like you may hurt yourself or others, or have thoughts about  taking your own life, get help right away. You can go to your nearest emergency department or call:  Your local emergency services (911 in the [place]  A suicide crisis helpline, such as the National Suicide Prevention Lifeline at 1-800-273-8255. This is open 24 hours a day. Summary  Complicated grief is a severe type of grief that lasts for a long time. This grief is not likely to go away on its own. Get the help you need.  Some griefs are more difficult than others and can cause this condition. You may need a certain type of treatment to help you recover if the loss of your loved one was sudden, violent, or due to suicide.  You may feel guilty about moving on with your life. Getting help does not mean that you are forgetting your loved one. It means that you are taking care of yourself.  Complicated grief is best treated with talk therapy. Medicines may also be prescribed.  Seek the help you need, and find support that will help you recover. This information is not intended to replace advice given to you by your health care provider. Make sure you discuss any questions you have with your health care provider. Document Revised: 02/13/2020 Document Reviewed: 02/13/2020 Elsevier Patient Education  2021 Elsevier Inc.  

## 2020-11-19 NOTE — Progress Notes (Signed)
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 She relates ongoing daily grieving & crying.  She reports disrupted sleep pattern and frequently unable to go to work.  She has a rx for Sertraline which she takes sporadically.   Medications (Patient has stopped ALL of her medicines)  .  ezetimibe (ZETIA) 10 MG tablet, Take 1 tablet daily for Cholesterol (Patient not taking: Reported on 11/19/2020)  .  rosuvastatin (CRESTOR) 20 MG tablet, Take 1 tablet Daily for Cholesterol (Patient not taking: Reported on 11/19/2020)  .  aspirin EC 81 MG tablet, Take 81 mg by mouth daily. (Patient not taking: Reported on 11/19/2020)  .  CALCIUM PO, Take 1 tablet by mouth 3 (three) times a week. No specific days. (Patient not taking: Reported on 11/19/2020)  .  Cholecalciferol (VITAMIN D PO), Take 5,000 Units by mouth in the morning and at bedtime.  (Patient not taking: Reported on 11/19/2020)  .  hyoscyamine (LEVSIN SL) 0.125 MG SL tablet, Place 1 tablet (0.125 mg total) every 4 (four) hours as needed under the tongue. (Patient not taking: Reported on 11/19/2020)  .  omeprazole (PRILOSEC) 40 MG capsule, Take 40 mg by mouth daily.  (Patient not taking: Reported on 11/19/2020)  .  oxybutynin (DITROPAN) 5 MG tablet, Take 1/2 to 1 tablet 2 to 3 times a day as needed for bladder. (Patient not taking: Reported on 11/19/2020)  .  sertraline (ZOLOFT) 100 MG tablet, Take 1 tablet  Daily for Mood  Problem list She has Hyperlipidemia, mixed; Essential hypertension; Vitamin D deficiency; Abnormal glucose; CKD (chronic kidney disease) stage 2, GFR 60-89 ml/min; BMI 25.0-25.9,adult; Smoker; Anxiety; Aortic atherosclerosis ([place]); OAB (overactive bladder); Dysthymia; Gastroesophageal reflux disease; and COPD (chronic obstructive  pulmonary disease) ([facility]) on their problem list.   Observations/Objective:  Pulse 78   Temp 98.1 F (36.7 C)   Resp 16   Ht 5\' 10"  (1.778 m)   Wt 167 lb 3.2 oz (75.8 kg)   SpO2 99%   BMI 23.99 kg/m   Tearful.   HEENT - WNL. Neck - supple.  Chest - Clear equal BS. Cor - Nl HS. RRR w/o sig M. MS- FROM w/o deformities.  Gait Nl. Neuro -  Nl w/o focal abnormalities. No SI.  Assessment and Plan:  1. Grief reaction with prolonged bereavement  - Patient is agreeable for counseling and is given contact # for Psychologist Dr Altha Harm.  - Patient is agreeable to restart meds. Discussed meds & Side-effects. - sertraline (ZOLOFT) 100 MG tablet; Take  1 tablet  Daily  for Mood  Dispense: 90 tablet; Refill: 1 - ALPRAZolam (XANAX) 0.25 MG tablet; Take  1 tablet   3 x /day  for Anxiety  Dispense: 90 tablet; Refill: 0  2. Current mild episode of major depressive disorder without prior episode (H[facility])  - sertraline (ZOLOFT) 100 MG tablet; Take  1 tablet  Daily  for Mood  Dispense: 90 tablet; Refill: 1 - ALPRAZolam (XANAX) 0.25 MG tablet; Take  1 tablet   3 x /day  for Anxiety  Dispense: 90 tablet; Refill: 0  3. Dysthymia  - sertraline (ZOLOFT) 100 MG tablet; Take  1 tablet  Daily  for Mood  Dispense: 90 tablet; Refill: 1  4. Need for prophylactic vaccination against Streptococcus pneumoniae (pneumococcus)   Follow Up Instructions:       I discussed the assessment and treatment plan with the patient. The patient was provided an opportunity to ask questions and all were answered. The patient agreed with the plan and demonstrated an understanding of the instructions.       The patient was advised to call back or seek an in-person evaluation if the symptoms worsen or if the condition fails to improve as anticipated.   Kirtland Bouchard, MD

## 2021-01-06 ENCOUNTER — Encounter: Payer: Self-pay | Admitting: Internal Medicine

## 2021-01-06 NOTE — Progress Notes (Signed)
Comprehensive Evaluation &  Examination  Future Appointments  Date Time Provider [place]  01/07/2021 11:00 AM Unk Pinto, MD [facility] None  01/11/2022 11:00 AM Unk Pinto, MD [facility] None        This very nice 69 y.o.  [facility] presents for a  comprehensive evaluation and management of multiple medical co-morbidities.  Patient has been followed for HTN, HLD, Prediabetes  and Vitamin D Deficiency.Abd CT scan on 10/30/2017 dir show Aortic Atherosclerosis. Patient has COPD consequent of a smoking hx/o 1 ppd x 40 + years. Patient has hx/o a low Vit B12 level of "226" in the past .      In March 2022, patient indicated that she had stopped virtually all of her meds in response to the untimely death of a son in [place]  04/13/2020 with Covid PNA  including bASA, calcium supplements, Vitamin D, Zetia, Levsin, Prilosec, Oxybutynin and Crestor.  She was agreeable to restarting Sertraline & that time & did agree to a referral to Dr Romilda Joy - Psychologist, but was "unable" to schedule (?).        Labile HTN predates circa 2005.  In 2015, patient had a negative Cardiolite.Patient's BP has been controlled at home and patient denies any cardiac symptoms as chest pain, palpitations, shortness of breath, dizziness or ankle swelling. Today's BP is at goal -110/80.        Patient's hyperlipidemia is controlled with diet and medications. Patient denies myalgias or other medication SE's. Last lipids were not at goal:  Lab Results  Component Value Date   CHOL 229 (H) 01/08/2020   HDL 62 01/08/2020   LDLCALC 137 (H) 01/08/2020   TRIG 165 (H) 01/08/2020   CHOLHDL 3.7 01/08/2020        Patient has been monitored expectantly for glucose intolerance and in 2017 had an elevated insulin level of 27+.    Patient denies reactive hypoglycemic symptoms, visual blurring, diabetic polys or paresthesias. Last A1c was normal & at goal:  Lab Results  Component Value Date   HGBA1C 4.5 01/08/2020         Finally, patient has history of Vitamin D Deficiency ("10" /2008)and last Vitamin D was still low (goal 70-100):  Lab Results  Component Value Date   VD25OH 40 01/08/2020     Current Outpatient Medications on File Prior to Visit  Medication Sig  . ALPRAZolam (XANAX) 0.25 MG tablet Take  1 tablet   3 x /day  for Anxiety  . sertraline (ZOLOFT) 100 MG tablet Take  1 tablet  Daily  for Mood    Allergies  Allergen Reactions  . Lipitor [Atorvastatin] Fatigue.     Past Medical History:  Diagnosis Date  . B12 deficiency   . FHx: heart disease 10/07/2018  . High cholesterol   . Hypertension   . IBS (irritable bowel syndrome)   . Vitamin D deficiency      Health Maintenance  Topic Date Due  . MAMMOGRAM  01/25/2013  . DEXA SCAN  Never done  . COLONOSCOPY  03/30/2020  . COVID-19 Vaccine (2 - Pfizer 3-dose series) 05/07/2020  . INFLUENZA VACCINE  04/05/2021  . PNA vac Low Risk Adult (2 of 2 - PPSV23) 06/01/2021  . TETANUS/TDAP  01/25/2023  . Hepatitis C Screening  Completed  . HPV VACCINES  Aged Out     Immunization History  Administered Date(s) Administered  . Fluad Quad(high Dose 65+) 07/10/2020  . Influenza, High Dose   06/29/2018, 06/28/2019  .  PFIZER  SARS-COV-2 Vacci  04/16/2020  . PPD Test 03/19/2014, 03/24/2015, 06/01/2016  . Pneumococcal -13 07/24/2017  . Pneumococcal  -23 06/01/2016  . Tdap 01/24/2013  . Zoster 11/03/2013    Last Colon - 03/30/2010 - Dr Fuller Plan - recc 10 yr f/u Colon overdue  Since Aug 2021   Last MGM - 02/01/2011 - Patient aware overdue - Patient was encouraged to schedule.   Past Surgical History:  Procedure Laterality Date  . CATARACT EXTRACTION, BILATERAL Bilateral 2019   Dr. Tommy Rainwater   . TUBAL LIGATION       Family History  Problem Relation Age of Onset  . Heart failure Mother   . Osteoarthritis Father   . Hypertension Father   . COPD Father      Social History   Tobacco Use  . Smoking status: Current Some Day  Smoker    Packs/day: 1.00    Years: 40.00    Pack years: 40.00    Types: Cigarettes  . Smokeless tobacco: Never Used  Substance Use Topics  . Alcohol use: Yes    Comment: occasionally  . Drug use: No      ROS Constitutional: Denies fever, chills, weight loss/gain, headaches, insomnia,  night sweats, and change in appetite. Does c/o fatigue. Eyes: Denies redness, blurred vision, diplopia, discharge, itchy, watery eyes.  ENT: Denies discharge, congestion, post nasal drip, epistaxis, sore throat, earache, hearing loss, dental pain, Tinnitus, Vertigo, Sinus pain, snoring.  Cardio: Denies chest pain, palpitations, irregular heartbeat, syncope, dyspnea, diaphoresis, orthopnea, PND, claudication, edema Respiratory: denies cough, dyspnea, DOE, pleurisy, hoarseness, laryngitis, wheezing.  Gastrointestinal: Denies dysphagia, heartburn, reflux, water brash, pain, cramps, nausea, vomiting, bloating, diarrhea, constipation, hematemesis, melena, hematochezia, jaundice, hemorrhoids Genitourinary: Denies dysuria, frequency, urgency, nocturia, hesitancy, discharge, hematuria, flank pain Breast: Breast lumps, nipple discharge, bleeding.  Musculoskeletal: Denies arthralgia, myalgia, stiffness, Jt. Swelling, pain, limp, and strain/sprain. Denies falls. Skin: Denies puritis, rash, hives, warts, acne, eczema, changing in skin lesion Neuro: No weakness, tremor, incoordination, spasms, paresthesia, pain Psychiatric: Denies confusion, memory loss, sensory loss. Denies Depression. Endocrine: Denies change in weight, skin, hair change, nocturia, and paresthesia, diabetic polys, visual blurring, hyper / hypo glycemic episodes.  Heme/Lymph: No excessive bleeding, bruising, enlarged lymph nodes.  Physical Exam  BP 110/80   Pulse 72   Temp (!) 97.5 F (36.4 C)   Resp 16   Ht 5\' 10"  (1.778 m)   Wt 171 lb (77.6 kg)   SpO2 98%   BMI 24.54 kg/m   General Appearance: Well nourished, well groomed and in no  apparent distress.  Eyes: PERRLA, EOMs, conjunctiva no swelling or erythema, normal fundi and vessels. Sinuses: No frontal/maxillary tenderness ENT/Mouth: EACs patent / TMs  nl. Nares clear without erythema, swelling, mucoid exudates. Oral hygiene is good. No erythema, swelling, or exudate. Tongue normal, non-obstructing. Tonsils not swollen or erythematous. Hearing normal.  Neck: Supple, thyroid not palpable. No bruits, nodes or JVD. Respiratory: Respiratory effort normal.  BS equal and clear bilateral without rales, rhonci, wheezing or stridor. Cardio: Heart sounds are normal with regular rate and rhythm and no murmurs, rubs or gallops. Peripheral pulses are normal and equal bilaterally without edema. No aortic or femoral bruits. Chest: symmetric with normal excursions and percussion. Breasts: Symmetric, without lumps, nipple discharge, retractions, or fibrocystic changes.  Abdomen: Flat, soft with bowel sounds active. Nontender, no guarding, rebound, hernias, masses, or organomegaly.  Lymphatics: Non tender without lymphadenopathy.  Musculoskeletal: Full ROM all peripheral extremities, joint stability, 5/5 strength, and normal  gait. Skin: Warm and dry without rashes, lesions, cyanosis, clubbing or  ecchymosis.  Neuro: Cranial nerves intact, reflexes equal bilaterally. Normal muscle tone, no cerebellar symptoms. Sensation intact.  Pysch: Alert and oriented X 3, normal affect, Insight and Judgment appropriate.    Assessment and Plan   1. Essential hypertension  - EKG 12-Lead - Urinalysis, Routine w reflex microscopic - Microalbumin / creatinine urine ratio - CBC with Differential/Platelet - COMPLETE METABOLIC PANEL WITH GFR - Magnesium - TSH  2. Hyperlipidemia, mixed  - EKG 12-Lead - Lipid panel - TSH  3. Abnormal glucose  - EKG 12-Lead - Hemoglobin A1c - Insulin, random  4. Vitamin D deficiency  - VITAMIN D 25 Hydroxy   5. Screening for colorectal cancer  - POC  Hemoccult Bld/Stl  6. Screening for ischemic heart disease  - EKG 12-Lead  7. CKD (chronic kidney disease) stage 2, GFR 60-89 ml/min  - Urinalysis, Routine w reflex microscopic - COMPLETE METABOLIC PANEL WITH GFR  8. Vitamin B12 deficiency  - Vitamin B12  9. Chronic bronchitis ([place])   10. Aortic atherosclerosis (HCC) bny Abd CT scan on 10/30/2017  - EKG 12-Lead  11. FHx: heart disease  - EKG 12-Lead  12. Smoker  - EKG 12-Lead  13. Medication management  - Urinalysis, Routine w reflex microscopic - Microalbumin / creatinine urine ratio - CBC with Differential/Platelet - COMPLETE METABOLIC PANEL WITH GFR - Magnesium - Lipid panel - TSH - Hemoglobin A1c - Insulin, random - VITAMIN D 25 Hydroxy         Patient was counseled in prudent diet to achieve/maintain BMI less than 25 for weight control, BP monitoring, regular exercise and medications. Discussed med's effects and SE's. Screening labs and tests as requested with regular follow-up as recommended. Over 40 minutes of exam, counseling, chart review and high complex critical decision making was performed.   Kirtland Bouchard, MD

## 2021-01-06 NOTE — Patient Instructions (Signed)

## 2021-01-07 ENCOUNTER — Other Ambulatory Visit: Payer: Self-pay

## 2021-01-07 ENCOUNTER — Ambulatory Visit (INDEPENDENT_AMBULATORY_CARE_PROVIDER_SITE_OTHER): Payer: Medicare Other | Admitting: Internal Medicine

## 2021-01-07 VITALS — BP 110/80 | HR 72 | Temp 97.5°F | Resp 16 | Ht 70.0 in | Wt 171.0 lb

## 2021-01-07 DIAGNOSIS — Z79899 Other long term (current) drug therapy: Secondary | ICD-10-CM

## 2021-01-07 DIAGNOSIS — I700 Atherosclerosis of aorta: Secondary | ICD-10-CM

## 2021-01-07 DIAGNOSIS — E559 Vitamin D deficiency, unspecified: Secondary | ICD-10-CM | POA: Diagnosis not present

## 2021-01-07 DIAGNOSIS — Z136 Encounter for screening for cardiovascular disorders: Secondary | ICD-10-CM

## 2021-01-07 DIAGNOSIS — F17200 Nicotine dependence, unspecified, uncomplicated: Secondary | ICD-10-CM

## 2021-01-07 DIAGNOSIS — I10 Essential (primary) hypertension: Secondary | ICD-10-CM

## 2021-01-07 DIAGNOSIS — Z8249 Family history of ischemic heart disease and other diseases of the circulatory system: Secondary | ICD-10-CM | POA: Diagnosis not present

## 2021-01-07 DIAGNOSIS — E782 Mixed hyperlipidemia: Secondary | ICD-10-CM | POA: Diagnosis not present

## 2021-01-07 DIAGNOSIS — J42 Unspecified chronic bronchitis: Secondary | ICD-10-CM

## 2021-01-07 DIAGNOSIS — Z1211 Encounter for screening for malignant neoplasm of colon: Secondary | ICD-10-CM

## 2021-01-07 DIAGNOSIS — R7309 Other abnormal glucose: Secondary | ICD-10-CM

## 2021-01-07 DIAGNOSIS — E538 Deficiency of other specified B group vitamins: Secondary | ICD-10-CM | POA: Diagnosis not present

## 2021-01-07 DIAGNOSIS — N182 Chronic kidney disease, stage 2 (mild): Secondary | ICD-10-CM

## 2021-01-07 MED ORDER — ROSUVASTATIN CALCIUM 20 MG PO TABS: 20.0000 mg | ORAL_TABLET | Freq: Every day | ORAL | 0 refills | Status: DC

## 2021-01-08 LAB — COMPLETE METABOLIC PANEL WITH GFR
AG Ratio: 1.6 (calc) (ref 1.0–2.5)
ALT: 12 U/L (ref 6–29)
AST: 16 U/L (ref 10–35)
Albumin: 4.1 g/dL (ref 3.6–5.1)
Alkaline phosphatase (APISO): 45 U/L (ref 37–153)
BUN: 9 mg/dL (ref 7–25)
CO2: 26 mmol/L (ref 20–32)
Calcium: 9.1 mg/dL (ref 8.6–10.4)
Chloride: 105 mmol/L (ref 98–110)
Creat: 0.85 mg/dL (ref 0.50–0.99)
GFR, Est African American: 81 mL/min/{1.73_m2} (ref >=60)
GFR, Est Non African American: 70 mL/min/{1.73_m2} (ref >=60)
Globulin: 2.6 g/dL (calc) (ref 1.9–3.7)
Glucose, Bld: 94 mg/dL (ref 65–99)
Potassium: 4.1 mmol/L (ref 3.5–5.3)
Sodium: 140 mmol/L (ref 135–146)
Total Bilirubin: 0.7 mg/dL (ref 0.2–1.2)
Total Protein: 6.7 g/dL (ref 6.1–8.1)

## 2021-01-08 LAB — CBC WITH DIFFERENTIAL/PLATELET
Absolute Monocytes: 515 cells/uL (ref 200–950)
Basophils Absolute: 31 cells/uL (ref 0–200)
Basophils Relative: 0.7 %
Eosinophils Absolute: 189 cells/uL (ref 15–500)
Eosinophils Relative: 4.3 %
HCT: 42 % (ref 35.0–45.0)
Hemoglobin: 14.3 g/dL (ref 11.7–15.5)
Lymphs Abs: 1228 cells/uL (ref 850–3900)
MCH: 33.9 pg — ABNORMAL HIGH (ref 27.0–33.0)
MCHC: 34 g/dL (ref 32.0–36.0)
MCV: 99.5 fL (ref 80.0–100.0)
MPV: 10.3 fL (ref 7.5–12.5)
Monocytes Relative: 11.7 %
Neutro Abs: 2438 cells/uL (ref 1500–7800)
Neutrophils Relative %: 55.4 %
Platelets: 221 10*3/uL (ref 140–400)
RBC: 4.22 10*6/uL (ref 3.80–5.10)
RDW: 11.9 % (ref 11.0–15.0)
Total Lymphocyte: 27.9 %
WBC: 4.4 10*3/uL (ref 3.8–10.8)

## 2021-01-08 LAB — LIPID PANEL
Cholesterol: 184 mg/dL (ref <200)
HDL: 69 mg/dL (ref >=50)
LDL Cholesterol (Calc): 98 mg/dL (calc)
Non-HDL Cholesterol (Calc): 115 mg/dL (calc) (ref <130)
Total CHOL/HDL Ratio: 2.7 (calc) (ref <5.0)
Triglycerides: 78 mg/dL (ref <150)

## 2021-01-08 LAB — URINALYSIS, ROUTINE W REFLEX MICROSCOPIC
Bacteria, UA: NONE SEEN /HPF
Bilirubin Urine: NEGATIVE
Glucose, UA: NEGATIVE
Hgb urine dipstick: NEGATIVE
Hyaline Cast: NONE SEEN /LPF
Ketones, ur: NEGATIVE
Nitrite: NEGATIVE
Protein, ur: NEGATIVE
RBC / HPF: NONE SEEN /HPF (ref 0–2)
Specific Gravity, Urine: 1.003 (ref 1.001–1.035)
Squamous Epithelial / HPF: NONE SEEN /HPF (ref <=5)
pH: 7 (ref 5.0–8.0)

## 2021-01-08 LAB — MAGNESIUM: Magnesium: 2 mg/dL (ref 1.5–2.5)

## 2021-01-08 LAB — HEMOGLOBIN A1C
Hgb A1c MFr Bld: 4.8 % of total Hgb (ref <5.7)
Mean Plasma Glucose: 91 mg/dL
eAG (mmol/L): 5 mmol/L

## 2021-01-08 LAB — VITAMIN D 25 HYDROXY (VIT D DEFICIENCY, FRACTURES): Vit D, 25-Hydroxy: 52 ng/mL (ref 30–100)

## 2021-01-08 LAB — VITAMIN B12: Vitamin B-12: 285 pg/mL (ref 200–1100)

## 2021-01-08 LAB — MICROALBUMIN / CREATININE URINE RATIO
Creatinine, Urine: 13 mg/dL — ABNORMAL LOW (ref 20–275)
Microalb Creat Ratio: 15 mcg/mg creat (ref <30)
Microalb, Ur: 0.2 mg/dL

## 2021-01-08 LAB — INSULIN, RANDOM: Insulin: 6.5 u[IU]/mL

## 2021-01-08 LAB — MICROSCOPIC MESSAGE

## 2021-01-08 LAB — TSH: TSH: 0.9 mIU/L (ref 0.40–4.50)

## 2021-01-10 NOTE — Progress Notes (Signed)
============================================================ ============================================================  -    Total Cho = 184  Excellent    and   LDL Chol = 98 - Both Excellent  !  - Very low risk for Heart Attack  / Stroke ============================================================ ============================================================  -  A1c - Normal - Great - No Diabetes  ! ============================================================ ============================================================  -  Vitamin D =52 - Sl Low  - - Vitamin D goal is between 70-100.   - Please make sure that you are taking your Vitamin D 10,000 units /day   - It is very important as a natural anti-inflammatory and helping the  immune system protect against viral infections, like the Covid-19    helping hair, skin, and nails, as well as reducing stroke and  heart attack risk.   - It helps your bones and helps with mood.  - It also decreases numerous cancer risks so please  take it as directed.   - Low Vit D is associated with a 200-300% higher risk for  CANCER   and 200-300% higher risk for HEART   ATTACK  &  STROKE.    - It is also associated with higher death rate at younger ages,   autoimmune diseases like Rheumatoid arthritis, Lupus,  Multiple Sclerosis.     - Also many other serious conditions, like depression, Alzheimer's  Dementia, infertility, muscle aches, fatigue, fibromyalgia   - just to name a few. =========================================================== ===========================================================  -   -  Vitamin B12 =  285   ->   Very Low  (Ideal or Goal Vit B12 is between 450 - 1,100)   Low Vit B12 may be associated with Anemia , Fatigue,   Peripheral Neuropathy, Dementia, "Brain Fog", & Depression  - Recommend take a sub-lingual form of Vitamin B12 tablet   1,000 to 5,000 mcg tab that you dissolve under your tongue /Daily    - Can get Baron Sane - best price at [employer] or on [employer] =========================================================== ===========================================================  -  All Else - CBC - Kidneys -  U/A - Electrolytes - Liver - Magnesium & Thyroid    - all  Normal / OK =========================================================== ===========================================================

## 2021-02-02 ENCOUNTER — Other Ambulatory Visit: Payer: Self-pay

## 2021-02-02 ENCOUNTER — Ambulatory Visit (INDEPENDENT_AMBULATORY_CARE_PROVIDER_SITE_OTHER): Payer: Medicare Other | Admitting: Adult Health

## 2021-02-02 ENCOUNTER — Emergency Department
Admission: EM | Admit: 2021-02-02 | Discharge: 2021-02-02 | Disposition: A | Discharge status: Home / Self Care | Payer: Medicare Other | Attending: Emergency Medicine | Admitting: Emergency Medicine

## 2021-02-02 ENCOUNTER — Emergency Department: Payer: Medicare Other

## 2021-02-02 ENCOUNTER — Encounter: Payer: Self-pay | Admitting: Adult Health

## 2021-02-02 VITALS — BP 136/66 | HR 72 | Temp 97.2°F | Wt 171.0 lb

## 2021-02-02 DIAGNOSIS — Z7982 Long term (current) use of aspirin: Secondary | ICD-10-CM | POA: Insufficient documentation

## 2021-02-02 DIAGNOSIS — F17210 Nicotine dependence, cigarettes, uncomplicated: Secondary | ICD-10-CM | POA: Insufficient documentation

## 2021-02-02 DIAGNOSIS — J449 Chronic obstructive pulmonary disease, unspecified: Secondary | ICD-10-CM | POA: Diagnosis not present

## 2021-02-02 DIAGNOSIS — I129 Hypertensive chronic kidney disease with stage 1 through stage 4 chronic kidney disease, or unspecified chronic kidney disease: Secondary | ICD-10-CM | POA: Insufficient documentation

## 2021-02-02 DIAGNOSIS — R0789 Other chest pain: Secondary | ICD-10-CM | POA: Insufficient documentation

## 2021-02-02 DIAGNOSIS — R1011 Right upper quadrant pain: Secondary | ICD-10-CM | POA: Diagnosis not present

## 2021-02-02 DIAGNOSIS — M546 Pain in thoracic spine: Secondary | ICD-10-CM | POA: Diagnosis not present

## 2021-02-02 DIAGNOSIS — R109 Unspecified abdominal pain: Secondary | ICD-10-CM | POA: Diagnosis not present

## 2021-02-02 DIAGNOSIS — N182 Chronic kidney disease, stage 2 (mild): Secondary | ICD-10-CM | POA: Insufficient documentation

## 2021-02-02 DIAGNOSIS — M549 Dorsalgia, unspecified: Secondary | ICD-10-CM | POA: Diagnosis not present

## 2021-02-02 DIAGNOSIS — J439 Emphysema, unspecified: Secondary | ICD-10-CM | POA: Diagnosis not present

## 2021-02-02 DIAGNOSIS — M79602 Pain in left arm: Secondary | ICD-10-CM | POA: Diagnosis not present

## 2021-02-02 DIAGNOSIS — R079 Chest pain, unspecified: Secondary | ICD-10-CM | POA: Diagnosis not present

## 2021-02-02 LAB — CBC
HCT: 40.5 % (ref 36.0–46.0)
Hemoglobin: 14.5 g/dL (ref 12.0–15.0)
MCH: 34.5 pg — ABNORMAL HIGH (ref 26.0–34.0)
MCHC: 35.8 g/dL (ref 30.0–36.0)
MCV: 96.4 fL (ref 80.0–100.0)
Platelets: 195 10*3/uL (ref 150–400)
RBC: 4.2 MIL/uL (ref 3.87–5.11)
RDW: 11.8 % (ref 11.5–15.5)
WBC: 5 10*3/uL (ref 4.0–10.5)
nRBC: 0 % (ref 0.0–0.2)

## 2021-02-02 LAB — BASIC METABOLIC PANEL
Anion gap: 7 (ref 5–15)
BUN: 17 mg/dL (ref 8–23)
CO2: 26 mmol/L (ref 22–32)
Calcium: 8.8 mg/dL — ABNORMAL LOW (ref 8.9–10.3)
Chloride: 106 mmol/L (ref 98–111)
Creatinine, Ser: 1 mg/dL (ref 0.44–1.00)
GFR, Estimated: >60 mL/min (ref >60)
Glucose, Bld: 109 mg/dL — ABNORMAL HIGH (ref 70–99)
Potassium: 3.6 mmol/L (ref 3.5–5.1)
Sodium: 139 mmol/L (ref 135–145)

## 2021-02-02 LAB — TROPONIN I (HIGH SENSITIVITY)
Troponin I (High Sensitivity): 4 ng/L (ref <18)
Troponin I (High Sensitivity): 4 ng/L (ref <18)

## 2021-02-02 MED ORDER — ASPIRIN 81 MG PO CHEW
324.0000 mg | CHEWABLE_TABLET | Freq: Once | ORAL | Status: AC
  Administered 2021-02-02: 324 mg via ORAL
  Filled 2021-02-02: qty 4

## 2021-02-02 NOTE — ED Provider Notes (Signed)
[facility] Emergency Department Provider Note  ____________________________________________   Event Date/Time   First MD Initiated Contact with Patient 02/02/21 [phone]     (approximate)  I have reviewed the triage vital signs and the nursing notes.   HISTORY  Chief Complaint Chest Pain    HPI Brittany Hanna is a 70 y.o. female with medical history as listed below who presents for evaluation of cute onset sharp back pain that seems to radiate into her chest.  She reports that she does not have pain like this usually but it awoke her from sleep a few hours ago.  It seems to be worse when she lies down flat or is in certain positions and it gets better if she is up and moving around.  It appears to be right between her upper shoulder blades and she also feels a little bit of discomfort in her anterior chest.  The back pain was originally with a least moderate but now she can barely feel it.  She says she feels a little bit of discomfort in her left arm as well but it does not seem to be radiating from the chest or back.  She denies fever/chills, sore throat, shortness of breath, cough, nausea, vomiting, sweating, abdominal pain, and dysuria.  She is unaware of any specific trauma, but she said that she was playing with her grandson yesterday and lifting him up and over a fence.  She has no history of hypertension or diabetes.  She smokes cigarettes.  She has borderline hypercholesterolemia.  She has no first-degree relatives who have had heart attacks.  She has no history of blood clots in the legs of the lungs and has no history of cancer, recent surgeries, recent immobilizations or long trips.         Past Medical History:  Diagnosis Date  . B12 deficiency   . FHx: heart disease 10/07/2018  . High cholesterol   . Hypertension   . IBS (irritable bowel syndrome)   . Vitamin D deficiency     Patient Active Problem List   Diagnosis Date Noted  . COPD (chronic  obstructive pulmonary disease) ([place]) 01/07/2019  . OAB (overactive bladder) 10/07/2018  . Dysthymia 10/07/2018  . Gastroesophageal reflux disease 10/07/2018  . Aortic atherosclerosis (HCC) bny Abd CT scan on 10/30/2017 10/30/2017  . Smoker 10/24/2017  . Anxiety 10/24/2017  . CKD (chronic kidney disease) stage 2, GFR 60-89 ml/min 10/23/2017  . BMI 25.0-25.9,adult 10/23/2017  . Hyperlipidemia, mixed 08/08/2013  . Essential hypertension 08/08/2013  . Vitamin D deficiency 08/08/2013  . Abnormal glucose 08/08/2013    Past Surgical History:  Procedure Laterality Date  . CATARACT EXTRACTION, BILATERAL Bilateral 2019   Dr. Tommy Rainwater   . TUBAL LIGATION      Prior to Admission medications   Medication Sig Start Date End Date Taking? Authorizing Provider  ALPRAZolam Duanne Moron) 0.25 MG tablet Take  1 tablet   3 x /day  for Anxiety 11/19/20   Unk Pinto, MD  aspirin EC 81 MG tablet Take 81 mg by mouth daily. Swallow whole.    [provider]  Cholecalciferol (VITAMIN D) 125 MCG (5000 UT) CAPS Take 1 capsule by mouth 2 (two) times daily.    [provider]  omeprazole (PRILOSEC) 40 MG capsule Take 40 mg by mouth as needed.    [provider]  OXYBUTYNIN CHLORIDE PO Take 5 mg by mouth as needed.    [provider]  rosuvastatin (CRESTOR)  20 MG tablet Take 1 tablet (20 mg total) by mouth daily. 01/07/21   Unk Pinto, MD  sertraline (ZOLOFT) 100 MG tablet Take  1 tablet  Daily  for Mood 11/19/20 04/28/22  Unk Pinto, MD    Allergies Lipitor [atorvastatin]  Family History  Problem Relation Age of Onset  . Heart failure Mother   . Osteoarthritis Father   . Hypertension Father   . COPD Father     Social History Social History   Tobacco Use  . Smoking status: Current Some Day Smoker    Packs/day: 1.00    Years: 40.00    Pack years: 40.00    Types: Cigarettes  . Smokeless tobacco: Never Used  Substance Use Topics  . Alcohol use: Yes     Comment: occasionally  . Drug use: No    Review of Systems Constitutional: No fever/chills Eyes: No visual changes. ENT: No sore throat. Cardiovascular: Some upper chest discomfort that may or may not be radiating from the upper back. Respiratory: Denies shortness of breath. Gastrointestinal: No abdominal pain.  No nausea, no vomiting.  No diarrhea.  No constipation. Genitourinary: Negative for dysuria. Musculoskeletal: Some upper back pain as described above.  Small amount of left upper arm pain, now resolved. Integumentary: Negative for rash. Neurological: Negative for headaches, focal weakness or numbness.   ____________________________________________   PHYSICAL EXAM:  VITAL SIGNS: ED Triage Vitals  Enc Vitals Group     BP 02/02/21 0240 (!) 172/64     Pulse Rate 02/02/21 0240 71     Resp 02/02/21 0240 18     Temp 02/02/21 0240 98.1 F (36.7 C)     Temp Source 02/02/21 0240 Oral     SpO2 02/02/21 0240 98 %     Weight 02/02/21 0239 77.1 kg (170 lb)     Height 02/02/21 0239 1.778 m (5\' 10" )     Head Circumference --      Peak Flow --      Pain Score 02/02/21 0239 2     Pain Loc --      Pain Edu? --      Excl. in [place]? --     Constitutional: Alert and oriented.  Eyes: Conjunctivae are normal.  Head: Atraumatic. Nose: No congestion/rhinnorhea. Mouth/Throat: Patient is wearing a mask. Neck: No stridor.  No meningeal signs.   Cardiovascular: Normal rate, regular rhythm. Good peripheral circulation. Respiratory: Normal respiratory effort.  No retractions. Gastrointestinal: Soft and nontender. No distention.  Musculoskeletal: No gross deformity of her extremities.  At the base of her neck or the upper part of her shoulders, she has easily reproducible tenderness to palpation that she says it actually feels good when the muscles are palpated.  That is the area of the pain that woke her up from sleep. Neurologic:  Normal speech and language. No gross focal neurologic  deficits are appreciated.  Skin:  Skin is warm, dry and intact. Psychiatric: Mood and affect are normal. Speech and behavior are normal.  ____________________________________________   LABS (all labs ordered are listed, but only abnormal results are displayed)  Labs Reviewed  BASIC METABOLIC PANEL - Abnormal; Notable for the following components:      Result Value   Glucose, Bld 109 (*)    Calcium 8.8 (*)    All other components within normal limits  CBC - Abnormal; Notable for the following components:   MCH 34.5 (*)    All other components within normal limits  TROPONIN I (HIGH  SENSITIVITY)  TROPONIN I (HIGH SENSITIVITY)   ____________________________________________  EKG  ED ECG REPORT I, Hinda Kehr, the attending physician, personally viewed and interpreted this ECG.  Date: 02/02/2021 EKG Time: 2:44 AM Rate: 72 Rhythm: normal sinus rhythm QRS Axis: normal Intervals: Left bundle branch block ST/T Wave abnormalities: normal Narrative Interpretation: no evidence of acute ischemia.  EKG morphology is similar to one previously on record.  ____________________________________________  RADIOLOGY I, Hinda Kehr, personally viewed and evaluated these images (plain radiographs) as part of my medical decision making, as well as reviewing the written report by the radiologist.  ED MD interpretation: No acute abnormalities on chest x-ray  Official radiology report(s): DG Chest 2 View  Result Date: 02/02/2021 CLINICAL DATA:  Interscapular chest pain radiating to left arm EXAM: CHEST - 2 VIEW COMPARISON:  08/25/2009 FINDINGS: Frontal and lateral views of the chest demonstrate a stable cardiac silhouette. Mild atherosclerosis of the aortic arch. Diffuse parenchymal lung scarring and hyperinflation consistent with emphysema. 8 mm right lower lobe pulmonary nodule overlies the right anterior fourth rib on the frontal view, likely a calcified granuloma. No acute airspace disease,  effusion, or pneumothorax. No acute bony abnormalities. IMPRESSION: 1. Emphysema.  No acute airspace disease. 2.  Aortic Atherosclerosis (ICD10-I70.0). Electronically Signed   By: Randa Ngo M.D.   On: 02/02/2021 03:43    ____________________________________________   PROCEDURES   Procedure(s) performed (including Critical Care):  .1-3 Lead EKG Interpretation Performed by: Hinda Kehr, MD Authorized by: Hinda Kehr, MD     Interpretation: normal     ECG rate:  70   ECG rate assessment: normal     Rhythm: sinus rhythm     Ectopy: none     Conduction: normal       ____________________________________________   INITIAL IMPRESSION / MDM / ASSESSMENT AND PLAN / ED COURSE  As part of my medical decision making, I reviewed the following data within the [facility] notes reviewed and incorporated, Labs reviewed , EKG interpreted , Old EKG reviewed, Old chart reviewed, Radiograph reviewed  and Notes from prior ED visits   Differential diagnosis includes, but is not limited to, musculoskeletal strain, angina, ACS, PE, AAS, pneumonia, COPD.  The patient is on the cardiac monitor to evaluate for evidence of arrhythmia and/or significant heart rate changes.  Vital signs stable.  Patient has only minimal discomfort now and it is easily reproducible and localizable in her upper back and seems consistent with musculoskeletal discomfort.  No concerning EKG abnormalities suggestive of ischemia.  Initial high-sensitivity troponin is 4, basic metabolic panel is normal, CBC is normal.  Patient has no epigastric or right upper quadrant abdominal pain.  We had my usual customary angina/ACS discussion.  She does not want to stay in the hospital if it is not necessary.  Even though 1 could make an argument for this being unstable angina, I think it is unlikely; she has a low HEAR score and her symptoms are atypical and easily reproducible with palpation.  The current  plan is to repeat a troponin and to give a full dose aspirin which may also help with her discomfort.  She is comfortable with the plan for close outpatient follow-up with Dr. Humphrey Rolls and will call him in the morning for follow-up.     Clinical Course as of 02/02/21 0553  Tue Feb 02, 2021  0517 Troponin I (High Sensitivity): 4 Repeat troponin remains 4.  I will reassess the patient if she is still  essentially asymptomatic we will proceed as planned with outpatient follow-up. [CF]  [phone] Patient reports that she is asymptomatic and would like to go home.  I reiterated my usual and customary follow-up recommendations and return precautions. [CF]    Clinical Course User Index [CF] Hinda Kehr, MD     ____________________________________________  FINAL CLINICAL IMPRESSION(S) / ED DIAGNOSES  Final diagnoses:  Upper back pain  Atypical chest pain     MEDICATIONS GIVEN DURING THIS VISIT:  Medications  aspirin chewable tablet 324 mg (324 mg Oral Given 02/02/21 7001)     ED Discharge Orders    None       Note:  This document was prepared using Dragon voice recognition software and may include unintentional dictation errors.   Hinda Kehr, MD 02/02/21 [phone]

## 2021-02-02 NOTE — Discharge Instructions (Signed)
You have been seen in the Emergency Department (ED) today for chest pain and upper back pain.  As we have discussed today's test results are normal, but you may require further testing.  Please follow up with the recommended doctor as instructed above in these documents regarding today's emergent visit and your recent symptoms to discuss further management.  Continue to take your regular medications.   Return to the Emergency Department (ED) if you experience any further chest pain/pressure/tightness, difficulty breathing, or sudden sweating, or other symptoms that concern you.

## 2021-02-02 NOTE — ED Triage Notes (Signed)
Pt states she woke up tonight and had pain between her shoulder blades that radiated around to her upper chest and left arm. Pt denies dizziness or sob. Pt denies cardiac hx

## 2021-02-02 NOTE — Patient Instructions (Signed)
  Recommend bland foods/fluids, low bulk (limited fiber in short term), monitor symptoms closely for anything unusual or changes please let me know    YOU CAN CALL TO MAKE AN ULTRASOUND..  I have put in an order for an ultrasound for you to have You can set them up at your convenience by calling this number [phone] You will likely have the ultrasound at [facility]  If you have any issues call our office and we will set this up for you.      Abdominal Pain, Adult Pain in the abdomen (abdominal pain) can be caused by many things. Often, abdominal pain is not serious and it gets better with no treatment or by being treated at home. However, sometimes abdominal pain is serious. Your health care provider will ask questions about your medical history and do a physical exam to try to determine the cause of your abdominal pain. Follow these instructions at home: Medicines  Take over-the-counter and prescription medicines only as told by your health care provider.  Do not take a laxative unless told by your health care provider. General instructions  Watch your condition for any changes.  Drink enough fluid to keep your urine pale yellow.  Keep all follow-up visits as told by your health care provider. This is important.   Contact a health care provider if:  Your abdominal pain changes or gets worse.  You are not hungry or you lose weight without trying.  You are constipated or have diarrhea for more than 2-3 days.  You have pain when you urinate or have a bowel movement.  Your abdominal pain wakes you up at night.  Your pain gets worse with meals, after eating, or with certain foods.  You are vomiting and cannot keep anything down.  You have a fever.  You have blood in your urine. Get help right away if:  Your pain does not go away as soon as your health care provider told you to expect.  You cannot stop vomiting.  Your pain is only in areas of  the abdomen, such as the right side or the left lower portion of the abdomen. Pain on the right side could be caused by appendicitis.  You have bloody or black stools, or stools that look like tar.  You have severe pain, cramping, or bloating in your abdomen.  You have signs of dehydration, such as: ? Dark urine, very little urine, or no urine. ? Cracked lips. ? Dry mouth. ? Sunken eyes. ? Sleepiness. ? Weakness.  You have trouble breathing or chest pain. Summary  Often, abdominal pain is not serious and it gets better with no treatment or by being treated at home. However, sometimes abdominal pain is serious.  Watch your condition for any changes.  Take over-the-counter and prescription medicines only as told by your health care provider.  Contact a health care provider if your abdominal pain changes or gets worse.  Get help right away if you have severe pain, cramping, or bloating in your abdomen. This information is not intended to replace advice given to you by your health care provider. Make sure you discuss any questions you have with your health care provider. Document Revised: 10/11/2019 Document Reviewed: 12/31/2018 Elsevier Patient Education  [place].

## 2021-02-02 NOTE — Progress Notes (Signed)
Assessment and Plan:  Brittany Hanna was seen today for acute visit.  Diagnoses and all orders for this visit:  Acute right flank pain R sided abdominal pain with tenderness without rebound Had unremarkable cardiac workup, CXR in ED, normal CBC Fairly vague exam today with nonspecific R general abdominal tenderness - with light/loose stools ? Gallbladder or pancreas vs mild colitis with hx - no blood/mucus Check upper abdominal labs - consider [place] - has number to schedule if not improving Low suspicion of stones - negative personal hx, neg CT for stones in 2019, check UA for abnormalities Also hx of colitis - no sx suggesting need for abx, normal CBC, discussed bland foods, low bulk, bowel rest Due for colonoscopy, she will contact back when ready for referral back depending on how sx are progressing vs resolving She declined any pain medications/meds today - levsin if calls back Monitor and follow up if pain not resolving or with any new sx -     COMPLETE METABOLIC PANEL WITH GFR -     Lipase -     Urinalysis, Routine w reflex microscopic -     US Abdomen Complete; Future  Further disposition pending results of labs. Discussed med's effects and SE's.   Over 30 minutes of exam, counseling, chart review, and critical decision making was performed.   Future Appointments  Date Time Provider [place]  07/15/2021  2:30 PM Brittany Comber, NP [facility] None  01/11/2022 11:00 AM Brittany Pinto, MD [facility] None    ------------------------------------------------------------------------------------------------------------------   HPI 70 y.o.female current some day smoker, IBS/GERD recently well controlled, presents for evaluation of flank/abdominal pain.   She reports last night pain woke her up, in mid back, some radiation to chest/R flank, had workup this AM at [facility] ED, had negative EKG, CXR, serial troponins were normal, CBC was normal per their note.   She reports after  returning from home pain settled more on R flank, some radiation around to RUQ/flank with achy/crampy, reports 4-5/10, comes in waves. She has tried different positions/stretching/walking without benefit. She prefers to not take any meds.   She denies nausea, she notes stools are loose but not diarrhea, lighter color, but no mucus, denies greasy. Denies mucus or blood in stool.  She denies urinary changes/hematuria.   Rare alcohol, had 2 glasses of wine this past weekend, did have french fries the day prior. She denies NSAID use.   She had CT abd/pelvis with contrast for GI bleed/diarrhea 10/30/2017  Hepatobiliary: Scattered hypodensities throughout the liver most compatible with small cysts. No biliary ductal dilatation. Gallbladder unremarkable. Pancreas: No focal abnormality or ductal dilatation. Spleen: No focal abnormality.  Normal size. Adrenals/Urinary Tract: No adrenal abnormality. No focal renal abnormality. No stones or hydronephrosis. Urinary bladder is Unremarkable. Stomach/Bowel: Abnormal circumferential wall thickening involving the descending colon to the proximal sigmoid colon compatible with infectious or inflammatory colitis. Surrounding inflammatory stranding and a small amount of fluid in the left paracolic gutter.  Last colonoscopy 03/2010 by Dr. Fuller Plan, benign polyps, no diverticuloses, 10 year recall, overdue.   Past Medical History:  Diagnosis Date  . B12 deficiency   . FHx: heart disease 10/07/2018  . High cholesterol   . Hypertension   . IBS (irritable bowel syndrome)   . Vitamin D deficiency      Allergies  Allergen Reactions  . Lipitor [Atorvastatin] Other (See Comments)    Fatigue.    Current Outpatient Medications on File Prior to Visit  Medication Sig  . ALPRAZolam (  XANAX) 0.25 MG tablet Take  1 tablet   3 x /day  for Anxiety  . aspirin EC 81 MG tablet Take 81 mg by mouth daily. Swallow whole.  . Cholecalciferol (VITAMIN D) 125 MCG (5000 UT) CAPS  Take 1 capsule by mouth 2 (two) times daily.  Marland Kitchen omeprazole (PRILOSEC) 40 MG capsule Take 40 mg by mouth as needed.  . OXYBUTYNIN CHLORIDE PO Take 5 mg by mouth as needed.  . rosuvastatin (CRESTOR) 20 MG tablet Take 1 tablet (20 mg total) by mouth daily.  . sertraline (ZOLOFT) 100 MG tablet Take  1 tablet  Daily  for Mood   No current facility-administered medications on file prior to visit.    ROS: all negative except above.   Physical Exam:  BP 136/66   Pulse 72   Temp (!) 97.2 F (36.2 C)   Wt 171 lb (77.6 kg)   SpO2 98%   BMI 24.54 kg/m   General Appearance: Well nourished, in no apparent distress. Eyes: PERRLA, conjunctiva no swelling or erythema Sinuses: No Frontal/maxillary tenderness ENT/Mouth: Ext aud canals clear, TMs without erythema, bulging. No erythema, swelling, or exudate on post pharynx.  Tonsils not swollen or erythematous. Hearing normal.  Neck: Supple, thyroid normal.  Respiratory: Respiratory effort normal, BS equal bilaterally without rales, rhonchi, wheezing or stridor.  Cardio: RRR with no MRGs. Brisk peripheral pulses without edema.  Abdomen: Soft, + BS x 4.  Mild generalized RUQ/RLQ tenderness, non-specific, neg Murphy's, no guarding, rebound, hernias, masses. Lymphatics: Non tender without lymphadenopathy.  Musculoskeletal: No midline spinous tenderness, chest wall tenderness, muscular tenderness, normal gait.  Skin: Warm, dry without rashes, lesions, ecchymosis.  Neuro:Normal muscle tone Psych: Awake and oriented X 3, normal affect, Insight and Judgment appropriate.     Brittany Ribas, NP 11:08 AM [facility]

## 2021-02-03 LAB — COMPLETE METABOLIC PANEL WITH GFR
AG Ratio: 1.6 (calc) (ref 1.0–2.5)
ALT: 16 U/L (ref 6–29)
AST: 15 U/L (ref 10–35)
Albumin: 4.2 g/dL (ref 3.6–5.1)
Alkaline phosphatase (APISO): 47 U/L (ref 37–153)
BUN: 13 mg/dL (ref 7–25)
CO2: 27 mmol/L (ref 20–32)
Calcium: 9.4 mg/dL (ref 8.6–10.4)
Chloride: 105 mmol/L (ref 98–110)
Creat: 0.87 mg/dL (ref 0.50–0.99)
GFR, Est African American: 79 mL/min/{1.73_m2} (ref >=60)
GFR, Est Non African American: 68 mL/min/{1.73_m2} (ref >=60)
Globulin: 2.6 g/dL (calc) (ref 1.9–3.7)
Glucose, Bld: 94 mg/dL (ref 65–99)
Potassium: 4.4 mmol/L (ref 3.5–5.3)
Sodium: 141 mmol/L (ref 135–146)
Total Bilirubin: 0.6 mg/dL (ref 0.2–1.2)
Total Protein: 6.8 g/dL (ref 6.1–8.1)

## 2021-02-03 LAB — LIPASE: Lipase: 20 U/L (ref 7–60)

## 2021-02-03 LAB — URINALYSIS, ROUTINE W REFLEX MICROSCOPIC
Bacteria, UA: NONE SEEN /HPF
Bilirubin Urine: NEGATIVE
Glucose, UA: NEGATIVE
Hgb urine dipstick: NEGATIVE
Hyaline Cast: NONE SEEN /LPF
Ketones, ur: NEGATIVE
Nitrite: NEGATIVE
Protein, ur: NEGATIVE
RBC / HPF: NONE SEEN /HPF (ref 0–2)
Specific Gravity, Urine: 1.003 (ref 1.001–1.035)
Squamous Epithelial / HPF: NONE SEEN /HPF (ref <=5)
pH: 6.5 (ref 5.0–8.0)

## 2021-02-03 LAB — MICROSCOPIC MESSAGE

## 2021-03-02 ENCOUNTER — Ambulatory Visit
Admission: RE | Admit: 2021-03-02 | Discharge: 2021-03-02 | Disposition: A | Discharge status: Home / Self Care | Payer: Medicare Other | Source: Ambulatory Visit | Attending: Adult Health | Admitting: Adult Health

## 2021-03-02 DIAGNOSIS — R1011 Right upper quadrant pain: Secondary | ICD-10-CM

## 2021-03-02 DIAGNOSIS — R109 Unspecified abdominal pain: Secondary | ICD-10-CM | POA: Diagnosis not present

## 2021-07-02 DIAGNOSIS — Z23 Encounter for immunization: Secondary | ICD-10-CM | POA: Diagnosis not present

## 2021-07-15 ENCOUNTER — Ambulatory Visit: Payer: Medicare Other | Admitting: Adult Health

## 2021-07-22 DIAGNOSIS — E538 Deficiency of other specified B group vitamins: Secondary | ICD-10-CM | POA: Insufficient documentation

## 2021-07-22 NOTE — Progress Notes (Signed)
ANNUAL MEDICARE VISIT AND FOLLOW UP  Assessment:   Diagnoses and all orders for this visit:  Annual Medicare Wellness Visit Due annually  Health maintenance reviewed Mammogram and DEXA at [facility] center Schedule overdue eye exam CT lung cancer screening Colonoscopy referral   Atherosclerosis of aorta ([place]) - per CT 10/2017 Control blood pressure, cholesterol, glucose, increase exercise.   COPD ([place]) - CXR 2010 STOP SMOKING Denies sx; monitor   Essential hypertension At goal; continue current plan Monitor blood pressure at home; call if consistently over 130/80 Continue DASH diet.   Reminder to go to the ER if any CP, SOB, nausea, dizziness, severe HA, changes vision/speech, left arm numbness and tingling and jaw pain.  Abnormal glucose Recent A1Cs well controlled Discussed disease and risks Discussed diet/exercise, weight management  -     CMP WITH GFR  Medication management -     CBC with Differential/Platelet -     CMP/GFR  Mixed hyperlipidemia At goal with zetia and low dose pravastatin (hx of intolerance with higher doses) Continue to encourage low cholesterol diet, exercise, weight management -     Lipid panel -     TSH  Vitamin D deficiency Continue supplementation for goal of 60-100 Check vitamin D level biannually - has increased dose but hasn't taken this past week due to moving   CKD (chronic kidney disease), symptom management only, stage 2 (mild) Increase fluids, avoid NSAIDS, monitor sugars, will monitor -     CMP WITH GFR  BMI 25.0-25.9, adult At goal Recommended diet heavy in fruits and veggies and low in animal meats, cheeses, and dairy products, appropriate calorie intake Discussed exercise recommendations Continue to monitor at each visit  OAB Takes oxybutynin PRN and doing well   Estrogen deficiency -     DG Bone Density; Future   Smoker Intermittently smoking for an estimated 40 pack years. -lung cancer screening with low dose CT  discussed as recommended by guidelines based on age, number of pack year history.  Discussed risks of screening including but not limited to false positives on xray, further testing or consultation with specialist, and possible false negative CT as well. Patient would like to proceed with this and order placed.   Need for mammogram Ordered to be scheduled through our office due to poor patient follow through   Anxiety/dysthymia Well controlled on zoloft 50 mg daily Stress management techniques discussed, increase water, good sleep hygiene discussed, increase exercise, and increase veggies.    Over 40 minutes of exam, counseling, chart review and critical decision making was performed Future Appointments  Date Time Provider [place]  02/14/2022  3:00 PM Unk Pinto, MD [facility] None  07/26/2022 11:00 AM Liane Comber, NP [facility] None     Plan:   During the course of the visit the patient was educated and counseled about appropriate screening and preventive services including:   Pneumococcal vaccine  Prevnar 13 Influenza vaccine Td vaccine Screening electrocardiogram Bone densitometry screening Colorectal cancer screening Diabetes screening Glaucoma screening Nutrition counseling  Advanced directives: requested   Subjective:  Brittany Hanna is a 70 y.o. female who presents for Medicare Annual Wellness Visit and 3 month follow up. She has Hyperlipidemia, mixed; Essential hypertension; Vitamin D deficiency; Abnormal glucose; CKD (chronic kidney disease) stage 2, GFR 60-89 ml/min; BMI 25.0-25.9,adult; Smoker; Anxiety; Aortic atherosclerosis (HCC) bny Abd CT scan on 10/30/2017; OAB (overactive bladder); Dysthymia; Gastroesophageal reflux disease; COPD (chronic obstructive pulmonary disease) ([place]) - per CXR 2010; and B12 deficiency  on their problem list.  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   she currently continues to smoke 1 pack a day;  discussed risks associated with smoking, patient is not ready to quit. She has 40 pack/year history; low dose CT screening has been discussed and she is agreeable. Last CXR in 01/2021 with emphysematous changes. Also calcified granuloma.   She has OAB on oxybutynin PRN and does well taking on as needed, typically takes if she will be out in public for extended perids.    She has hx of depression/anxiety well controlled on zoloft 50 mg daily. Has PRN xanax, uses rarely, only every few months.   she has a diagnosis of GERD which is currently managed by omeprazole 40 mg PRN, takes occasionally only and reports well controlled sx.   BMI is Body mass index is 25.25 kg/m., she has been working on diet and exercise. Avoids meat, focuses on vegetables and fruit. Walks when weather permits.  Wt Readings from Last 3 Encounters:  07/23/21 176 lb (79.8 kg)  02/02/21 171 lb (77.6 kg)  02/02/21 170 lb (77.1 kg)   She does not check her BP at home, today their BP is BP: 124/80 She does workout. She denies chest pain, shortness of breath, dizziness.  She had negative cardiolite in 2010.   She is on cholesterol medication (rosuvastatin 20 mg daily) and denies myalgias. Severe reaction to higher dose statin in the past. Her LDL cholesterol is at goal. The cholesterol last visit was:   Lab Results  Component Value Date   CHOL 184 01/07/2021   HDL 69 01/07/2021   LDLCALC 98 01/07/2021   TRIG 78 01/07/2021   CHOLHDL 2.7 01/07/2021    She has been working on diet and exercise for glucose management, and denies increased appetite, nausea, paresthesia of the feet, polydipsia, polyuria, visual disturbances, vomiting and weight loss. Last A1C in the office was:  Lab Results  Component Value Date   HGBA1C 4.8 01/07/2021    Last GFR: Lab Results  Component Value Date   GFRNONAA 68 02/02/2021   Patient is on Vitamin D supplement but remains well below goal of 60:   Lab Results  Component Value Date    VD25OH 52 01/07/2021     She has B12 supplement, hasn't been taking.  Lab Results  Component Value Date   VITAMINB12 285 01/07/2021     Medication Review: Current Outpatient Medications on File Prior to Visit  Medication Sig Dispense Refill   ALPRAZolam (XANAX) 0.25 MG tablet Take  1 tablet   3 x /day  for Anxiety 90 tablet 0   aspirin EC 81 MG tablet Take 81 mg by mouth daily. Swallow whole.     Cholecalciferol (VITAMIN D) 125 MCG (5000 UT) CAPS Take 1 capsule by mouth 2 (two) times daily.     omeprazole (PRILOSEC) 40 MG capsule Take 40 mg by mouth as needed.     OXYBUTYNIN CHLORIDE PO Take 5 mg by mouth as needed.     rosuvastatin (CRESTOR) 20 MG tablet Take 1 tablet (20 mg total) by mouth daily. 90 tablet 0   sertraline (ZOLOFT) 100 MG tablet Take  1 tablet  Daily  for Mood 90 tablet 1   No current facility-administered medications on file prior to visit.    Allergies  Allergen Reactions   Lipitor [Atorvastatin] Other (See Comments)    Fatigue.    Current Problems (verified) Patient Active Problem List  Diagnosis Date Noted   B12 deficiency 07/22/2021   COPD (chronic obstructive pulmonary disease) ([place]) - per CXR 2010 01/07/2019   OAB (overactive bladder) 10/07/2018   Dysthymia 10/07/2018   Gastroesophageal reflux disease 10/07/2018   Aortic atherosclerosis ([place]) bny Abd CT scan on 10/30/2017 10/30/2017   Smoker 10/24/2017   Anxiety 10/24/2017   CKD (chronic kidney disease) stage 2, GFR 60-89 ml/min 10/23/2017   BMI 25.0-25.9,adult 10/23/2017   Hyperlipidemia, mixed 08/08/2013   Essential hypertension 08/08/2013   Vitamin D deficiency 08/08/2013   Abnormal glucose 08/08/2013    Screening Tests Immunization History  Administered Date(s) Administered   Fluad Quad(high Dose 65+) 07/10/2020   Influenza, High Dose Seasonal PF 06/29/2018, 06/28/2019, 07/05/2021   PFIZER(Purple Top)SARS-COV-2 Vaccination 04/16/2020, 05/07/2020, 06/11/2021   PPD Test 03/19/2014,  03/24/2015, 06/01/2016   Pneumococcal Conjugate-13 07/24/2017   Pneumococcal Polysaccharide-23 06/01/2016   Tdap 01/24/2013   Zoster, Live 11/03/2013    Preventative care: Last colonoscopy: 2011 due 2021, was called, needs to schedule, Dr. Fuller Plan. Referral placed to coordinate due to poor follow through -  Last mammogram: 2012, schedule due to poor follow through, reordered Last pap smear/pelvic exam: remote by GYN, never abnormal - DONE DEXA: will schedule  Prior vaccinations: TD or Tdap: 2014  Influenza: 2022 Pneumococcal: 2017 Prevnar13: 2018 Shingles/Zostavax: 2015 Covid 19: 2/2, 2021, just had booster 06/11/2021  Names of Other Physician/Practitioners you currently use: 1. [facility] here for primary care 2. Dr. Jefm Bryant, eye doctor, last visit 2019 3. [place], dentist, last visit 2022, goes q75m  Patient Care Team: Unk Pinto, MD as PCP - General (Internal Medicine)  SURGICAL HISTORY She  has a past surgical history that includes Tubal ligation and Cataract extraction, bilateral (Bilateral, 2019). FAMILY HISTORY Her family history includes COPD in her father; Heart failure in her mother; Hypertension in her father; Osteoarthritis in her father. SOCIAL HISTORY She  reports that she has been smoking cigarettes. She has a 40.00 pack-year smoking history. She has never used smokeless tobacco. She reports current alcohol use. She reports that she does not use drugs.   MEDICARE WELLNESS OBJECTIVES: Physical activity: Current Exercise Habits: Home exercise routine, Type of exercise: walking, Time (Minutes): 20, Frequency (Times/Week): 4, Weekly Exercise (Minutes/Week): 80, Intensity: Mild, Exercise limited by: None identified Cardiac risk factors: Cardiac Risk Factors include: advanced age (>23men, >67 women);dyslipidemia;hypertension;smoking/ tobacco exposure Depression/mood screen:   Depression screen [facility] 2/9 07/23/2021  Decreased  Interest 0  Down, Depressed, Hopeless 0  PHQ - 2 Score 0    ADLs:  In your present state of health, do you have any difficulty performing the following activities: 07/23/2021 01/06/2021  Hearing? N N  Comment has hearing aids that work well -  Vision? N N  Difficulty concentrating or making decisions? N N  Walking or climbing stairs? N N  Dressing or bathing? N N  Doing errands, shopping? N N  Some recent data might be hidden     Cognitive Testing  Alert? Yes  Normal Appearance?Yes  Oriented to person? Yes  Place? Yes   Time? Yes  Recall of three objects?  Yes  Can perform simple calculations? Yes  Displays appropriate judgment?Yes  Can read the correct time from a watch face?Yes  EOL planning: Does Patient Have a Medical Advance Directive?: No Would patient like information on creating a medical advance directive?: No - Patient declined  Review of Systems  Constitutional:  Negative for malaise/fatigue and weight loss.  HENT:  Negative for hearing loss and tinnitus.   Eyes:  Negative for blurred vision and double vision.  Respiratory:  Negative for cough, shortness of breath and wheezing.   Cardiovascular:  Negative for chest pain, palpitations, orthopnea, claudication and leg swelling.  Gastrointestinal:  Negative for abdominal pain, blood in stool, constipation, diarrhea, heartburn, melena, nausea and vomiting.  Genitourinary: Negative.   Musculoskeletal:  Negative for joint pain and myalgias.  Skin:  Negative for rash.  Neurological:  Negative for dizziness, tingling, sensory change, weakness and headaches.  Endo/Heme/Allergies:  Negative for polydipsia.  Psychiatric/Behavioral: Negative.    All other systems reviewed and are negative.   Objective:     Today's Vitals   07/23/21 1050  BP: 124/80  Pulse: 67  Temp: 97.7 F (36.5 C)  SpO2: 99%  Weight: 176 lb (79.8 kg)   Body mass index is 25.25 kg/m.  General appearance: alert, no distress, WD/WN,  female HEENT: normocephalic, sclerae anicteric, TMs pearly, nares patent, no discharge or erythema, pharynx normal Oral cavity: MMM, no lesions Neck: supple, no lymphadenopathy, no thyromegaly, no masses Heart: RRR, normal S1, S2, no murmurs Lungs: CTA bilaterally, no wheezes, rhonchi, or rales Abdomen: +bs, soft, non tender, non distended, no masses, no hepatomegaly, no splenomegaly Musculoskeletal: nontender, no swelling, no obvious deformity Extremities: no edema, no cyanosis, no clubbing Pulses: 2+ symmetric, upper and lower extremities, normal cap refill Neurological: alert, oriented x 3, CN2-12 intact, strength normal upper extremities and lower extremities, sensation normal throughout, DTRs 2+ throughout, no cerebellar signs, gait normal Psychiatric: normal affect, behavior normal, pleasant    Medicare Attestation I have personally reviewed: The patient's medical and social history Their use of alcohol, tobacco or illicit drugs Their current medications and supplements The patient's functional ability including ADLs,fall risks, home safety risks, cognitive, and hearing and visual impairment Diet and physical activities Evidence for depression or mood disorders  The patient's weight, height, BMI, and visual acuity have been recorded in the chart.  I have made referrals, counseling, and provided education to the patient based on review of the above and I have provided the patient with a written personalized care plan for preventive services.     Izora Ribas, NP   07/23/2021

## 2021-07-23 ENCOUNTER — Other Ambulatory Visit: Payer: Self-pay

## 2021-07-23 ENCOUNTER — Ambulatory Visit (INDEPENDENT_AMBULATORY_CARE_PROVIDER_SITE_OTHER): Payer: Medicare Other | Admitting: Adult Health

## 2021-07-23 ENCOUNTER — Encounter: Payer: Self-pay | Admitting: Adult Health

## 2021-07-23 VITALS — BP 124/80 | HR 67 | Temp 97.7°F | Wt 176.0 lb

## 2021-07-23 DIAGNOSIS — Z87891 Personal history of nicotine dependence: Secondary | ICD-10-CM

## 2021-07-23 DIAGNOSIS — Z1231 Encounter for screening mammogram for malignant neoplasm of breast: Secondary | ICD-10-CM

## 2021-07-23 DIAGNOSIS — N3281 Overactive bladder: Secondary | ICD-10-CM | POA: Diagnosis not present

## 2021-07-23 DIAGNOSIS — K219 Gastro-esophageal reflux disease without esophagitis: Secondary | ICD-10-CM | POA: Diagnosis not present

## 2021-07-23 DIAGNOSIS — R6889 Other general symptoms and signs: Secondary | ICD-10-CM | POA: Diagnosis not present

## 2021-07-23 DIAGNOSIS — N182 Chronic kidney disease, stage 2 (mild): Secondary | ICD-10-CM

## 2021-07-23 DIAGNOSIS — E559 Vitamin D deficiency, unspecified: Secondary | ICD-10-CM | POA: Diagnosis not present

## 2021-07-23 DIAGNOSIS — J42 Unspecified chronic bronchitis: Secondary | ICD-10-CM

## 2021-07-23 DIAGNOSIS — Z0000 Encounter for general adult medical examination without abnormal findings: Secondary | ICD-10-CM

## 2021-07-23 DIAGNOSIS — E538 Deficiency of other specified B group vitamins: Secondary | ICD-10-CM

## 2021-07-23 DIAGNOSIS — Z1211 Encounter for screening for malignant neoplasm of colon: Secondary | ICD-10-CM

## 2021-07-23 DIAGNOSIS — F341 Dysthymic disorder: Secondary | ICD-10-CM

## 2021-07-23 DIAGNOSIS — R7309 Other abnormal glucose: Secondary | ICD-10-CM | POA: Diagnosis not present

## 2021-07-23 DIAGNOSIS — Z6825 Body mass index (BMI) 25.0-25.9, adult: Secondary | ICD-10-CM | POA: Diagnosis not present

## 2021-07-23 DIAGNOSIS — F419 Anxiety disorder, unspecified: Secondary | ICD-10-CM | POA: Diagnosis not present

## 2021-07-23 DIAGNOSIS — I10 Essential (primary) hypertension: Secondary | ICD-10-CM | POA: Diagnosis not present

## 2021-07-23 DIAGNOSIS — E782 Mixed hyperlipidemia: Secondary | ICD-10-CM

## 2021-07-23 DIAGNOSIS — E2839 Other primary ovarian failure: Secondary | ICD-10-CM

## 2021-07-23 DIAGNOSIS — I700 Atherosclerosis of aorta: Secondary | ICD-10-CM

## 2021-07-23 DIAGNOSIS — Z0001 Encounter for general adult medical examination with abnormal findings: Secondary | ICD-10-CM

## 2021-07-23 DIAGNOSIS — F17200 Nicotine dependence, unspecified, uncomplicated: Secondary | ICD-10-CM

## 2021-07-23 NOTE — Patient Instructions (Addendum)
Please schedule follow up with Dr. Jefm Bryant   HOW TO SCHEDULE A MAMMOGRAM AND BONE DENSTIY  [facility]  7 a.m.-6:30 p.m., Monday 7 a.m.-5 p.m., Tuesday-Friday Schedule an appointment by calling [phone].     Lung Cancer Screening A lung cancer screening is a test that checks for lung cancer when there are no symptoms or history of that disease. The screening is done to look for lung cancer in its very early stages. Finding cancer early improves the chances of successful treatment. It may save your life. Who should have a screening? You should be screened for lung cancer if all of these apply: You currently smoke, or you have quit smoking within the past 15 years. You are between the ages of 36 and 39 years old. Screening may be recommended up to age 20 depending on your overall health and other factors. You have a smoking history of 1 pack of cigarettes a day for 20 years or 2 packs a day for 10 years. How is screening done? The recommended screening test is a low-dose computed tomography (LDCT) scan. This scan takes detailed images of the lungs. This allows a health care provider to look for abnormal cells. If you are at risk for lung cancer, it is recommended that you get screened once a year. Talk to your health care provider about the risks, benefits, and limitations of screening. What are the benefits of screening? Screening can find lung cancer early, before symptoms start and before it has spread outside of the lungs. The chances of curing lung cancer are greater if the cancer is diagnosed early. What are the risks of screening? The screening may show lung cancer when no cancer is present. Talk with your health care provider about what your results mean. In some cases, your health care provider may do more testing to confirm the results. The screening may not find lung cancer when it is present. You will be exposed to radiation from repeated LDCT  tests, which can cause cancer in otherwise healthy people. How can I lower my risk of lung cancer? Make these lifestyle changes to lower your risk of developing lung cancer: Do not use any products that contain nicotine or tobacco. These products include cigarettes, chewing tobacco, and vaping devices, such as e-cigarettes. If you need help quitting, ask your health care provider. Avoid secondhand smoke. Avoid exposure to radiation. Avoid exposure to radon gas. Have your home checked for radon regularly. Avoid things that cause cancer (carcinogens). Avoid living or working in places with high air pollution or diesel exhaust. Questions to ask your health care provider Am I eligible for lung cancer screening? Does my health insurance cover the cost of lung cancer screening? What happens if the lung cancer screening shows something of concern? How soon will I have results from my lung cancer screening? Is there anything that I need to do to prepare for my lung cancer screening? What happens if I decide not to have lung cancer screening? Where to find more information Ask your health care provider about the risks and benefits of screening. More information and resources are available from these organizations: [place] (ACS): www.cancer.org American Lung Association: www.lung.[place]: www.cancer.gov Contact a health care provider if: You start to show symptoms of lung cancer, including: A cough that will not go away. High-pitched whistling sounds when you breathe, most often when you breathe out (wheezing). Chest pain. Coughing up blood. Shortness of breath.  Weight loss that cannot be explained. Constant tiredness (fatigue). Hoarse voice. Summary Lung cancer screening may find lung cancer before symptoms appear. Finding cancer early improves the chances of successful treatment. It may save your life. The recommended screening test is a low-dose  computed tomography (LDCT) scan that looks for abnormal cells in the lungs. If you are at risk for lung cancer, it is recommended that you get screened once a year. You can make lifestyle changes to lower your risk of lung cancer. Ask your health care provider about the risks and benefits of screening. This information is not intended to replace advice given to you by your health care provider. Make sure you discuss any questions you have with your health care provider. Document Revised: 02/10/2021 Document Reviewed: 02/10/2021 Elsevier Patient Education  2022 [place]  [phone] for more information or for a free program for smoking cessation help.   You can call QUIT SMART 1-800-QUIT-NOW for free nicotine patches or replacement therapy- if they are out- keep calling  [place] [facility] Can call for smoking cessation classes, (352)582-0954  If you have a smart phone, please look up Smoke Free app, this will help you stay on track and give you information about money you have saved, life that you have gained back and a ton of more information.     ADVANTAGES OF QUITTING SMOKING Within 20 minutes, blood pressure decreases. Your pulse is at normal level. After 8 hours, carbon monoxide levels in the blood return to normal. Your oxygen level increases. After 24 hours, the chance of having a heart attack starts to decrease. Your breath, hair, and body stop smelling like smoke. After 48 hours, damaged nerve endings begin to recover. Your sense of taste and smell improve. After 72 hours, the body is virtually free of nicotine. Your bronchial tubes relax and breathing becomes easier. After 2 to 12 weeks, lungs can hold more air. Exercise becomes easier and circulation improves. After 1 year, the risk of coronary heart disease is cut in half. After 5 years, the risk of stroke falls to the same as a nonsmoker. After 10 years, the  risk of lung cancer is cut in half and the risk of other cancers decreases significantly. After 15 years, the risk of coronary heart disease drops, usually to the level of a nonsmoker. You will have extra money to spend on things other than cigarettes.

## 2021-07-24 ENCOUNTER — Other Ambulatory Visit: Payer: Self-pay | Admitting: Adult Health

## 2021-07-24 LAB — CBC WITH DIFFERENTIAL/PLATELET
Absolute Monocytes: 492 cells/uL (ref 200–950)
Basophils Absolute: 32 cells/uL (ref 0–200)
Basophils Relative: 0.7 %
Eosinophils Absolute: 120 cells/uL (ref 15–500)
Eosinophils Relative: 2.6 %
HCT: 42.7 % (ref 35.0–45.0)
Hemoglobin: 14.5 g/dL (ref 11.7–15.5)
Lymphs Abs: 1283 cells/uL (ref 850–3900)
MCH: 33.6 pg — ABNORMAL HIGH (ref 27.0–33.0)
MCHC: 34 g/dL (ref 32.0–36.0)
MCV: 98.8 fL (ref 80.0–100.0)
MPV: 10.7 fL (ref 7.5–12.5)
Monocytes Relative: 10.7 %
Neutro Abs: 2673 cells/uL (ref 1500–7800)
Neutrophils Relative %: 58.1 %
Platelets: 216 10*3/uL (ref 140–400)
RBC: 4.32 10*6/uL (ref 3.80–5.10)
RDW: 11.8 % (ref 11.0–15.0)
Total Lymphocyte: 27.9 %
WBC: 4.6 10*3/uL (ref 3.8–10.8)

## 2021-07-24 LAB — COMPLETE METABOLIC PANEL WITH GFR
AG Ratio: 1.5 (calc) (ref 1.0–2.5)
ALT: 15 U/L (ref 6–29)
AST: 17 U/L (ref 10–35)
Albumin: 4 g/dL (ref 3.6–5.1)
Alkaline phosphatase (APISO): 50 U/L (ref 37–153)
BUN: 13 mg/dL (ref 7–25)
CO2: 29 mmol/L (ref 20–32)
Calcium: 9.4 mg/dL (ref 8.6–10.4)
Chloride: 105 mmol/L (ref 98–110)
Creat: 0.96 mg/dL (ref 0.60–1.00)
Globulin: 2.6 g/dL (calc) (ref 1.9–3.7)
Glucose, Bld: 87 mg/dL (ref 65–99)
Potassium: 4.4 mmol/L (ref 3.5–5.3)
Sodium: 141 mmol/L (ref 135–146)
Total Bilirubin: 0.7 mg/dL (ref 0.2–1.2)
Total Protein: 6.6 g/dL (ref 6.1–8.1)
eGFR: 64 mL/min/{1.73_m2} (ref >=60)

## 2021-07-24 LAB — LIPID PANEL
Cholesterol: 189 mg/dL (ref <200)
HDL: 67 mg/dL (ref >=50)
LDL Cholesterol (Calc): 106 mg/dL (calc) — ABNORMAL HIGH
Non-HDL Cholesterol (Calc): 122 mg/dL (calc) (ref <130)
Total CHOL/HDL Ratio: 2.8 (calc) (ref <5.0)
Triglycerides: 69 mg/dL (ref <150)

## 2021-07-24 LAB — TSH: TSH: 0.8 mIU/L (ref 0.40–4.50)

## 2021-07-24 LAB — MAGNESIUM: Magnesium: 2.2 mg/dL (ref 1.5–2.5)

## 2021-07-24 MED ORDER — EZETIMIBE 10 MG PO TABS: ORAL_TABLET | ORAL | 3 refills | Status: DC

## 2021-08-13 ENCOUNTER — Other Ambulatory Visit: Payer: Self-pay

## 2021-08-13 ENCOUNTER — Ambulatory Visit
Admission: RE | Admit: 2021-08-13 | Discharge: 2021-08-13 | Disposition: A | Discharge status: Home / Self Care | Payer: Medicare Other | Source: Ambulatory Visit | Attending: Adult Health | Admitting: Adult Health

## 2021-08-13 DIAGNOSIS — Z87891 Personal history of nicotine dependence: Secondary | ICD-10-CM

## 2021-08-13 DIAGNOSIS — J432 Centrilobular emphysema: Secondary | ICD-10-CM | POA: Diagnosis not present

## 2021-08-13 DIAGNOSIS — F17200 Nicotine dependence, unspecified, uncomplicated: Secondary | ICD-10-CM

## 2021-08-13 DIAGNOSIS — F17210 Nicotine dependence, cigarettes, uncomplicated: Secondary | ICD-10-CM | POA: Diagnosis not present

## 2021-08-13 DIAGNOSIS — K449 Diaphragmatic hernia without obstruction or gangrene: Secondary | ICD-10-CM | POA: Diagnosis not present

## 2021-08-13 DIAGNOSIS — I2510 Atherosclerotic heart disease of native coronary artery without angina pectoris: Secondary | ICD-10-CM | POA: Diagnosis not present

## 2021-09-17 ENCOUNTER — Ambulatory Visit
Admission: RE | Admit: 2021-09-17 | Discharge: 2021-09-17 | Disposition: A | Discharge status: Home / Self Care | Payer: Medicare Other | Source: Ambulatory Visit | Attending: Adult Health | Admitting: Adult Health

## 2021-09-17 DIAGNOSIS — Z1231 Encounter for screening mammogram for malignant neoplasm of breast: Secondary | ICD-10-CM | POA: Diagnosis not present

## 2021-12-03 ENCOUNTER — Encounter: Payer: Self-pay | Admitting: Gastroenterology

## 2022-01-07 ENCOUNTER — Encounter: Payer: Self-pay | Admitting: Adult Health

## 2022-01-07 ENCOUNTER — Other Ambulatory Visit: Payer: Self-pay | Admitting: Adult Health

## 2022-01-07 ENCOUNTER — Ambulatory Visit
Admission: RE | Admit: 2022-01-07 | Discharge: 2022-01-07 | Disposition: A | Discharge status: Home / Self Care | Payer: Medicare Other | Source: Ambulatory Visit | Attending: Adult Health | Admitting: Adult Health

## 2022-01-07 DIAGNOSIS — M85852 Other specified disorders of bone density and structure, left thigh: Secondary | ICD-10-CM | POA: Diagnosis not present

## 2022-01-07 DIAGNOSIS — E2839 Other primary ovarian failure: Secondary | ICD-10-CM

## 2022-01-07 DIAGNOSIS — Z780 Asymptomatic menopausal state: Secondary | ICD-10-CM | POA: Diagnosis not present

## 2022-01-07 DIAGNOSIS — M810 Age-related osteoporosis without current pathological fracture: Secondary | ICD-10-CM

## 2022-01-07 MED ORDER — ALENDRONATE SODIUM 70 MG PO TABS: 70.0000 mg | ORAL_TABLET | ORAL | 3 refills | Status: DC

## 2022-01-11 ENCOUNTER — Encounter: Payer: Medicare Other | Admitting: Internal Medicine

## 2022-02-07 ENCOUNTER — Ambulatory Visit (AMBULATORY_SURGERY_CENTER): Payer: Medicare Other | Admitting: *Deleted

## 2022-02-07 VITALS — Ht 69.5 in | Wt 172.0 lb

## 2022-02-07 DIAGNOSIS — Z1211 Encounter for screening for malignant neoplasm of colon: Secondary | ICD-10-CM

## 2022-02-07 MED ORDER — NA SULFATE-K SULFATE-MG SULF 17.5-3.13-1.6 GM/177ML PO SOLN: 1.0000 | ORAL | 0 refills | Status: DC

## 2022-02-07 NOTE — Progress Notes (Signed)
Patient's pre-visit was done today over the phone with the patient. Name,DOB and address verified. Patient denies any allergies to Eggs and Soy. Patient denies any problems with anesthesia/sedation. Patient is not taking any diet pills or blood thinners. No home Oxygen. Insurance confirmed with patient. Patient does not have a BM every day-she will use miralax daily with suprep. Patient aware to use [facility] rx w/suprep rx.  Prep instructions sent to pt's MyChart -pt is aware. Patient understands to call us back with any questions or concerns. Patient is aware of our care-partner policy.   EMMI education assigned to the patient for the procedure, sent to [employer].

## 2022-02-13 ENCOUNTER — Encounter: Payer: Self-pay | Admitting: Internal Medicine

## 2022-02-13 NOTE — Progress Notes (Unsigned)
Comprehensive Evaluation &  Examination  Future Appointments  Date Time Provider Department  02/14/2022                 CPE  3:00 PM Unk Pinto, MD [facility]  02/21/2022 10:30 AM Ladene Artist, MD [facility]  07/26/2022               Wellness 10:00 AM Darrol Jump, NP [facility]  02/16/2023                 CPE  3:00 PM Unk Pinto, MD [facility]        This very nice 71 y.o.  [facility] presents for a  comprehensive evaluation and management of multiple medical co-morbidities.  Patient has been followed for HTN, HLD, Prediabetes  and Vitamin D Deficiency.   Abd CT scan  in 2019 did show Aortic Atherosclerosis. Patient has hx/o a low Vit B12 level of "226" in the past .                                                   Patient has COPD consequent of a smoking hx/o 1 ppd x 40 + years. Due to  her long history of smoking over 40+ years,  I discussed lung cancer screening with her. She had a NEGATIVE LD Screening CT scan on 08/13/21 and Radiologist recommended continual F/U. Sheis  agreeable to undergo a screening low dose CT scan of the chest in Dec .  We discussed smoking cessation techniques/options. I will refer him her for a LDCT lung scan          In March 2022, patient indicated that she had stopped virtually all of her meds in response to the untimely death of a  son in [place]  05-18-20 with Covid PNA .        Labile HTN predates circa 2005.  In 2015, patient had a negative Cardiolite.   Patient's BP has been controlled at home and patient denies any cardiac symptoms as chest pain, palpitations, shortness of breath, dizziness or ankle swelling. Today's BP is                          .       Patient's hyperlipidemia is not controlled with diet .   Lab Results  Component Value Date   CHOL 189 07/23/2021   HDL 67 07/23/2021   LDLCALC 106 (H) 07/23/2021   TRIG 69 07/23/2021   CHOLHDL 2.8 07/23/2021         Patient has been monitored expectantly for glucose intolerance  and in 2017 had an elevated insulin level of 27+.    Patient denies reactive hypoglycemic symptoms, visual blurring, diabetic polys or paresthesias. Last A1c was normal & at goal:  Lab Results  Component Value Date   HGBA1C 4.5 01/08/2020        Finally, patient has history of Vitamin D Deficiency ("10" /2008) and last Vitamin D was still low (goal 70-100):  Lab Results  Component Value Date   VD25OH 40 01/08/2020     Current Outpatient Medications on File Prior to Visit  Medication Sig   ALPRAZolam (XANAX) 0.25 MG tablet Take  1 tablet   3 x /day  for Anxiety   sertraline (ZOLOFT) 100 MG tablet  Take  1 tablet  Daily  for Mood    Allergies  Allergen Reactions   Lipitor [Atorvastatin] Fatigue.    Past Medical History:  Diagnosis Date   B12 deficiency    FHx: heart disease 10/07/2018   High cholesterol    Hypertension    IBS (irritable bowel syndrome)    Vitamin D deficiency     Health Maintenance  Topic Date Due   MAMMOGRAM  01/25/2013   DEXA SCAN  Never done   COLONOSCOPY  03/30/2020   COVID-19 Vaccine (2 - Pfizer 3-dose series) 05/07/2020   INFLUENZA VACCINE  04/05/2021   PNA vac Low Risk Adult (2 of 2 - PPSV23) 06/01/2021   TETANUS/TDAP  01/25/2023   Hepatitis C Screening  Completed   HPV VACCINES  Aged Out    Immunization History  Administered Date(s) Administered   Fluad Quad(high Dose 65+) 07/10/2020   Influenza, High Dose   06/29/2018, 06/28/2019   PFIZER  SARS-COV-2 Vacci  04/16/2020   PPD Test 03/19/2014, 03/24/2015, 06/01/2016   Pneumococcal -13 07/24/2017   Pneumococcal  -23 06/01/2016   Tdap 01/24/2013   Zoster 11/03/2013    Last Colon - 03/30/2010 - Dr Fuller Plan - recc 10 yr f/u Colon - Scheduled for 02/21/2022  Last MGM - 09/17/2021  Last dexaBMD  - 01/07/2022  Past Surgical History:  Procedure Laterality Date   CATARACT EXTRACTION, BILATERAL Bilateral 2019   Dr. Tommy Rainwater    TUBAL LIGATION       Family History  Problem Relation Age of  Onset   Heart failure Mother    Osteoarthritis Father    Hypertension Father    COPD Father      Social History   Tobacco Use   Smoking status: Current Some Day Smoker    Packs/day: 1.00    Years: 40.00    Pack years: 40.00    Types: Cigarettes   Smokeless tobacco: Never Used  Substance Use Topics   Alcohol use: Yes    Comment: occasionally   Drug use: No      ROS Constitutional: Denies fever, chills, weight loss/gain, headaches, insomnia,  night sweats, and change in appetite. Does c/o fatigue. Eyes: Denies redness, blurred vision, diplopia, discharge, itchy, watery eyes.  ENT: Denies discharge, congestion, post nasal drip, epistaxis, sore throat, earache, hearing loss, dental pain, Tinnitus, Vertigo, Sinus pain, snoring.  Cardio: Denies chest pain, palpitations, irregular heartbeat, syncope, dyspnea, diaphoresis, orthopnea, PND, claudication, edema Respiratory: denies cough, dyspnea, DOE, pleurisy, hoarseness, laryngitis, wheezing.  Gastrointestinal: Denies dysphagia, heartburn, reflux, water brash, pain, cramps, nausea, vomiting, bloating, diarrhea, constipation, hematemesis, melena, hematochezia, jaundice, hemorrhoids Genitourinary: Denies dysuria, frequency, urgency, nocturia, hesitancy, discharge, hematuria, flank pain Breast: Breast lumps, nipple discharge, bleeding.  Musculoskeletal: Denies arthralgia, myalgia, stiffness, Jt. Swelling, pain, limp, and strain/sprain. Denies falls. Skin: Denies puritis, rash, hives, warts, acne, eczema, changing in skin lesion Neuro: No weakness, tremor, incoordination, spasms, paresthesia, pain Psychiatric: Denies confusion, memory loss, sensory loss. Denies Depression. Endocrine: Denies change in weight, skin, hair change, nocturia, and paresthesia, diabetic polys, visual blurring, hyper / hypo glycemic episodes.  Heme/Lymph: No excessive bleeding, bruising, enlarged lymph nodes.  Physical Exam  There were no vitals taken for this  visit.  General Appearance: Well nourished, well groomed and in no apparent distress.  Eyes: PERRLA, EOMs, conjunctiva no swelling or erythema, normal fundi and vessels. Sinuses: No frontal/maxillary tenderness ENT/Mouth: EACs patent / TMs  nl. Nares clear without erythema, swelling, mucoid exudates. Oral hygiene is good. No  erythema, swelling, or exudate. Tongue normal, non-obstructing. Tonsils not swollen or erythematous. Hearing normal.  Neck: Supple, thyroid not palpable. No bruits, nodes or JVD. Respiratory: Respiratory effort normal.  BS equal and clear bilateral without rales, rhonci, wheezing or stridor. Cardio: Heart sounds are normal with regular rate and rhythm and no murmurs, rubs or gallops. Peripheral pulses are normal and equal bilaterally without edema. No aortic or femoral bruits. Chest: symmetric with normal excursions and percussion. Breasts: Symmetric, without lumps, nipple discharge, retractions, or fibrocystic changes.  Abdomen: Flat, soft with bowel sounds active. Nontender, no guarding, rebound, hernias, masses, or organomegaly.  Lymphatics: Non tender without lymphadenopathy.  Musculoskeletal: Full ROM all peripheral extremities, joint stability, 5/5 strength, and normal gait. Skin: Warm and dry without rashes, lesions, cyanosis, clubbing or  ecchymosis.  Neuro: Cranial nerves intact, reflexes equal bilaterally. Normal muscle tone, no cerebellar symptoms. Sensation intact.  Pysch: Alert and oriented X 3, normal affect, Insight and Judgment appropriate.    Assessment and Plan  1. Essential hypertension  - EKG 12-Lead - Urinalysis, Routine w reflex microscopic - Microalbumin / creatinine urine ratio - CBC with Differential/Platelet - COMPLETE METABOLIC PANEL WITH GFR - Magnesium - TSH  2. Hyperlipidemia, mixed  - EKG 12-Lead - Lipid panel - TSH  3. Abnormal glucose  - EKG 12-Lead - Hemoglobin A1c - Insulin, random  4. Vitamin D deficiency  -  VITAMIN D 25 Hydroxy   5. Screening for colorectal cancer  - POC Hemoccult Bld/Stl  6. Screening for ischemic heart disease  - EKG 12-Lead  7. CKD (chronic kidney disease) stage 2, GFR 60-89 ml/min  - Urinalysis, Routine w reflex microscopic - COMPLETE METABOLIC PANEL WITH GFR  8. Vitamin B12 deficiency  - Vitamin B12  9. Chronic bronchitis ([place])   10. Aortic atherosclerosis (H[facility]) bny Abd CT scan on 10/30/2017  - EKG 12-Lead  11. FHx: heart disease  - EKG 12-Lead  12. Smoker  - EKG 12-Lead  13. Medication management  - Urinalysis, Routine w reflex microscopic - Microalbumin / creatinine urine ratio - CBC with Differential/Platelet - COMPLETE METABOLIC PANEL WITH GFR - Magnesium - Lipid panel - TSH - Hemoglobin A1c - Insulin, random - VITAMIN D 25 Hydroxy         Patient was counseled in prudent diet to achieve/maintain BMI less than 25 for weight control, BP monitoring, regular exercise and medications. Discussed med's effects and SE's. Screening labs and tests as requested with regular follow-up as recommended. Over 40 minutes of exam, counseling, chart review and high complex critical decision making was performed.   Kirtland Bouchard, MD

## 2022-02-13 NOTE — Patient Instructions (Signed)

## 2022-02-14 ENCOUNTER — Ambulatory Visit (INDEPENDENT_AMBULATORY_CARE_PROVIDER_SITE_OTHER): Payer: Medicare Other | Admitting: Internal Medicine

## 2022-02-14 ENCOUNTER — Encounter: Payer: Self-pay | Admitting: Internal Medicine

## 2022-02-14 VITALS — BP 122/78 | HR 100 | Temp 97.9°F | Resp 16 | Ht 70.0 in | Wt 176.2 lb

## 2022-02-14 DIAGNOSIS — Z122 Encounter for screening for malignant neoplasm of respiratory organs: Secondary | ICD-10-CM

## 2022-02-14 DIAGNOSIS — E559 Vitamin D deficiency, unspecified: Secondary | ICD-10-CM | POA: Diagnosis not present

## 2022-02-14 DIAGNOSIS — Z79899 Other long term (current) drug therapy: Secondary | ICD-10-CM | POA: Diagnosis not present

## 2022-02-14 DIAGNOSIS — R7309 Other abnormal glucose: Secondary | ICD-10-CM

## 2022-02-14 DIAGNOSIS — F17200 Nicotine dependence, unspecified, uncomplicated: Secondary | ICD-10-CM | POA: Diagnosis not present

## 2022-02-14 DIAGNOSIS — Z136 Encounter for screening for cardiovascular disorders: Secondary | ICD-10-CM

## 2022-02-14 DIAGNOSIS — I700 Atherosclerosis of aorta: Secondary | ICD-10-CM | POA: Diagnosis not present

## 2022-02-14 DIAGNOSIS — I447 Left bundle-branch block, unspecified: Secondary | ICD-10-CM | POA: Diagnosis not present

## 2022-02-14 DIAGNOSIS — R0989 Other specified symptoms and signs involving the circulatory and respiratory systems: Secondary | ICD-10-CM

## 2022-02-14 DIAGNOSIS — E538 Deficiency of other specified B group vitamins: Secondary | ICD-10-CM

## 2022-02-14 DIAGNOSIS — E782 Mixed hyperlipidemia: Secondary | ICD-10-CM

## 2022-02-14 DIAGNOSIS — Z8249 Family history of ischemic heart disease and other diseases of the circulatory system: Secondary | ICD-10-CM | POA: Diagnosis not present

## 2022-02-15 ENCOUNTER — Other Ambulatory Visit: Payer: Self-pay | Admitting: Internal Medicine

## 2022-02-15 DIAGNOSIS — E782 Mixed hyperlipidemia: Secondary | ICD-10-CM

## 2022-02-15 DIAGNOSIS — I700 Atherosclerosis of aorta: Secondary | ICD-10-CM

## 2022-02-15 LAB — TSH: TSH: 0.76 mIU/L (ref 0.40–4.50)

## 2022-02-15 LAB — INSULIN, RANDOM: Insulin: 15.5 u[IU]/mL

## 2022-02-15 LAB — COMPLETE METABOLIC PANEL WITH GFR
AG Ratio: 1.7 (calc) (ref 1.0–2.5)
ALT: 9 U/L (ref 6–29)
AST: 15 U/L (ref 10–35)
Albumin: 4 g/dL (ref 3.6–5.1)
Alkaline phosphatase (APISO): 50 U/L (ref 37–153)
BUN: 12 mg/dL (ref 7–25)
CO2: 28 mmol/L (ref 20–32)
Calcium: 9.3 mg/dL (ref 8.6–10.4)
Chloride: 103 mmol/L (ref 98–110)
Creat: 0.91 mg/dL (ref 0.60–1.00)
Globulin: 2.4 g/dL (calc) (ref 1.9–3.7)
Glucose, Bld: 87 mg/dL (ref 65–99)
Potassium: 4.1 mmol/L (ref 3.5–5.3)
Sodium: 138 mmol/L (ref 135–146)
Total Bilirubin: 0.6 mg/dL (ref 0.2–1.2)
Total Protein: 6.4 g/dL (ref 6.1–8.1)
eGFR: 68 mL/min/{1.73_m2} (ref >=60)

## 2022-02-15 LAB — CBC WITH DIFFERENTIAL/PLATELET
Absolute Monocytes: 514 cells/uL (ref 200–950)
Basophils Absolute: 32 cells/uL (ref 0–200)
Basophils Relative: 0.6 %
Eosinophils Absolute: 80 cells/uL (ref 15–500)
Eosinophils Relative: 1.5 %
HCT: 42.4 % (ref 35.0–45.0)
Hemoglobin: 14.7 g/dL (ref 11.7–15.5)
Lymphs Abs: 1632 cells/uL (ref 850–3900)
MCH: 34.6 pg — ABNORMAL HIGH (ref 27.0–33.0)
MCHC: 34.7 g/dL (ref 32.0–36.0)
MCV: 99.8 fL (ref 80.0–100.0)
MPV: 10.5 fL (ref 7.5–12.5)
Monocytes Relative: 9.7 %
Neutro Abs: 3042 cells/uL (ref 1500–7800)
Neutrophils Relative %: 57.4 %
Platelets: 219 10*3/uL (ref 140–400)
RBC: 4.25 10*6/uL (ref 3.80–5.10)
RDW: 12 % (ref 11.0–15.0)
Total Lymphocyte: 30.8 %
WBC: 5.3 10*3/uL (ref 3.8–10.8)

## 2022-02-15 LAB — URINALYSIS, ROUTINE W REFLEX MICROSCOPIC
Bacteria, UA: NONE SEEN /HPF
Bilirubin Urine: NEGATIVE
Glucose, UA: NEGATIVE
Hgb urine dipstick: NEGATIVE
Hyaline Cast: NONE SEEN /LPF
Ketones, ur: NEGATIVE
Nitrite: NEGATIVE
Protein, ur: NEGATIVE
RBC / HPF: NONE SEEN /HPF (ref 0–2)
Specific Gravity, Urine: 1.004 (ref 1.001–1.035)
Squamous Epithelial / HPF: NONE SEEN /HPF (ref <=5)
pH: 6.5 (ref 5.0–8.0)

## 2022-02-15 LAB — LIPID PANEL
Cholesterol: 236 mg/dL — ABNORMAL HIGH (ref <200)
HDL: 64 mg/dL (ref >=50)
LDL Cholesterol (Calc): 146 mg/dL (calc) — ABNORMAL HIGH
Non-HDL Cholesterol (Calc): 172 mg/dL (calc) — ABNORMAL HIGH (ref <130)
Total CHOL/HDL Ratio: 3.7 (calc) (ref <5.0)
Triglycerides: 135 mg/dL (ref <150)

## 2022-02-15 LAB — MICROSCOPIC MESSAGE

## 2022-02-15 LAB — VITAMIN D 25 HYDROXY (VIT D DEFICIENCY, FRACTURES): Vit D, 25-Hydroxy: 32 ng/mL (ref 30–100)

## 2022-02-15 LAB — MICROALBUMIN / CREATININE URINE RATIO
Creatinine, Urine: 27 mg/dL (ref 20–275)
Microalb, Ur: <0.2 mg/dL

## 2022-02-15 LAB — HEMOGLOBIN A1C
Hgb A1c MFr Bld: 4.8 % of total Hgb (ref <5.7)
Mean Plasma Glucose: 91 mg/dL
eAG (mmol/L): 5 mmol/L

## 2022-02-15 LAB — VITAMIN B12: Vitamin B-12: 197 pg/mL — ABNORMAL LOW (ref 200–1100)

## 2022-02-15 LAB — MAGNESIUM: Magnesium: 2 mg/dL (ref 1.5–2.5)

## 2022-02-15 MED ORDER — ROSUVASTATIN CALCIUM 10 MG PO TABS: ORAL_TABLET | ORAL | 3 refills | Status: DC

## 2022-02-15 MED ORDER — EZETIMIBE 10 MG PO TABS: ORAL_TABLET | ORAL | 3 refills | Status: DC

## 2022-02-15 NOTE — Progress Notes (Signed)
<><><><><><><><><><><><><><><><><><><><><><><><><><><><><><><><><> <><><><><><><><><><><><><><><><><><><><><><><><><><><><><><><><><> - Test results slightly outside the reference range are not unusual. If there is anything important, I will review this with you,  otherwise it is considered normal test values.  If you have further questions,  please do not hesitate to contact me at the office or via My Chart.  <><><><><><><><><><><><><><><><><><><><><><><><><><><><><><><><><> <><><><><><><><><><><><><><><><><><><><><><><><><><><><><><><><><>  -   -  Vitamin B12 =197  EXTREMELY  Low  !  !  !                              (Ideal or Goal Vit B12 is between 450 - 1,100)   Low Vit B12 may be associated with Anemia , Fatigue,   Peripheral Neuropathy, Dementia, "Brain Fog", & Depression  - Recommend take a sub-lingual form of Vitamin B12 tablet   1,000 to 5,000 mcg tab that you dissolve under your tongue /Daily   - Can get Baron Sane - best price at [employer] or on [employer] <><><><><><><><><><><><><><><><><><><><><><><><><><><><><><><><><>  -  -  Total  Chol =   236     - Elevated             (  Ideal  or  Goal is less than 180  !  )  & -  Bad / Dangerous LDL  Chol =  146   - also Elevated              (  Ideal  or  Goal is less than 70  !  )   - Sent in 2 meds for Cholesterol & recommend schedule 3 month OV to follow -up.  <><><><><><><><><><><><><><><><><><><><><><><><><><><><><><><><><>  - A1c - Normal - No Diabetes  - Great ! <><><><><><><><><><><><><><><><><><><><><><><><><><><><><><><><><>  - Vitamin D = 32 - Extremely  Low  ( Bad ! )   - Vitamin D goal is between 70-100.   - Recommend that you take                              Vit D 5,000 unit capsules  x 2 capsules = 10,000 units EVERY day  - It is very important as a natural anti-inflammatory and helping the                                  immune system protect against viral infections, like the Covid-19    helping  hair, skin, and nails, as well as reducing stroke and heart attack risk.   - It helps your bones and helps with mood.  - It also decreases numerous cancer risks so                                                                 please take it as directed.   - Low Vit D is associated with a 200-300% higher risk for CANCER   and 200-300% higher risk for HEART   ATTACK  &  STROKE.    - It is also associated with higher death rate at younger ages,   autoimmune diseases like Rheumatoid arthritis, Lupus,  Multiple Sclerosis.     - Also many other  serious conditions, like depression, Alzheimer's Dementia,                                                                   infertility, muscle aches, fatigue, fibromyalgia   - just to name a few. <><><><><><><><><><><><><><><><><><><><><><><><><><><><><><><><><>  - All Else - CBC - Kidneys - Electrolytes - Liver - Magnesium & Thyroid    - all  Normal / OK <><><><><><><><><><><><><><><><><><><><><><><><><><><><><><><><><> <><><><><><><><><><><><><><><><><><><><><><><><><><><><><><><><><>

## 2022-02-21 ENCOUNTER — Ambulatory Visit (AMBULATORY_SURGERY_CENTER): Payer: Medicare Other | Admitting: Gastroenterology

## 2022-02-21 ENCOUNTER — Encounter: Payer: Self-pay | Admitting: Gastroenterology

## 2022-02-21 VITALS — BP 148/67 | HR 63 | Temp 98.7°F | Resp 14 | Ht 69.5 in | Wt 172.0 lb

## 2022-02-21 DIAGNOSIS — D120 Benign neoplasm of cecum: Secondary | ICD-10-CM

## 2022-02-21 DIAGNOSIS — D123 Benign neoplasm of transverse colon: Secondary | ICD-10-CM

## 2022-02-21 DIAGNOSIS — D122 Benign neoplasm of ascending colon: Secondary | ICD-10-CM

## 2022-02-21 DIAGNOSIS — D125 Benign neoplasm of sigmoid colon: Secondary | ICD-10-CM | POA: Diagnosis not present

## 2022-02-21 DIAGNOSIS — Z1211 Encounter for screening for malignant neoplasm of colon: Secondary | ICD-10-CM

## 2022-02-21 MED ORDER — SODIUM CHLORIDE 0.9 % IV SOLN: 500.0000 mL | Freq: Once | INTRAVENOUS | Status: DC

## 2022-02-21 NOTE — Progress Notes (Signed)
I have reviewed the patient's medical history in detail and updated the computerized patient record.

## 2022-02-21 NOTE — Progress Notes (Signed)
See 02/14/2022 H&P, no changes 

## 2022-02-21 NOTE — Progress Notes (Signed)
Sedate, gd SR, tolerated procedure well, VSS, report to RN 

## 2022-02-21 NOTE — Patient Instructions (Signed)
Handout given for polyps.  Await pathology report.  Resume previous diet.  Continue present medications.   YOU HAD AN ENDOSCOPIC PROCEDURE TODAY AT [facility]:   Refer to the procedure report that was given to you for any specific questions about what was found during the examination.  If the procedure report does not answer your questions, please call your gastroenterologist to clarify.  If you requested that your care partner not be given the details of your procedure findings, then the procedure report has been included in a sealed envelope for you to review at your convenience later.  YOU SHOULD EXPECT: Some feelings of bloating in the abdomen. Passage of more gas than usual.  Walking can help get rid of the air that was put into your GI tract during the procedure and reduce the bloating. If you had a lower endoscopy (such as a colonoscopy or flexible sigmoidoscopy) you may notice spotting of blood in your stool or on the toilet paper. If you underwent a bowel prep for your procedure, you may not have a normal bowel movement for a few days.  Please Note:  You might notice some irritation and congestion in your nose or some drainage.  This is from the oxygen used during your procedure.  There is no need for concern and it should clear up in a day or so.  SYMPTOMS TO REPORT IMMEDIATELY:  Following lower endoscopy (colonoscopy or flexible sigmoidoscopy):  Excessive amounts of blood in the stool  Significant tenderness or worsening of abdominal pains  Swelling of the abdomen that is new, acute  Fever of 100F or higher  For urgent or emergent issues, a gastroenterologist can be reached at any hour by calling [phone]. Do not use MyChart messaging for urgent concerns.    DIET:  We do recommend a small meal at first, but then you may proceed to your regular diet.  Drink plenty of fluids but you should avoid alcoholic beverages for 24 hours.  ACTIVITY:  You should plan to  take it easy for the rest of today and you should NOT DRIVE or use heavy machinery until tomorrow (because of the sedation medicines used during the test).    FOLLOW UP: Our staff will call the number listed on your records 24-72 hours following your procedure to check on you and address any questions or concerns that you may have regarding the information given to you following your procedure. If we do not reach you, we will leave a message.  We will attempt to reach you two times.  During this call, we will ask if you have developed any symptoms of COVID 19. If you develop any symptoms (ie: fever, flu-like symptoms, shortness of breath, cough etc.) before then, please call [phone].  If you test positive for Covid 19 in the 2 weeks post procedure, please call and report this information to [place].    If any biopsies were taken you will be contacted by phone or by letter within the next 1-3 weeks.  Please call us at [phone] if you have not heard about the biopsies in 3 weeks.    SIGNATURES/CONFIDENTIALITY: You and/or your care partner have signed paperwork which will be entered into your electronic medical record.  These signatures attest to the fact that that the information above on your After Visit Summary has been reviewed and is understood.  Full responsibility of the confidentiality of this discharge information lies with you and/or your care-partner.

## 2022-02-21 NOTE — Progress Notes (Signed)
Called to room to assist during endoscopic procedure.  Patient ID and intended procedure confirmed with present staff. Received instructions for my participation in the procedure from the performing physician.  

## 2022-02-21 NOTE — Op Note (Signed)
Brittany Hanna Patient Name: Brittany Hanna Procedure Date: 02/21/2022 10:46 AM MRN: [id number] Endoscopist: Ladene Artist , MD Age: 71 Referring MD:  Date of Birth: Mar 19, 1951 Gender: Female Account #: [id number] Procedure:                Colonoscopy Indications:              Screening for colorectal malignant neoplasm Medicines:                Monitored Anesthesia Care Procedure:                Pre-Anesthesia Assessment:                           - Prior to the procedure, a History and Physical                            was performed, and patient medications and                            allergies were reviewed. The patient's tolerance of                            previous anesthesia was also reviewed. The risks                            and benefits of the procedure and the sedation                            options and risks were discussed with the patient.                            All questions were answered, and informed consent                            was obtained. Prior Anticoagulants: The patient has                            taken no previous anticoagulant or antiplatelet                            agents. ASA Grade Assessment: II - A patient with                            mild systemic disease. After reviewing the risks                            and benefits, the patient was deemed in                            satisfactory condition to undergo the procedure.                           After obtaining informed consent, the colonoscope  was passed under direct vision. Throughout the                            procedure, the patient's blood pressure, pulse, and                            oxygen saturations were monitored continuously. The                            Olympus PCF-H190DL [id number]) Colonoscope was                            introduced through the anus and advanced to the the                            cecum,  identified by appendiceal orifice and                            ileocecal valve. The ileocecal valve, appendiceal                            orifice, and rectum were photographed. The quality                            of the bowel preparation was good after substantial                            lavage, suction. The colonoscopy was performed                            without difficulty. The patient tolerated the                            procedure well. Scope In: 10:59:38 AM Scope Out: 11:22:37 AM Scope Withdrawal Time: 0 hours 17 minutes 24 seconds  Total Procedure Duration: 0 hours 22 minutes 59 seconds  Findings:                 The perianal and digital rectal examinations were                            normal.                           Five sessile polyps were found in the sigmoid                            colon, transverse colon (2), ascending colon and                            cecum. The polyps were 6 to 10 mm in size. These                            polyps were removed with a cold snare. Resection  and retrieval were complete.                           The exam was otherwise without abnormality on                            direct and retroflexion views. Complications:            No immediate complications. Estimated blood loss:                            None. Estimated Blood Loss:     Estimated blood loss: none. Impression:               - Five 6 to 10 mm polyps in the sigmoid colon, in                            the transverse colon, in the ascending colon and in                            the cecum, removed with a cold snare. Resected and                            retrieved.                           - The examination was otherwise normal on direct                            and retroflexion views. Recommendation:           - Repeat colonoscopy, likely 3 years, after studies                            are complete for surveillance based on  pathology                            results.                           - Patient has a contact number available for                            emergencies. The signs and symptoms of potential                            delayed complications were discussed with the                            patient. Return to normal activities tomorrow.                            Written discharge instructions were provided to the                            patient.                           -  Resume previous diet.                           - Continue present medications.                           - Await pathology results. Ladene Artist, MD 02/21/2022 11:27:47 AM This report has been signed electronically.

## 2022-02-22 ENCOUNTER — Telehealth: Payer: Self-pay

## 2022-02-22 NOTE — Telephone Encounter (Signed)
  Follow up Call-     02/21/2022   10:00 AM  Call back number  Post procedure Call Back phone  # [phone]  Permission to leave phone message Yes     Patient questions:  Do you have a fever, pain , or abdominal swelling? No. Pain Score  0 *  Have you tolerated food without any problems? Yes.    Have you been able to return to your normal activities? Yes.    Do you have any questions about your discharge instructions: Diet   No. Medications  No. Follow up visit  No.  Do you have questions or concerns about your Care? No.  Actions: * If pain score is 4 or above: No action needed, pain <4.

## 2022-03-01 ENCOUNTER — Encounter: Payer: Self-pay | Admitting: Gastroenterology

## 2022-04-07 IMAGING — MG DIGITAL SCREENING BREAST BILAT IMPLANT W/ TOMO W/ CAD
9 of 12 series · 9 of 28 positions shown · non-contrast
Comparison: Previous exam(s).

CLINICAL DATA: Screening.

EXAM:
DIGITAL SCREENING BILATERAL MAMMOGRAM WITH IMPLANTS, CAD AND
TOMOSYNTHESIS
TECHNIQUE: Bilateral screening digital craniocaudal and mediolateral oblique
mammograms were obtained. Bilateral screening digital breast
tomosynthesis was performed. The images were evaluated with
computer-aided detection. Standard and/or implant displaced views
were performed.

[R CC]
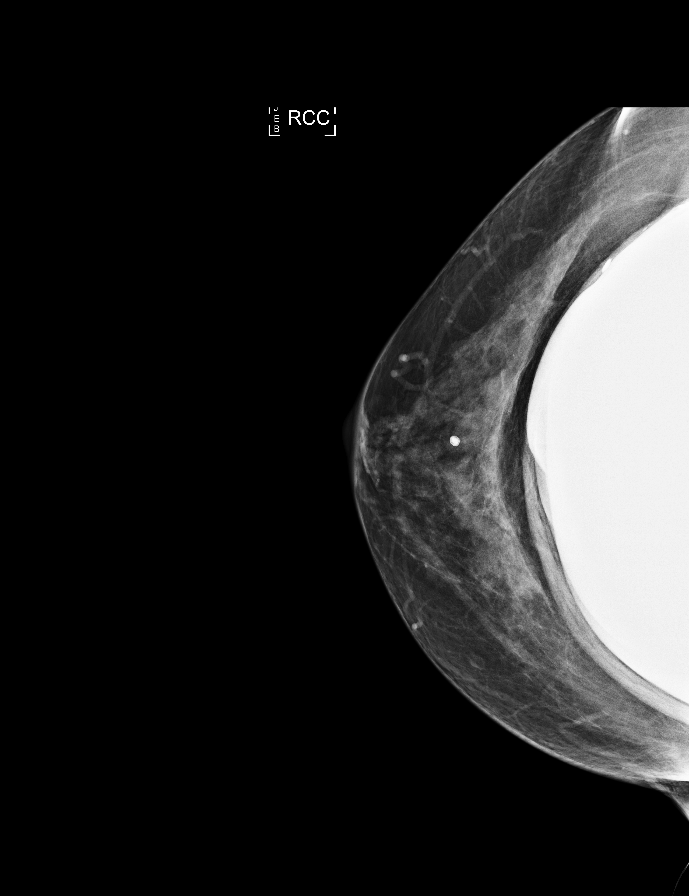

[R MLO]
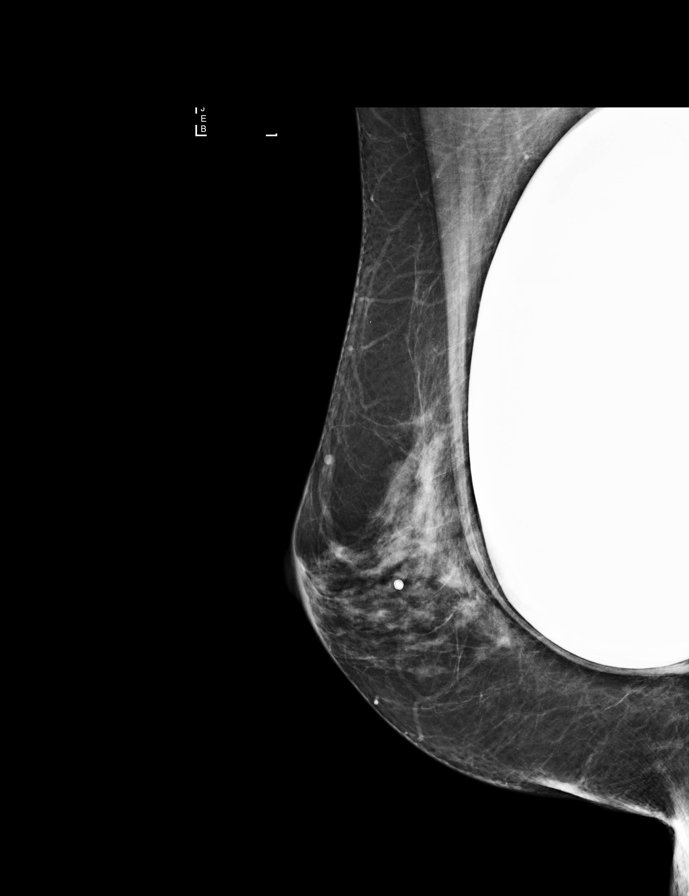

[L CC]
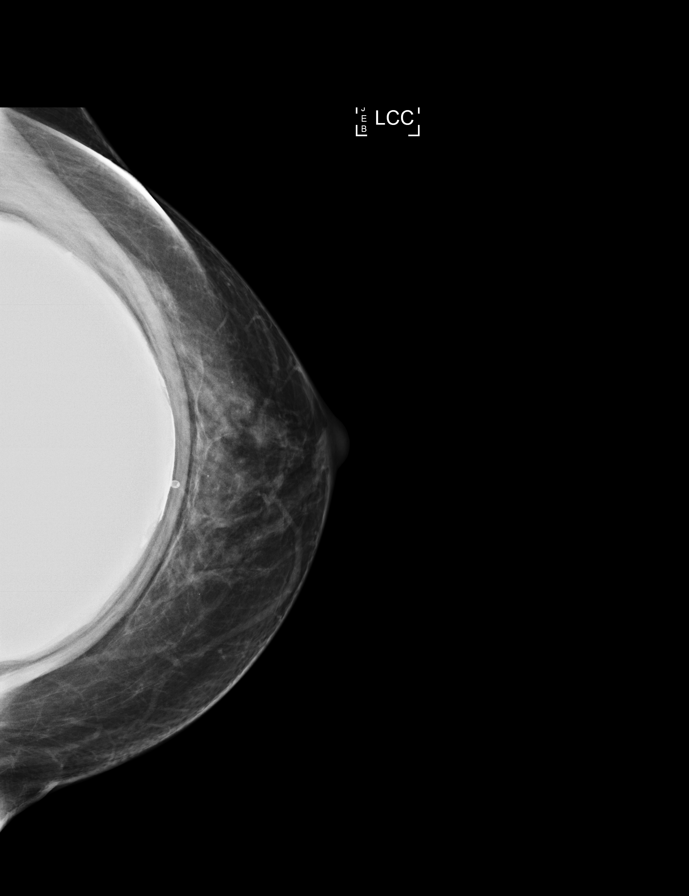

[L MLO]
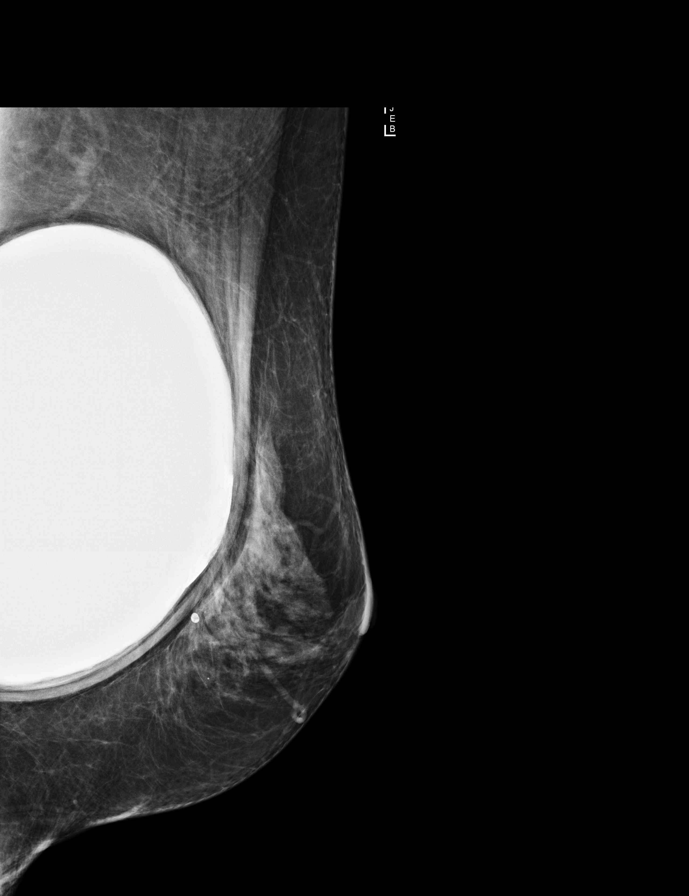

[R MLO synth-2D]
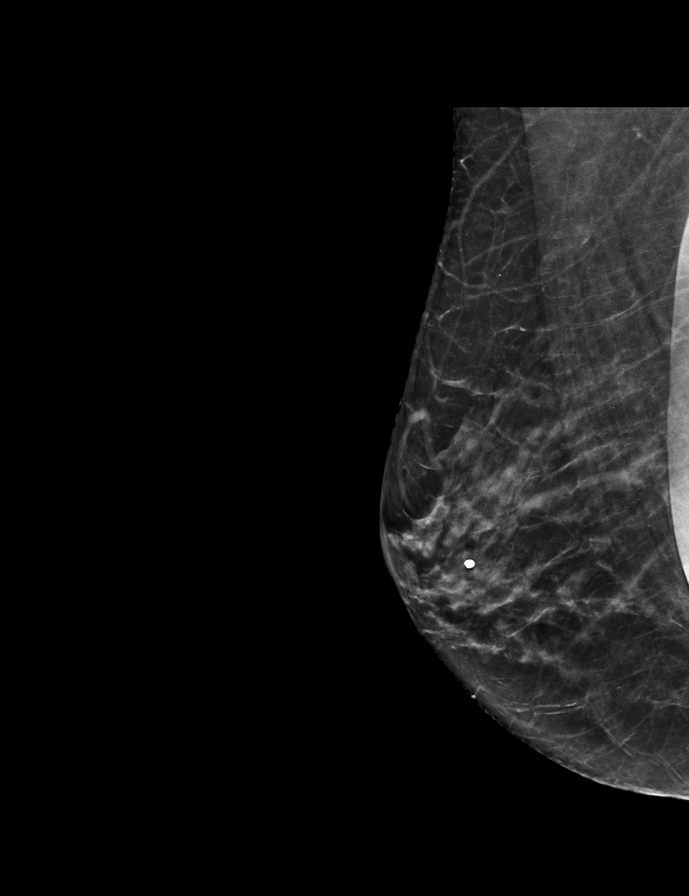

[L CC synth-2D]
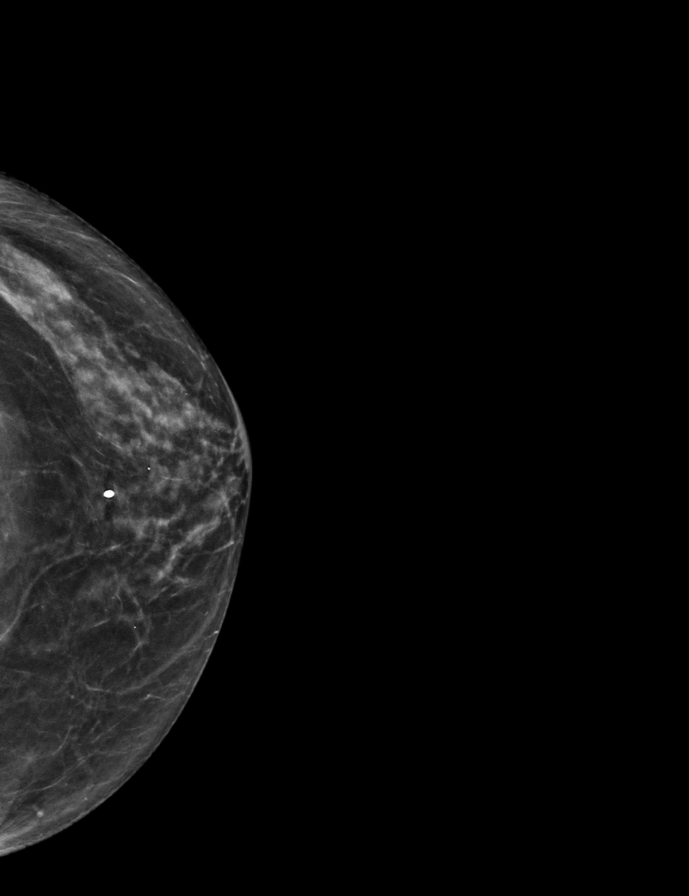

[R CC synth-2D]
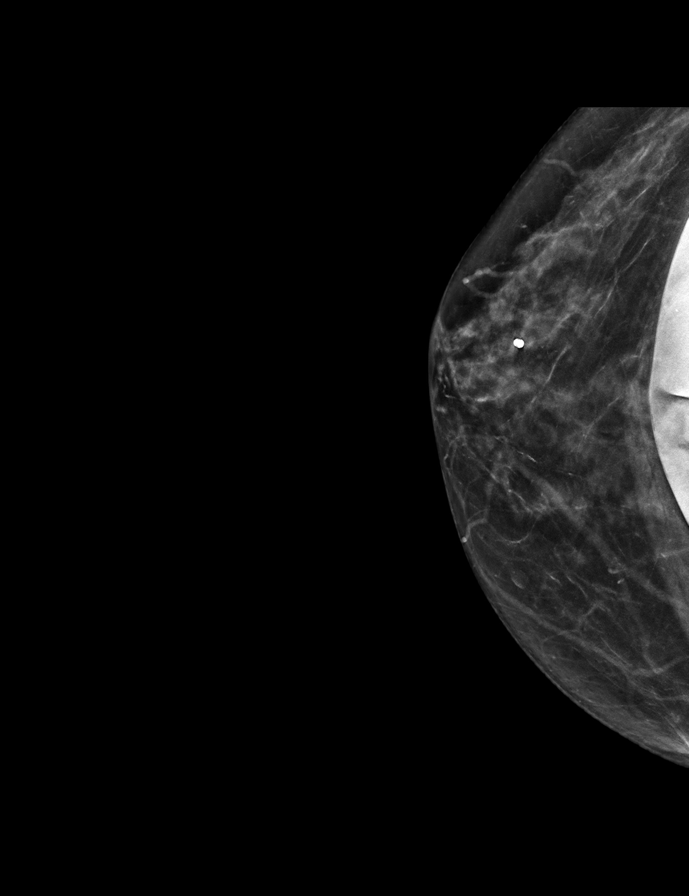

[L MLO synth-2D]
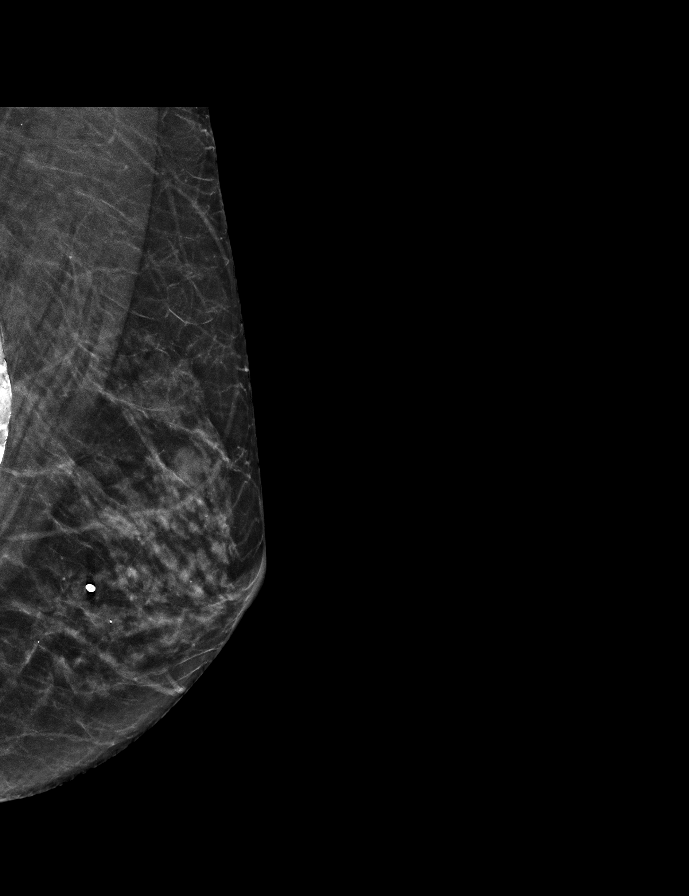

[R MLOID BREAST TOMOSYNTHESIS IMAGE tomo · tomo slice 27/52.0]
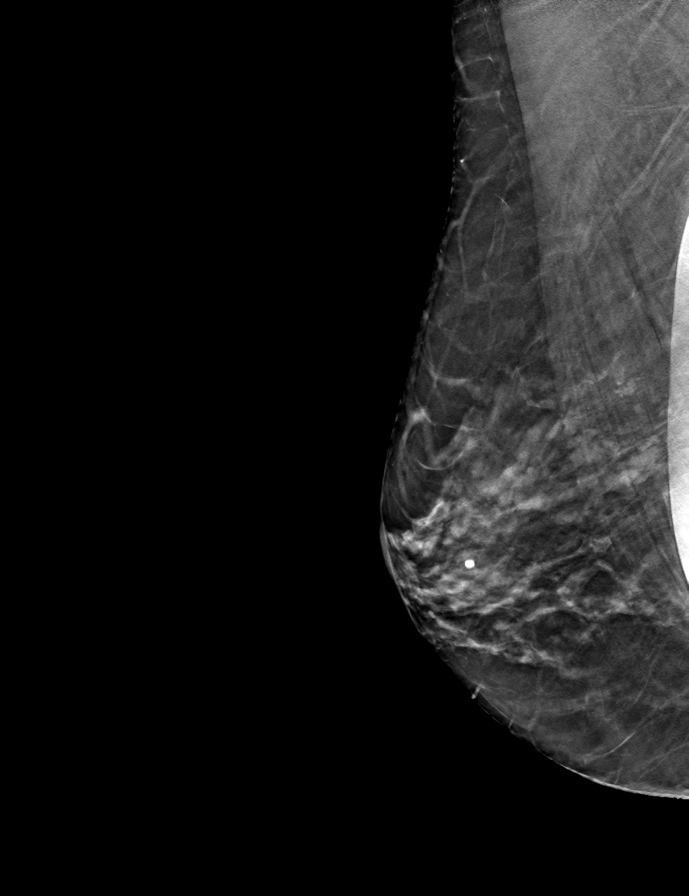

[9 of 28 positions shown; findings below may reference images not displayed]

ACR Breast Density Category c: The breast tissue is heterogeneously
dense, which may obscure small masses.
FINDINGS: The patient has retropectoral implants. There are no findings
suspicious for malignancy.
IMPRESSION: No mammographic evidence of malignancy. A result letter of this
screening mammogram will be mailed directly to the patient.

RECOMMENDATION:
Screening mammogram in one year. (Code:LT-E-7TH)

BI-RADS CATEGORY  1:  Negative.

## 2022-05-02 ENCOUNTER — Ambulatory Visit
Admission: RE | Admit: 2022-05-02 | Discharge: 2022-05-02 | Disposition: A | Discharge status: Home / Self Care | Payer: Medicare Other | Source: Ambulatory Visit | Attending: Family Medicine | Admitting: Family Medicine

## 2022-05-02 VITALS — BP 158/78 | HR 74 | Temp 100.3°F | Resp 16

## 2022-05-02 DIAGNOSIS — U071 COVID-19: Secondary | ICD-10-CM | POA: Insufficient documentation

## 2022-05-02 DIAGNOSIS — B349 Viral infection, unspecified: Secondary | ICD-10-CM

## 2022-05-02 DIAGNOSIS — I129 Hypertensive chronic kidney disease with stage 1 through stage 4 chronic kidney disease, or unspecified chronic kidney disease: Secondary | ICD-10-CM | POA: Diagnosis not present

## 2022-05-02 DIAGNOSIS — N182 Chronic kidney disease, stage 2 (mild): Secondary | ICD-10-CM | POA: Diagnosis not present

## 2022-05-02 DIAGNOSIS — F17210 Nicotine dependence, cigarettes, uncomplicated: Secondary | ICD-10-CM | POA: Insufficient documentation

## 2022-05-02 DIAGNOSIS — F419 Anxiety disorder, unspecified: Secondary | ICD-10-CM | POA: Diagnosis not present

## 2022-05-02 DIAGNOSIS — E538 Deficiency of other specified B group vitamins: Secondary | ICD-10-CM | POA: Diagnosis not present

## 2022-05-02 LAB — RESP PANEL BY RT-PCR (FLU A&B, COVID) ARPGX2
Influenza A by PCR: NEGATIVE
Influenza B by PCR: NEGATIVE
SARS Coronavirus 2 by RT PCR: POSITIVE — AB

## 2022-05-02 NOTE — ED Triage Notes (Signed)
Patient presents to Urgent Care with complaints of headache, bilateral ear pain, sinus pain and upper back pain since midnight. Taking tylenol. Last dose 1200.

## 2022-05-02 NOTE — Discharge Instructions (Addendum)
Your COVID/Influenza results will be available in less than 24 hours. Negative results are immediately resulted to Mychart. Positive results will receive a follow-up call from our clini and at that time if positive you can request antivirals.   If symptoms are present, I recommend home quarantine until results are known.   Symptom management warranted only.  Manage fever with Tylenol and ibuprofen.  Nasal symptoms with over-the-counter antihistamines recommended and if cough present Mucinex or Delsym. If symptoms become severe, go to the nearest Emergency -Department, otherwise most viral illness resolve with symptom management within 7-10 days.

## 2022-05-02 NOTE — ED Provider Notes (Signed)
Brittany Hanna    CSN: [id number] Arrival date & time: 05/02/22  1545      History   Chief Complaint Chief Complaint  Patient presents with   Headache    Sinuses But Ning chest - Entered by patient   Facial Pain   Otalgia   Back Pain    HPI Brittany Hanna is a 71 y.o. female.   HPI Patient with history of hypertension, anxiety, current smoker, and B12 deficiency,  presents for evaluation of acute onset, sinus congestion, postnasal drainage, throat irritation, headache, and itchy throat.  Patient also developed fever earlier this morning.  On arrival she still is febrile at 100.3.  She last took Tylenol midday today.  She denies any known exposure to anyone positive for COVID or flu.  Patient is interested in antiviral therapy if she is positive for COVID or flu.  She denies any worrisome symptoms of chest pain, shortness of breath or wheezing.  She endorses a chest burning which she has been managing with OTC TUMS which has improved symptoms although has not completely resolved chest burning. She is also having body aches now, most prominently in the back as the day has progressed. She has not taken any other medication but tylenol.  Past Medical History:  Diagnosis Date   Anxiety    B12 deficiency    FHx: heart disease 10/07/2018   High cholesterol    Hypertension    IBS (irritable bowel syndrome)    Vitamin D deficiency     Patient Active Problem List   Diagnosis Date Noted   LBBB  (2021)  02/14/2022   Osteoporosis 01/07/2022   B12 deficiency 07/22/2021   COPD (chronic obstructive pulmonary disease) ([place]) - per CXR 2010 01/07/2019   OAB (overactive bladder) 10/07/2018   Dysthymia 10/07/2018   Gastroesophageal reflux disease 10/07/2018   Aortic atherosclerosis ([place]) bny Abd CT scan on 10/30/2017 10/30/2017   Smoker 10/24/2017   Anxiety 10/24/2017   CKD (chronic kidney disease) stage 2, GFR 60-89 ml/min 10/23/2017   BMI 25.0-25.9,adult 10/23/2017    Hyperlipidemia, mixed 08/08/2013   Essential hypertension 08/08/2013   Vitamin D deficiency 08/08/2013   Abnormal glucose 08/08/2013    Past Surgical History:  Procedure Laterality Date   AUGMENTATION MAMMAPLASTY     CATARACT EXTRACTION, BILATERAL Bilateral 2019   Dr. Tommy Rainwater    COLONOSCOPY  03/30/2010   Dr.Stark   TUBAL LIGATION      OB History   No obstetric history on file.      Home Medications    Prior to Admission medications   Medication Sig Start Date End Date Taking? Authorizing Provider  ALPRAZolam Duanne Moron) 0.25 MG tablet Take 0.25 mg by mouth daily as needed for anxiety.    [provider]  ezetimibe (ZETIA) 10 MG tablet Take  1 tablet  Daily  for Cholesterol 02/15/22   Unk Pinto, MD  rosuvastatin (CRESTOR) 10 MG tablet Take 1 tablet Daily for Cholesterol 02/15/22   Unk Pinto, MD    Family History Family History  Problem Relation Age of Onset   Heart failure Mother    Osteoarthritis Father    Hypertension Father    COPD Father    Breast cancer Maternal Aunt    Colon cancer Neg Hx    Esophageal cancer Neg Hx    Stomach cancer Neg Hx    Rectal cancer Neg Hx     Social History Social History   Tobacco Use   Smoking status:  Every Day    Packs/day: 0.75    Years: 40.00    Total pack years: 30.00    Types: Cigarettes   Smokeless tobacco: Never  Vaping Use   Vaping Use: Never used  Substance Use Topics   Alcohol use: Yes    Alcohol/week: 3.0 standard drinks of alcohol    Types: 3 Glasses of wine per week   Drug use: No     Allergies   Lipitor [atorvastatin]   Review of Systems Review of Systems Pertinent negatives listed in HPI   Physical Exam Triage Vital Signs ED Triage Vitals [05/02/22 1600]  Enc Vitals Group     BP (!) 158/78     Pulse Rate 74     Resp 16     Temp 100.3 F (37.9 C)     Temp Source Oral     SpO2 96 %     Weight      Height      Head Circumference      Peak Flow      Pain Score      Pain  Loc      Pain Edu?      Excl. in [place]?    No data found.  Updated Vital Signs BP (!) 158/78 (BP Location: Left Arm)   Pulse 74   Temp 100.3 F (37.9 C) (Oral)   Resp 16   SpO2 96%   Visual Acuity Right Eye Distance:   Left Eye Distance:   Bilateral Distance:    Right Eye Near:   Left Eye Near:    Bilateral Near:     Physical Exam Vitals reviewed.  Constitutional:      Appearance: Normal appearance. She is well-developed. She is ill-appearing.  HENT:     Head: Normocephalic and atraumatic.     Right Ear: Tympanic membrane, ear canal and external ear normal.     Left Ear: Tympanic membrane, ear canal and external ear normal.     Nose: Congestion and rhinorrhea present.  Eyes:     Extraocular Movements: Extraocular movements intact.     Pupils: Pupils are equal, round, and reactive to light.     Comments: Sclera erythematous bilaterally  Cardiovascular:     Rate and Rhythm: Normal rate and regular rhythm.  Pulmonary:     Effort: Pulmonary effort is normal.     Breath sounds: Normal breath sounds.  Musculoskeletal:     Cervical back: Normal range of motion and neck supple.  Lymphadenopathy:     Cervical: No cervical adenopathy.  Skin:    General: Skin is warm and dry.     Capillary Refill: Capillary refill takes less than 2 seconds.  Neurological:     General: No focal deficit present.     Mental Status: She is alert and oriented to person, place, and time.  Psychiatric:        Mood and Affect: Mood normal.        Behavior: Behavior normal.      UC Treatments / Results  Labs (all labs ordered are listed, but only abnormal results are displayed) Labs Reviewed  RESP PANEL BY RT-PCR (FLU A&B, COVID) [id number]    EKG   Radiology No results found.  Procedures Procedures (including critical care time)  Medications Ordered in UC Medications - No data to display  Initial Impression / Assessment and Plan / UC Course  I have reviewed the triage vital signs  and the nursing notes.  Pertinent labs &  imaging results that were available during my care of the patient were reviewed by me and considered in my medical decision making (see chart for details).    Viral Illness, highly suspicious for COVID or Influenza  Viral Respiratory panel pending. Symptom management warranted only. Discussed antivirals, patient qualifies for Paxlovid GFR > 68, ok to prescribed if positive.  Hydrate well with fluids.  Final Clinical Impressions(s) / UC Diagnoses   Final diagnoses:  Viral illness     Discharge Instructions      Your COVID/Influenza results will be available in less than 24 hours. Negative results are immediately resulted to Mychart. Positive results will receive a follow-up call from our clini and at that time if positive you can request antivirals.   If symptoms are present, I recommend home quarantine until results are known.   Symptom management warranted only.  Manage fever with Tylenol and ibuprofen.  Nasal symptoms with over-the-counter antihistamines recommended and if cough present Mucinex or Delsym. If symptoms become severe, go to the nearest Emergency -Department, otherwise most viral illness resolve with symptom management within 7-10 days.   ED Prescriptions   None    PDMP not reviewed this encounter.   Scot Jun, FNP 05/02/22 [phone]

## 2022-05-03 ENCOUNTER — Telehealth: Payer: Self-pay | Admitting: Family Medicine

## 2022-05-03 MED ORDER — NIRMATRELVIR/RITONAVIR (PAXLOVID)TABLET: 3.0000 | ORAL_TABLET | Freq: Two times a day (BID) | ORAL | 0 refills | Status: AC

## 2022-05-03 NOTE — Telephone Encounter (Signed)
Patient called requesting antiviral therapy.  Patient's COVID test was positive.  Per our discussion during yesterday's encounter prescribed Paxlovid.

## 2022-06-30 DIAGNOSIS — Z23 Encounter for immunization: Secondary | ICD-10-CM | POA: Diagnosis not present

## 2022-07-19 ENCOUNTER — Ambulatory Visit: Payer: Medicare Other | Admitting: Adult Health

## 2022-07-26 ENCOUNTER — Ambulatory Visit (INDEPENDENT_AMBULATORY_CARE_PROVIDER_SITE_OTHER): Payer: Medicare Other | Admitting: Nurse Practitioner

## 2022-07-26 ENCOUNTER — Encounter: Payer: Self-pay | Admitting: Nurse Practitioner

## 2022-07-26 VITALS — BP 118/70 | HR 77 | Temp 97.9°F | Resp 16 | Ht 70.0 in | Wt 170.8 lb

## 2022-07-26 DIAGNOSIS — R7309 Other abnormal glucose: Secondary | ICD-10-CM

## 2022-07-26 DIAGNOSIS — N182 Chronic kidney disease, stage 2 (mild): Secondary | ICD-10-CM | POA: Diagnosis not present

## 2022-07-26 DIAGNOSIS — F17200 Nicotine dependence, unspecified, uncomplicated: Secondary | ICD-10-CM

## 2022-07-26 DIAGNOSIS — N3281 Overactive bladder: Secondary | ICD-10-CM | POA: Diagnosis not present

## 2022-07-26 DIAGNOSIS — E559 Vitamin D deficiency, unspecified: Secondary | ICD-10-CM

## 2022-07-26 DIAGNOSIS — E782 Mixed hyperlipidemia: Secondary | ICD-10-CM

## 2022-07-26 DIAGNOSIS — E2839 Other primary ovarian failure: Secondary | ICD-10-CM

## 2022-07-26 DIAGNOSIS — Z0000 Encounter for general adult medical examination without abnormal findings: Secondary | ICD-10-CM

## 2022-07-26 DIAGNOSIS — Z79899 Other long term (current) drug therapy: Secondary | ICD-10-CM

## 2022-07-26 DIAGNOSIS — I700 Atherosclerosis of aorta: Secondary | ICD-10-CM | POA: Diagnosis not present

## 2022-07-26 DIAGNOSIS — Z6825 Body mass index (BMI) 25.0-25.9, adult: Secondary | ICD-10-CM

## 2022-07-26 DIAGNOSIS — R6889 Other general symptoms and signs: Secondary | ICD-10-CM

## 2022-07-26 DIAGNOSIS — I10 Essential (primary) hypertension: Secondary | ICD-10-CM | POA: Diagnosis not present

## 2022-07-26 DIAGNOSIS — Z0001 Encounter for general adult medical examination with abnormal findings: Secondary | ICD-10-CM | POA: Diagnosis not present

## 2022-07-26 DIAGNOSIS — G4483 Primary cough headache: Secondary | ICD-10-CM

## 2022-07-26 DIAGNOSIS — M810 Age-related osteoporosis without current pathological fracture: Secondary | ICD-10-CM

## 2022-07-26 DIAGNOSIS — F419 Anxiety disorder, unspecified: Secondary | ICD-10-CM

## 2022-07-26 DIAGNOSIS — J42 Unspecified chronic bronchitis: Secondary | ICD-10-CM | POA: Diagnosis not present

## 2022-07-26 DIAGNOSIS — Z1231 Encounter for screening mammogram for malignant neoplasm of breast: Secondary | ICD-10-CM

## 2022-07-26 LAB — POCT INFLUENZA A/B
Influenza A, POC: NEGATIVE
Influenza B, POC: NEGATIVE

## 2022-07-26 LAB — POC COVID19 BINAXNOW: SARS Coronavirus 2 Ag: NEGATIVE

## 2022-07-26 MED ORDER — PROMETHAZINE-DM 6.25-15 MG/5ML PO SYRP: 5.0000 mL | ORAL_SOLUTION | Freq: Four times a day (QID) | ORAL | 0 refills | Status: DC | PRN

## 2022-07-26 NOTE — Patient Instructions (Signed)

## 2022-07-26 NOTE — Progress Notes (Signed)
ANNUAL MEDICARE VISIT AND FOLLOW UP  Assessment:   Diagnoses and all orders for this visit:  Annual Medicare Wellness Visit Due annually  Health maintenance reviewed  Atherosclerosis of aorta ([place]) - per CT 10/2017 Control blood pressure, cholesterol, glucose, increase exercise.   COPD ([place]) - CXR 2010 No inhaler use Smoking cessation instruction/counseling given:  counseled patient on the dangers of tobacco use, advised patient to stop smoking, and reviewed strategies to maximize success   Essential hypertension Discussed DASH (Dietary Approaches to Stop Hypertension) DASH diet is lower in sodium than a typical American diet. Cut back on foods that are high in saturated fat, cholesterol, and trans fats. Eat more whole-grain foods, fish, poultry, and nuts Remain active and exercise as tolerated daily.  Monitor BP at home-Call if greater than 130/80.  Check and monitor CMP/CBC   Abnormal glucose Education: Reviewed 'ABCs' of diabetes management  Discussed goals to be met and/or maintained include A1C (<7) Blood pressure (<130/80) Cholesterol (LDL <70) Continue Eye Exam yearly  Continue Dental Exam Q6 mo Discussed dietary recommendations Discussed Physical Activity recommendations Foot exam UTD Check and monitor A1C  Medication management All medications discussed and reviewed in full. All questions and concerns regarding medications addressed.     Mixed hyperlipidemia Discussed lifestyle modifications. Recommended diet heavy in fruits and veggies, omega 3's. Decrease consumption of animal meats, cheeses, and dairy products. Remain active and exercise as tolerated. Continue to monitor. Check and monitor lipids/TSH   Vitamin D deficiency Continue supplementation for goal of 60-100 Check  and monitor Vitamin D  CKD (chronic kidney disease), symptom management only, stage 2 (mild) Continue ACE, bASA, statin Discussed how what you eat and drink can aide in kidney  protection. Stay well hydrated. Avoid high salt foods. Avoid NSAIDS. Keep BP and BG well controlled.   Take medications as prescribed. Remain active and exercise as tolerated daily. Maintain weight.  Continue to monitor. Check and monitor CMP/GFR/Microablumin   BMI 25.0-25.9, adult At goal Recommended diet heavy in fruits and veggies and low in animal meats, cheeses, and dairy products, appropriate calorie intake Discussed exercise recommendations Continue to monitor at each visit  OAB Takes oxybutynin PRN and doing well  Continue to monitor   Estrogen deficiency Continue DEXA screening Continue to monitor  Smoker Smoking cessation instruction/counseling given:  counseled patient on the dangers of tobacco use, advised patient to stop smoking, and reviewed strategies to maximize success  Anxiety/dysthymia Continue medications. Reviewed relaxation techniques.  Sleep hygiene. Recommended mindfulness meditation and exercise.   Limit/Decrease/Monitor drug/alcohol intake.    Headache/Cough Flu & Covid Negative  Start Promethazine cough syrup Stay well hydrated  Breast Cancer screening Mammogram ordered  Osteoporosis Pursue a combination of weight-bearing exercises and strength training. Advised on fall prevention measures including proper lighting in all rooms, removal of area rugs and floor clutter, use of walking devices as deemed appropriate, avoidance of uneven walking surfaces. Smoking cessation and moderate alcohol consumption if applicable Consume 546 to 1000 IU of vitamin D daily with a goal vitamin D serum value of 30 ng/mL or higher. Aim for 1000 to 1200 mg of elemental calcium daily through supplements and/or dietary sources.   Orders Placed This Encounter  Procedures   CBC with Differential/Platelet   COMPLETE METABOLIC PANEL WITH GFR   Lipid panel   POCT Influenza A/B   POC COVID-19    Order Specific Question:   Previously tested for COVID-19     Answer:   Yes  Order Specific Question:   Resident in a congregate (group) care setting    Answer:   No    Order Specific Question:   Employed in healthcare setting    Answer:   Yes    Order Specific Question:   Pregnant    Answer:   No   Meds ordered this encounter  Medications   promethazine-dextromethorphan (PROMETHAZINE-DM) 6.25-15 MG/5ML syrup    Sig: Take 5 mLs by mouth 4 (four) times daily as needed for cough.    Dispense:  240 mL    Refill:  0    Order Specific Question:   Supervising Provider    Answer:   Unk Pinto [phone]    Notify office for further evaluation and treatment, questions or concerns if any reported s/s fail to improve.   The patient was advised to call back or seek an in-person evaluation if any symptoms worsen or if the condition fails to improve as anticipated.   Further disposition pending results of labs. Discussed med's effects and SE's.    I discussed the assessment and treatment plan with the patient. The patient was provided an opportunity to ask questions and all were answered. The patient agreed with the plan and demonstrated an understanding of the instructions.  Discussed med's effects and SE's. Screening labs and tests as requested with regular follow-up as recommended.  I provided 30 minutes of face-to-face time during this encounter including counseling, chart review, and critical decision making was preformed.  Future Appointments  Date Time Provider [place]  02/16/2023  3:00 PM Unk Pinto, MD [facility] None     Plan:   During the course of the visit the patient was educated and counseled about appropriate screening and preventive services including:   Pneumococcal vaccine  Prevnar 13 Influenza vaccine Td vaccine Screening electrocardiogram Bone densitometry screening Colorectal cancer screening Diabetes screening Glaucoma screening Nutrition counseling  Advanced directives: requested   Subjective:   Brittany Hanna is a 71 y.o. female who presents for Medicare Annual Wellness Visit and 3 month follow up. She has Hyperlipidemia, mixed; Essential hypertension; Vitamin D deficiency; Abnormal glucose; CKD (chronic kidney disease) stage 2, GFR 60-89 ml/min; BMI 25.0-25.9,adult; Smoker; Anxiety; Aortic atherosclerosis ([facility]) bny Abd CT scan on 10/30/2017; OAB (overactive bladder); Dysthymia; Gastroesophageal reflux disease; COPD (chronic obstructive pulmonary disease) ([place]) - per CXR 2010; B12 deficiency; Osteoporosis; and LBBB  (2021)  on their problem list.  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     Reports having a fever yesterday of 100 degrees F.  She has cough and congestion with HA.  She is feeling somewhat better today.   She recently tested positive for Covid 05/02/22.    she currently continues to smoke 1 pack a day; discussed risks associated with smoking, patient is not ready to quit. She has 40 pack/year history; low dose CT screening has been discussed and she is agreeable. Last CXR in 01/2021 with emphysematous changes. Also calcified granuloma.   She has OAB on oxybutynin PRN and does well taking on as needed, typically takes if she will be out in public for extended perids.    She has hx of depression/anxiety well controlled on zoloft 50 mg daily. Has PRN xanax, uses rarely, only every few months.   she has a diagnosis of GERD which is currently managed by omeprazole 40 mg PRN, takes occasionally only and reports well controlled sx.   BMI is Body mass index is 24.51 kg/m., she  has been working on diet and exercise. Avoids meat, focuses on vegetables and fruit. Walks when weather permits.  Wt Readings from Last 3 Encounters:  07/26/22 170 lb 12.8 oz (77.5 kg)  02/21/22 172 lb (78 kg)  02/14/22 176 lb 3.2 oz (79.9 kg)   She does not check her BP at home, today their BP is BP: 118/70 She does workout. She denies chest pain, shortness of breath, dizziness.   She had negative cardiolite in 2010.   She is on cholesterol medication (rosuvastatin 20 mg daily) and denies myalgias. Severe reaction to higher dose statin in the past. Her LDL cholesterol is at goal. The cholesterol last visit was:   Lab Results  Component Value Date   CHOL 236 (H) 02/14/2022   HDL 64 02/14/2022   LDLCALC 146 (H) 02/14/2022   TRIG 135 02/14/2022   CHOLHDL 3.7 02/14/2022    She has been working on diet and exercise for glucose management, and denies increased appetite, nausea, paresthesia of the feet, polydipsia, polyuria, visual disturbances, vomiting and weight loss. Last A1C in the office was:  Lab Results  Component Value Date   HGBA1C 4.8 02/14/2022    Last GFR: Lab Results  Component Value Date   GFRNONAA 68 02/02/2021   Patient is on Vitamin D supplement but remains well below goal of 60:   Lab Results  Component Value Date   VD25OH 32 02/14/2022     She has B12 supplement, hasn't been taking.  Lab Results  Component Value Date   VITAMINB12 197 (L) 02/14/2022     Medication Review: Current Outpatient Medications on File Prior to Visit  Medication Sig Dispense Refill   ALPRAZolam (XANAX) 0.25 MG tablet Take 0.25 mg by mouth daily as needed for anxiety.     ezetimibe (ZETIA) 10 MG tablet Take  1 tablet  Daily  for Cholesterol 90 tablet 3   rosuvastatin (CRESTOR) 10 MG tablet Take 1 tablet Daily for Cholesterol 90 tablet 3   No current facility-administered medications on file prior to visit.    Allergies  Allergen Reactions   Lipitor [Atorvastatin] Other (See Comments)    Fatigue.    Current Problems (verified) Patient Active Problem List   Diagnosis Date Noted   LBBB  (2021)  02/14/2022   Osteoporosis 01/07/2022   B12 deficiency 07/22/2021   COPD (chronic obstructive pulmonary disease) ([place]) - per CXR 2010 01/07/2019   OAB (overactive bladder) 10/07/2018   Dysthymia 10/07/2018   Gastroesophageal reflux disease 10/07/2018   Aortic  atherosclerosis ([place]) bny Abd CT scan on 10/30/2017 10/30/2017   Smoker 10/24/2017   Anxiety 10/24/2017   CKD (chronic kidney disease) stage 2, GFR 60-89 ml/min 10/23/2017   BMI 25.0-25.9,adult 10/23/2017   Hyperlipidemia, mixed 08/08/2013   Essential hypertension 08/08/2013   Vitamin D deficiency 08/08/2013   Abnormal glucose 08/08/2013    Screening Tests Immunization History  Administered Date(s) Administered   Fluad Quad(high Dose 65+) 07/10/2020   Influenza, High Dose Seasonal PF 06/29/2018, 06/28/2019, 07/05/2021   PFIZER(Purple Top)SARS-COV-2 Vaccination 04/16/2020, 05/07/2020, 06/11/2021   PPD Test 03/19/2014, 03/24/2015, 06/01/2016   Pneumococcal Conjugate-13 07/24/2017   Pneumococcal Polysaccharide-23 06/01/2016   Tdap 01/24/2013   Zoster, Live 11/03/2013    Preventative care: Last colonoscopy: 02/2022 Recall 3 years  Last mammogram: 2012, schedule due to poor follow through, reordered  Last pap smear/pelvic exam: remote by GYN, never abnormal - DONE DEXA: 01/2022 -3.4 Osteoporosis   Prior vaccinations: TD or Tdap: 2014  Influenza: 2023  Pneumococcal: 2017 Prevnar13: 2018 Shingles/Zostavax: 2015 Covid 19: 2/2, 2021, just had booster 06/11/2021  Names of Other Physician/Practitioners you currently use: 1. [facility] here for primary care 2. Dr. Jefm Bryant, eye doctor, last visit 2022 3. Puckett, dentist, last visit 2023, goes q82m Patient Care Team: MUnk Pinto MD as PCP - General (Internal Medicine)  SURGICAL HISTORY She  has a past surgical history that includes Tubal ligation; Cataract extraction, bilateral (Bilateral, 2019); Augmentation mammaplasty; and Colonoscopy (03/30/2010). FAMILY HISTORY Her family history includes Breast cancer in her maternal aunt; COPD in her father; Heart failure in her mother; Hypertension in her father; Osteoarthritis in her father. SOCIAL HISTORY She  reports that she has been  smoking cigarettes. She has a 30.00 pack-year smoking history. She has never used smokeless tobacco. She reports current alcohol use of about 3.0 standard drinks of alcohol per week. She reports that she does not use drugs.   MEDICARE WELLNESS OBJECTIVES: Physical activity:   Cardiac risk factors:   Depression/mood screen:      07/26/2022   11:23 AM  Depression screen PHQ 2/9  Decreased Interest 0  Down, Depressed, Hopeless 0  PHQ - 2 Score 0    ADLs:     07/26/2022   11:22 AM 02/13/2022   10:37 PM  In your present state of health, do you have any difficulty performing the following activities:  Hearing? 0 0  Comment Wears Hearing Aides   Vision? 0 0  Difficulty concentrating or making decisions? 0 0  Walking or climbing stairs? 0 0  Dressing or bathing? 0 0  Doing errands, shopping? 0 0  Preparing Food and eating ? N   Using the Toilet? N   In the past six months, have you accidently leaked urine? N   Do you have problems with loss of bowel control? N   Managing your Medications? N   Managing your Finances? N   Housekeeping or managing your Housekeeping? N      Cognitive Testing  Alert? Yes  Normal Appearance?Yes  Oriented to person? Yes  Place? Yes   Time? Yes  Recall of three objects?  Yes  Can perform simple calculations? Yes  Displays appropriate judgment?Yes  Can read the correct time from a watch face?Yes  EOL planning: Does Patient Have a Medical Advance Directive?: No  Review of Systems  Constitutional:  Negative for malaise/fatigue and weight loss.  HENT:  Negative for hearing loss and tinnitus.   Eyes:  Negative for blurred vision and double vision.  Respiratory:  Negative for cough, shortness of breath and wheezing.   Cardiovascular:  Negative for chest pain, palpitations, orthopnea, claudication and leg swelling.  Gastrointestinal:  Negative for abdominal pain, blood in stool, constipation, diarrhea, heartburn, melena, nausea and vomiting.   Genitourinary: Negative.   Musculoskeletal:  Negative for joint pain and myalgias.  Skin:  Negative for rash.  Neurological:  Negative for dizziness, tingling, sensory change, weakness and headaches.  Endo/Heme/Allergies:  Negative for polydipsia.  Psychiatric/Behavioral: Negative.    All other systems reviewed and are negative.    Objective:     Today's Vitals   07/26/22 1001  BP: 118/70  Pulse: 77  Resp: 16  Temp: 97.9 F (36.6 C)  SpO2: 96%  Weight: 170 lb 12.8 oz (77.5 kg)  Height: 5' 10" (1.778 m)   Body mass index is 24.51 kg/m.  General appearance: alert, no distress, WD/WN, female HEENT: normocephalic, sclerae anicteric, TMs pearly, nares patent,  no discharge or erythema, pharynx normal Oral cavity: MMM, no lesions Neck: supple, no lymphadenopathy, no thyromegaly, no masses Heart: RRR, normal S1, S2, no murmurs Lungs: CTA bilaterally, no wheezes, rhonchi, or rales Abdomen: +bs, soft, non tender, non distended, no masses, no hepatomegaly, no splenomegaly Musculoskeletal: nontender, no swelling, no obvious deformity Extremities: no edema, no cyanosis, no clubbing Pulses: 2+ symmetric, upper and lower extremities, normal cap refill Neurological: alert, oriented x 3, CN2-12 intact, strength normal upper extremities and lower extremities, sensation normal throughout, DTRs 2+ throughout, no cerebellar signs, gait normal Psychiatric: normal affect, behavior normal, pleasant    Medicare Attestation I have personally reviewed: The patient's medical and social history Their use of alcohol, tobacco or illicit drugs Their current medications and supplements The patient's functional ability including ADLs,fall risks, home safety risks, cognitive, and hearing and visual impairment Diet and physical activities Evidence for depression or mood disorders  The patient's weight, height, BMI, and visual acuity have been recorded in the chart.  I have made referrals, counseling,  and provided education to the patient based on review of the above and I have provided the patient with a written personalized care plan for preventive services.     Darrol Jump, NP   07/26/2022

## 2022-07-27 LAB — CBC WITH DIFFERENTIAL/PLATELET
Absolute Monocytes: 573 cells/uL (ref 200–950)
Basophils Absolute: 20 cells/uL (ref 0–200)
Basophils Relative: 0.5 %
Eosinophils Absolute: 51 cells/uL (ref 15–500)
Eosinophils Relative: 1.3 %
HCT: 40.7 % (ref 35.0–45.0)
Hemoglobin: 14.2 g/dL (ref 11.7–15.5)
Lymphs Abs: 484 cells/uL — ABNORMAL LOW (ref 850–3900)
MCH: 34.4 pg — ABNORMAL HIGH (ref 27.0–33.0)
MCHC: 34.9 g/dL (ref 32.0–36.0)
MCV: 98.5 fL (ref 80.0–100.0)
MPV: 10.5 fL (ref 7.5–12.5)
Monocytes Relative: 14.7 %
Neutro Abs: 2773 cells/uL (ref 1500–7800)
Neutrophils Relative %: 71.1 %
Platelets: 162 10*3/uL (ref 140–400)
RBC: 4.13 10*6/uL (ref 3.80–5.10)
RDW: 12.2 % (ref 11.0–15.0)
Total Lymphocyte: 12.4 %
WBC: 3.9 10*3/uL (ref 3.8–10.8)

## 2022-07-27 LAB — COMPLETE METABOLIC PANEL WITH GFR
AG Ratio: 1.8 (calc) (ref 1.0–2.5)
ALT: 20 U/L (ref 6–29)
AST: 22 U/L (ref 10–35)
Albumin: 4.1 g/dL (ref 3.6–5.1)
Alkaline phosphatase (APISO): 43 U/L (ref 37–153)
BUN: 12 mg/dL (ref 7–25)
CO2: 27 mmol/L (ref 20–32)
Calcium: 8.8 mg/dL (ref 8.6–10.4)
Chloride: 104 mmol/L (ref 98–110)
Creat: 0.97 mg/dL (ref 0.60–1.00)
Globulin: 2.3 g/dL (calc) (ref 1.9–3.7)
Glucose, Bld: 106 mg/dL — ABNORMAL HIGH (ref 65–99)
Potassium: 4 mmol/L (ref 3.5–5.3)
Sodium: 139 mmol/L (ref 135–146)
Total Bilirubin: 0.6 mg/dL (ref 0.2–1.2)
Total Protein: 6.4 g/dL (ref 6.1–8.1)
eGFR: 62 mL/min/{1.73_m2} (ref >=60)

## 2022-07-27 LAB — LIPID PANEL
Cholesterol: 136 mg/dL (ref <200)
HDL: 61 mg/dL (ref >=50)
LDL Cholesterol (Calc): 59 mg/dL (calc)
Non-HDL Cholesterol (Calc): 75 mg/dL (calc) (ref <130)
Total CHOL/HDL Ratio: 2.2 (calc) (ref <5.0)
Triglycerides: 81 mg/dL (ref <150)

## 2022-10-03 ENCOUNTER — Ambulatory Visit
Admission: RE | Admit: 2022-10-03 | Discharge: 2022-10-03 | Disposition: A | Discharge status: Home / Self Care | Payer: Medicare Other | Source: Ambulatory Visit | Attending: Nurse Practitioner | Admitting: Nurse Practitioner

## 2022-10-03 DIAGNOSIS — Z1231 Encounter for screening mammogram for malignant neoplasm of breast: Secondary | ICD-10-CM

## 2022-12-16 ENCOUNTER — Ambulatory Visit (INDEPENDENT_AMBULATORY_CARE_PROVIDER_SITE_OTHER): Payer: Medicare Other | Admitting: Student

## 2022-12-16 ENCOUNTER — Encounter (HOSPITAL_BASED_OUTPATIENT_CLINIC_OR_DEPARTMENT_OTHER): Payer: Self-pay | Admitting: Student

## 2022-12-16 ENCOUNTER — Encounter (HOSPITAL_BASED_OUTPATIENT_CLINIC_OR_DEPARTMENT_OTHER): Payer: Self-pay

## 2022-12-16 ENCOUNTER — Emergency Department (HOSPITAL_BASED_OUTPATIENT_CLINIC_OR_DEPARTMENT_OTHER)
Admission: EM | Admit: 2022-12-16 | Discharge: 2022-12-16 | Disposition: A | Discharge status: Home / Self Care | Payer: Medicare Other | Attending: Emergency Medicine | Admitting: Emergency Medicine

## 2022-12-16 ENCOUNTER — Emergency Department (HOSPITAL_BASED_OUTPATIENT_CLINIC_OR_DEPARTMENT_OTHER): Payer: Medicare Other | Admitting: Radiology

## 2022-12-16 ENCOUNTER — Ambulatory Visit (HOSPITAL_BASED_OUTPATIENT_CLINIC_OR_DEPARTMENT_OTHER): Payer: Medicare Other

## 2022-12-16 ENCOUNTER — Other Ambulatory Visit: Payer: Self-pay

## 2022-12-16 ENCOUNTER — Emergency Department (HOSPITAL_BASED_OUTPATIENT_CLINIC_OR_DEPARTMENT_OTHER): Payer: Medicare Other

## 2022-12-16 DIAGNOSIS — S59911A Unspecified injury of right forearm, initial encounter: Secondary | ICD-10-CM | POA: Diagnosis present

## 2022-12-16 DIAGNOSIS — I129 Hypertensive chronic kidney disease with stage 1 through stage 4 chronic kidney disease, or unspecified chronic kidney disease: Secondary | ICD-10-CM | POA: Insufficient documentation

## 2022-12-16 DIAGNOSIS — Z79899 Other long term (current) drug therapy: Secondary | ICD-10-CM | POA: Insufficient documentation

## 2022-12-16 DIAGNOSIS — Y9373 Activity, racquet and hand sports: Secondary | ICD-10-CM | POA: Diagnosis not present

## 2022-12-16 DIAGNOSIS — S52591A Other fractures of lower end of right radius, initial encounter for closed fracture: Secondary | ICD-10-CM | POA: Diagnosis not present

## 2022-12-16 DIAGNOSIS — M25531 Pain in right wrist: Secondary | ICD-10-CM

## 2022-12-16 DIAGNOSIS — S52531A Colles' fracture of right radius, initial encounter for closed fracture: Secondary | ICD-10-CM | POA: Diagnosis not present

## 2022-12-16 DIAGNOSIS — W2119XA Struck by other bat, racquet or club, initial encounter: Secondary | ICD-10-CM | POA: Insufficient documentation

## 2022-12-16 DIAGNOSIS — N182 Chronic kidney disease, stage 2 (mild): Secondary | ICD-10-CM | POA: Diagnosis not present

## 2022-12-16 DIAGNOSIS — S52599A Other fractures of lower end of unspecified radius, initial encounter for closed fracture: Secondary | ICD-10-CM | POA: Diagnosis not present

## 2022-12-16 MED ORDER — LIDOCAINE HCL 2 % IJ SOLN
20.0000 mL | Freq: Once | INTRAMUSCULAR | Status: AC
  Administered 2022-12-16: 400 mg via INTRADERMAL
  Filled 2022-12-16: qty 20

## 2022-12-16 MED ORDER — OXYCODONE HCL 5 MG PO TABS: 5.0000 mg | ORAL_TABLET | ORAL | 0 refills | Status: DC | PRN

## 2022-12-16 MED ORDER — OXYCODONE HCL 5 MG PO TABS
5.0000 mg | ORAL_TABLET | Freq: Once | ORAL | Status: AC
  Administered 2022-12-16: 5 mg via ORAL
  Filled 2022-12-16: qty 1

## 2022-12-16 NOTE — Discharge Instructions (Signed)
Thank you for coming to [facility] Emergency Department. You were seen for fall with right wrist pain. We did an exam, labs, and imaging, and these showed a distal radius fracture called a Colles' fracture.  This was reduced in the emergency department using nerve blocks, direct traction, and finger traps.  You are placed in a splint.  Please keep the splint clean and dry.  Please rest ice and elevate the right arm in order to help prevent swelling and pain. You can alternate taking Tylenol and ibuprofen as needed for pain. You can take 650mg  tylenol (acetaminophen) every 4-6 hours, and 600 mg ibuprofen 3 times a day. You can also take oxycodone 5 mg every 4-6 hours as needed for severe pain.  Please do not drive or operate heavy machinery when taking this medication.  Please follow up with your orthopedic doctor on Monday.  Do not hesitate to return to the ED or call 911 if you experience: -Worsening symptoms -Numbness/tingling -Lightheadedness, passing out -Fevers/chills -Anything else that concerns you

## 2022-12-16 NOTE — ED Triage Notes (Signed)
Patient here POV from Home.  Endorses Fall today while playing Pickleball today 2 Hours ago. Injured her Right Forearm. Seen today at Orthopedics and sent for Reduction/Splinting.   NAD Noted during Triage. A&Ox4. GCS 15. Ambulatory.

## 2022-12-16 NOTE — Progress Notes (Signed)
Chief Complaint: Right wrist pain     History of Present Illness:    Brittany Hanna is a 72 y.o. female presenting for evaluation of right wrist pain after sustaining an injury today.  She states that she was playing pickle ball this afternoon and was backpedaling when she lost her balance and tried to brace herself.  She has been in significant pain since the injury has been unable to move her wrist.  Some friends of hers were able to give her transportation to be seen in clinic.  Denies any numbness or tingling into the hand or fingers.  No prior history of injury to this area.   Surgical History:   None  PMH/PSH/Family History/Social History/Meds/Allergies:    Past Medical History:  Diagnosis Date   Anxiety    B12 deficiency    FHx: heart disease 10/07/2018   High cholesterol    Hypertension    IBS (irritable bowel syndrome)    Vitamin D deficiency    Past Surgical History:  Procedure Laterality Date   AUGMENTATION MAMMAPLASTY     CATARACT EXTRACTION, BILATERAL Bilateral 2019   Dr. Darel Hong    COLONOSCOPY  03/30/2010   Dr.Stark   TUBAL LIGATION     Social History   Socioeconomic History   Marital status: Widowed    Spouse name: Not on file   Number of children: Not on file   Years of education: Not on file   Highest education level: Not on file  Occupational History   Not on file  Tobacco Use   Smoking status: Former    Packs/day: 0.75    Years: 40.00    Additional pack years: 0.00    Total pack years: 30.00    Types: Cigarettes   Smokeless tobacco: Never  Vaping Use   Vaping Use: Never used  Substance and Sexual Activity   Alcohol use: Yes    Alcohol/week: 3.0 standard drinks of alcohol    Types: 3 Glasses of wine per week    Comment: Occ   Drug use: No   Sexual activity: Not on file  Other Topics Concern   Not on file  Social History Narrative   Not on file   Social Determinants of Health   Financial Resource  Strain: Not on file  Food Insecurity: Not on file  Transportation Needs: Not on file  Physical Activity: Not on file  Stress: Not on file  Social Connections: Not on file   Family History  Problem Relation Age of Onset   Heart failure Mother    Osteoarthritis Father    Hypertension Father    COPD Father    Breast cancer Maternal Aunt    Colon cancer Neg Hx    Esophageal cancer Neg Hx    Stomach cancer Neg Hx    Rectal cancer Neg Hx    Allergies  Allergen Reactions   Lipitor [Atorvastatin] Other (See Comments)    Fatigue.   No current facility-administered medications for this visit.   Current Outpatient Medications  Medication Sig Dispense Refill   ALPRAZolam (XANAX) 0.25 MG tablet Take 0.25 mg by mouth daily as needed for anxiety.     ezetimibe (ZETIA) 10 MG tablet Take  1 tablet  Daily  for Cholesterol 90 tablet 3   promethazine-dextromethorphan (PROMETHAZINE-DM) 6.25-15 MG/5ML  syrup Take 5 mLs by mouth 4 (four) times daily as needed for cough. 240 mL 0   rosuvastatin (CRESTOR) 10 MG tablet Take 1 tablet Daily for Cholesterol 90 tablet 3   Facility-Administered Medications Ordered in Other Visits  Medication Dose Route Frequency Provider Last Rate Last Admin   lidocaine (XYLOCAINE) 2 % (with pres) injection 400 mg  20 mL Intradermal Once Loetta Rough, MD       No results found.  Review of Systems:   A ROS was performed including pertinent positives and negatives as documented in the HPI.  Physical Exam :   Constitutional: NAD and appears stated age Neurological: Alert and oriented Psych: Appropriate affect and cooperative There were no vitals taken for this visit.   Comprehensive Musculoskeletal Exam:    Patient has significant swelling and an obvious deformity noted over her right distal forearm.  This is more prominent in the dorsal aspect and is more radial-sided.  Patient is unable to perform much flexion or extension.  Mild discomfort with palpation of the  radial head and olecranon.  Able to move all 5 fingers.  Distal neurosensory exam is intact.  Imaging:   Xray (right wrist 3 views): There is a distal radius fracture with approximately 30 degrees of dorsal angulation.  I personally reviewed and interpreted the radiographs.   Assessment:   72 y.o. female presenting with right wrist pain after a fall today.  X-ray shows that she does have a distal radius fracture with some dorsal angulation.  I discussed with the patient that this will require close reduction and splinting in order for adequate healing.  In order to have this performed today, we will have to have the patient seen in the emergency department for this procedure.  Patient is agreeable and I have spoken to the emergency department who are aware of the situation.  Will assist in transferring her down to the ED for reduction and will plan to follow-up in the next week for reassessment.  Plan :    -Plan to see patient back in 1 week for reassessment     I personally saw and evaluated the patient, and participated in the management and treatment plan.  Hazle Nordmann, PA-C Orthopedics  This document was dictated using Conservation officer, historic buildings. A reasonable attempt at proof reading has been made to minimize errors.

## 2022-12-16 NOTE — ED Provider Notes (Signed)
[place] EMERGENCY DEPARTMENT AT [facility] Provider Note   CSN: [id number] Arrival date & time: 12/16/22  1611     History  Chief Complaint  Patient presents with   Arm Injury    Brittany Hanna is a 72 y.o. female with HTN, HLD, CKD stage II, overactive bladder, GERD, left bundle branch block, osteoporosis who presents with arm injury.   Patient here POV from Home.   Endorses Fall today while playing Pickleball today 2 Hours ago. Injured her Right Forearm. Seen today at Orthopedics, found to have a distal left radius fracture, and sent for Reduction/Splinting, as there was not an MD in the clinic today.  Denies any numbness tingling.  Has never had surgery on this wrist before.  Did hit her head but does not take any blood thinners, has had no nausea vomiting, did not lose consciousness.  No neck pain.   Arm Injury      Home Medications Prior to Admission medications   Medication Sig Start Date End Date Taking? Authorizing Provider  oxyCODONE (ROXICODONE) 5 MG immediate release tablet Take 1 tablet (5 mg total) by mouth every 4 (four) hours as needed for severe pain. 12/16/22  Yes Loetta Rough, MD  ALPRAZolam Prudy Feeler) 0.25 MG tablet Take 0.25 mg by mouth daily as needed for anxiety.    [provider]  ezetimibe (ZETIA) 10 MG tablet Take  1 tablet  Daily  for Cholesterol 02/15/22   Lucky Cowboy, MD  promethazine-dextromethorphan (PROMETHAZINE-DM) 6.25-15 MG/5ML syrup Take 5 mLs by mouth 4 (four) times daily as needed for cough. 07/26/22   Adela Glimpse, NP  rosuvastatin (CRESTOR) 10 MG tablet Take 1 tablet Daily for Cholesterol 02/15/22   Lucky Cowboy, MD      Allergies    Lipitor [atorvastatin]    Review of Systems   Review of Systems Review of systems Negative for head trauma, LOC.  A 10 point review of systems was performed and is negative unless otherwise reported in HPI.  Physical Exam Updated Vital Signs BP (!) 169/73 (BP Location:  Right Arm)   Pulse 74   Temp 98.1 F (36.7 C) (Temporal)   Resp 18   Ht 5\' 10"  (1.778 m)   Wt 72.6 kg   SpO2 98%   BMI 22.96 kg/m  Physical Exam General: Normal appearing female, lying in bed.  HEENT: Sclera anicteric, MMM, trachea midline.  NCAT.  No midline C-spine tenderness outpatient step-offs or deformities. Cardiology: RRR, no murmurs/rubs/gallops.  Resp: Normal respiratory rate and effort. CTAB, no wheezes, rhonchi, crackles.  Abd: Soft, non-tender, non-distended. No rebound tenderness or guarding.  GU: Deferred. MSK: R wrist deformity, appears dorsally displaced, w/ intact distal neurovascular exam. ROM intact in hand though somewhat limited d/t pain. Good cap refill, good radial pulse.  Skin: warm, dry.  Neuro: A&Ox4, CNs II-XII grossly intact. MAEs. Sensation grossly intact.  Psych: Normal mood and affect.   ED Results / Procedures / Treatments   Labs (all labs ordered are listed, but only abnormal results are displayed) Labs Reviewed - No data to display  EKG None  Radiology DG Wrist Complete Right  Result Date: 12/16/2022 CLINICAL DATA:  Wrist fracture, postreduction. EXAM: RIGHT WRIST - COMPLETE 3+ VIEW COMPARISON:  Radiograph earlier today. FINDINGS: Multiple sequential images of the wrist in frontal and lateral projections were obtained. Frontal and lateral imaging obtained at 7:23 and 7:25 p.m. demonstrates unchanged alignment of the comminuted displaced distal radius fracture. There is involvement of the  distal radiocarpal and radioulnar joints. Carpal bones remain aligned with the dominant distal radius fracture fragment. Small density adjacent to the ulna styloid appears corticated and may be chronic. Lateral view obtained at 7:31 p.m. demonstrates mildly improved alignment of the comminuted distal radius fracture. Frontal and lateral imaging obtained at 801 and 8:03 p.m. demonstrates improved alignment of the comminuted displaced distal radius fracture. There is  generalized subcutaneous edema. IMPRESSION: Improved alignment of the comminuted displaced distal radius fracture postreduction. Electronically Signed   By: Narda Rutherford M.D.   On: 12/16/2022 20:21    Procedures .Nerve Block  Date/Time: 12/16/2022 8:29 PM  Performed by: Loetta Rough, MD Authorized by: Loetta Rough, MD   Consent:    Consent obtained:  Verbal   Consent given by:  Patient   Risks, benefits, and alternatives were discussed: yes     Risks discussed:  Bleeding, intravenous injection, infection, nerve damage, pain, unsuccessful block and swelling   Alternatives discussed:  No treatment Universal protocol:    Procedure explained and questions answered to patient or proxy's satisfaction: yes     Test results available: yes     Immediately prior to procedure, a time out was called: yes     Patient identity confirmed:  Verbally with patient Indications:    Indications:  Procedural anesthesia Location:    Body area:  Upper extremity   Upper extremity nerve:  Radial   Laterality:  Right Pre-procedure details:    Skin preparation:  Chlorhexidine Skin anesthesia:    Skin anesthesia method:  Local infiltration   Local anesthetic:  Lidocaine 2% w/o epi Procedure details:    Block needle gauge:  25 G   Guidance: ultrasound     Anesthetic injected:  Lidocaine 2% w/o epi   Steroid injected:  None   Additive injected:  None   Injection procedure:  Anatomic landmarks identified, incremental injection, negative aspiration for blood, anatomic landmarks palpated and introduced needle   Paresthesia:  None Post-procedure details:    Dressing:  None   Outcome:  Anesthesia achieved   Procedure completion:  Tolerated well, no immediate complications .Nerve Block  Date/Time: 12/16/2022 8:31 PM  Performed by: Loetta Rough, MD Authorized by: Loetta Rough, MD   Consent:    Consent obtained:  Verbal   Consent given by:  Patient   Risks, benefits, and alternatives were  discussed: yes     Risks discussed:  Allergic reaction, bleeding, intravenous injection, infection, nerve damage, unsuccessful block, swelling and pain   Alternatives discussed:  No treatment and alternative treatment Universal protocol:    Procedure explained and questions answered to patient or proxy's satisfaction: yes     Imaging studies available: yes     Immediately prior to procedure, a time out was called: yes     Patient identity confirmed:  Verbally with patient Location:    Body area:  Upper extremity   Upper extremity nerve blocked: median.   Laterality:  Right Pre-procedure details:    Skin preparation:  Chlorhexidine Skin anesthesia:    Skin anesthesia method:  Local infiltration   Local anesthetic:  Lidocaine 2% w/o epi Procedure details:    Block needle gauge:  25 G   Guidance: ultrasound     Anesthetic injected:  Lidocaine 2% w/o epi   Steroid injected:  None   Additive injected:  None   Injection procedure:  Anatomic landmarks identified, anatomic landmarks palpated, introduced needle, incremental injection and negative aspiration for blood  Paresthesia:  None Post-procedure details:    Dressing:  None   Outcome:  Anesthesia achieved   Procedure completion:  Tolerated well, no immediate complications Reduction of fracture  Date/Time: 12/16/2022 8:32 PM  Performed by: Loetta Rough, MD Authorized by: Loetta Rough, MD  Consent: Verbal consent obtained. Risks and benefits: risks, benefits and alternatives were discussed Consent given by: patient Patient understanding: patient states understanding of the procedure being performed Relevant documents: relevant documents present and verified Site marked: the operative site was marked Imaging studies: imaging studies available Required items: required blood products, implants, devices, and special equipment available Patient identity confirmed: verbally with patient Time out: Immediately prior to procedure a  "time out" was called to verify the correct patient, procedure, equipment, support staff and site/side marked as required. Local anesthesia used: yes Anesthesia: nerve block and hematoma block  Anesthesia: Local anesthesia used: yes Local Anesthetic: lidocaine 2% without epinephrine Anesthetic total: 15 mL  Sedation: Patient sedated: no  Patient tolerance: patient tolerated the procedure well with no immediate complications Comments: Two attempts of reduction with with direct traction unsuccessful       Medications Ordered in ED Medications  lidocaine (XYLOCAINE) 2 % (with pres) injection 400 mg (400 mg Intradermal Given 12/16/22 1755)  oxyCODONE (Oxy IR/ROXICODONE) immediate release tablet 5 mg (5 mg Oral Given 12/16/22 1753)    ED Course/ Medical Decision Making/ A&P                          Medical Decision Making Amount and/or Complexity of Data Reviewed Radiology: ordered.  Risk Prescription drug management.    This patient presents to the ED for concern of FOOSH w/ distal radius fx, this involves an extensive number of treatment options, and is a complaint that carries with it a high risk of complications and morbidity.    MDM:    XR reviewed from ortho clinic this afternoon reveal a R Colle's fracture with significant dorsal displacement/angulation. Patient is NVI, no c/f compartment syndrome or neurovascular injury at this time.  Performed radial and median nerve blocks with lido 2% without epinephrine as well as a hematoma block, total lidocaine used was 15 mL (300 mg, patient is 72 kg which is 4.1 mg/kg) and patient was on the monitor during the procedure.  Direct and live ultrasound guidance was used during injection of the lidocaine.  Patient had good anesthesia and median and radial nerve distributions and hematoma block added for extra pain control.  Direct traction did not provide good reduction of the patient's angulated fracture, however finger traps hung with  2 L normal saline bags did provide much better reduction.  Patient splinted with good neurovascular exam after splint and reduction.  She is given a sling, instructed to take Tylenol ibuprofen, RICE, and given oxycodone as needed for severe pain.  Instructed to call orthopedic surgery and follow-up on Monday.  Clinical Course as of 12/16/22 2029  Fri Dec 16, 2022  1610 Two attempts at distal radius reduction unsuccessful by direct traction with good pain control using nerve blocks. Will hang in finger traps w/ 2 saline bags and reassess in 20 min [HN]  2008 Improved alignment after finger traps Will splint with sugar tong and give sling, instructed to f/u with orthopedic surgery on Monday [HN]    Clinical Course User Index [HN] Loetta Rough, MD     Imaging Studies ordered: I ordered imaging studies including R wrist  post-reduction film I independently visualized and interpreted imaging. I agree with the radiologist interpretation  Additional history obtained from chart review.    Cardiac Monitoring: The patient was maintained on a cardiac monitor.  I personally viewed and interpreted the cardiac monitored which showed an underlying rhythm of: NSR  Reevaluation: After the interventions noted above, I reevaluated the patient and found that they have :improved  Social Determinants of Health: Patient lives independently   Disposition:  DC w/ discharge instructions/return precautions. All questions answered to patient's satisfaction.    Co morbidities that complicate the patient evaluation  Past Medical History:  Diagnosis Date   Anxiety    B12 deficiency    FHx: heart disease 10/07/2018   High cholesterol    Hypertension    IBS (irritable bowel syndrome)    Vitamin D deficiency      Medicines Meds ordered this encounter  Medications   lidocaine (XYLOCAINE) 2 % (with pres) injection 400 mg   oxyCODONE (Oxy IR/ROXICODONE) immediate release tablet 5 mg   oxyCODONE  (ROXICODONE) 5 MG immediate release tablet    Sig: Take 1 tablet (5 mg total) by mouth every 4 (four) hours as needed for severe pain.    Dispense:  30 tablet    Refill:  0    I have reviewed the patients home medicines and have made adjustments as needed  Problem List / ED Course: Problem List Items Addressed This Visit   None Visit Diagnoses     Closed Colles' fracture of right radius, initial encounter    -  Primary                   This note was created using dictation software, which may contain spelling or grammatical errors.    Loetta Rough, MD 12/16/22 2035

## 2022-12-21 ENCOUNTER — Ambulatory Visit (INDEPENDENT_AMBULATORY_CARE_PROVIDER_SITE_OTHER): Payer: Medicare Other | Admitting: Orthopaedic Surgery

## 2022-12-21 ENCOUNTER — Encounter (HOSPITAL_BASED_OUTPATIENT_CLINIC_OR_DEPARTMENT_OTHER): Payer: Self-pay | Admitting: Student

## 2022-12-21 ENCOUNTER — Ambulatory Visit (HOSPITAL_BASED_OUTPATIENT_CLINIC_OR_DEPARTMENT_OTHER): Payer: Medicare Other

## 2022-12-21 DIAGNOSIS — S52599A Other fractures of lower end of unspecified radius, initial encounter for closed fracture: Secondary | ICD-10-CM | POA: Diagnosis not present

## 2022-12-21 DIAGNOSIS — M25531 Pain in right wrist: Secondary | ICD-10-CM

## 2022-12-21 DIAGNOSIS — S52501A Unspecified fracture of the lower end of right radius, initial encounter for closed fracture: Secondary | ICD-10-CM | POA: Diagnosis not present

## 2022-12-21 NOTE — Progress Notes (Signed)
Chief Complaint: Right wrist pain        History of Present Illness:    12/21/2022: Presents today for follow-up of her right wrist.  She is status post closed reduction and splinting in the emergency room.  She is here today for further discussion.  Brittany Hanna is a 72 y.o. female presenting for evaluation of right wrist pain after sustaining an injury today.  She states that she was playing pickle ball this afternoon and was backpedaling when she lost her balance and tried to brace herself.  She has been in significant pain since the injury has been unable to move her wrist.  Some friends of hers were able to give her transportation to be seen in clinic.  Denies any numbness or tingling into the hand or fingers.  No prior history of injury to this area.     Surgical History:   None   PMH/PSH/Family History/Social History/Meds/Allergies:         Past Medical History:  Diagnosis Date   Anxiety     B12 deficiency     FHx: heart disease 10/07/2018   High cholesterol     Hypertension     IBS (irritable bowel syndrome)     Vitamin D deficiency           Past Surgical History:  Procedure Laterality Date   AUGMENTATION MAMMAPLASTY       CATARACT EXTRACTION, BILATERAL Bilateral 2019    Dr. Darel Hong    COLONOSCOPY   03/30/2010    Dr.Stark   TUBAL LIGATION        Social History         Socioeconomic History   Marital status: Widowed      Spouse name: Not on file   Number of children: Not on file   Years of education: Not on file   Highest education level: Not on file  Occupational History   Not on file  Tobacco Use   Smoking status: Former      Packs/day: 0.75      Years: 40.00      Additional pack years: 0.00      Total pack years: 30.00      Types: Cigarettes   Smokeless tobacco: Never  Vaping Use   Vaping Use: Never used  Substance and Sexual Activity   Alcohol use: Yes      Alcohol/week: 3.0 standard drinks of alcohol      Types: 3 Glasses of  wine per week      Comment: Occ   Drug use: No   Sexual activity: Not on file  Other Topics Concern   Not on file  Social History Narrative   Not on file    Social Determinants of Health    Financial Resource Strain: Not on file  Food Insecurity: Not on file  Transportation Needs: Not on file  Physical Activity: Not on file  Stress: Not on file  Social Connections: Not on file         Family History  Problem Relation Age of Onset   Heart failure Mother     Osteoarthritis Father     Hypertension Father     COPD Father     Breast cancer Maternal Aunt     Colon cancer Neg Hx     Esophageal cancer Neg Hx     Stomach cancer Neg Hx     Rectal cancer Neg Hx  Allergies  Allergen Reactions   Lipitor [Atorvastatin] Other (See Comments)      Fatigue.    No current facility-administered medications for this visit.          Current Outpatient Medications  Medication Sig Dispense Refill   ALPRAZolam (XANAX) 0.25 MG tablet Take 0.25 mg by mouth daily as needed for anxiety.       ezetimibe (ZETIA) 10 MG tablet Take  1 tablet  Daily  for Cholesterol 90 tablet 3   promethazine-dextromethorphan (PROMETHAZINE-DM) 6.25-15 MG/5ML syrup Take 5 mLs by mouth 4 (four) times daily as needed for cough. 240 mL 0   rosuvastatin (CRESTOR) 10 MG tablet Take 1 tablet Daily for Cholesterol 90 tablet 3             Facility-Administered Medications Ordered in Other Visits  Medication Dose Route Frequency Provider Last Rate Last Admin   lidocaine (XYLOCAINE) 2 % (with pres) injection 400 mg  20 mL Intradermal Once Loetta Rough, MD        Imaging Results (Last 48 hours)  No results found.     Review of Systems:   A ROS was performed including pertinent positives and negatives as documented in the HPI.   Physical Exam :   Constitutional: NAD and appears stated age Neurological: Alert and oriented Psych: Appropriate affect and cooperative There were no vitals taken for this visit.     Comprehensive Musculoskeletal Exam:     Right splint is clean dry and intact.  Fires AIN PIN as well as interosseous.  Sensation intact all distributions of the right finger.  Fingers are warm well-perfused   Imaging:   Xray (right wrist 3 views): There is a distal radius fracture with minimal angulation   I personally reviewed and interpreted the radiographs.     Assessment:   72 y.o. female presenting with right distal radius fracture after fall.  She is status post closed reduction with good alignment in her splint.  At this time we will plan for an additional 2 weeks of splint and I will see her back at that point with cast placement.  I did discuss risks and benefits of surgical intervention versus continued close management.  At this time I do believe given her alignment is quite good that she would likely continue to do well with conservative management.  Plan to see her back in 2 weeks   Plan :     -Plan to see patient back in 2 week for reassessment         I personally saw and evaluated the patient, and participated in the management and treatment plan.     This document was dictated using Conservation officer, historic buildings. A reasonable attempt at proof reading has been made to minimize errors.

## 2023-01-04 ENCOUNTER — Ambulatory Visit (INDEPENDENT_AMBULATORY_CARE_PROVIDER_SITE_OTHER): Payer: Medicare Other | Admitting: Student

## 2023-01-04 ENCOUNTER — Ambulatory Visit (INDEPENDENT_AMBULATORY_CARE_PROVIDER_SITE_OTHER): Payer: Medicare Other

## 2023-01-04 DIAGNOSIS — M25531 Pain in right wrist: Secondary | ICD-10-CM

## 2023-01-04 DIAGNOSIS — S52599A Other fractures of lower end of unspecified radius, initial encounter for closed fracture: Secondary | ICD-10-CM

## 2023-01-04 DIAGNOSIS — S52501D Unspecified fracture of the lower end of right radius, subsequent encounter for closed fracture with routine healing: Secondary | ICD-10-CM | POA: Diagnosis not present

## 2023-01-04 NOTE — Progress Notes (Signed)
Chief Complaint: Right wrist pain        History of Present Illness:    01/04/2023: Presenting today for right wrist follow-up.  She has remained in the splint with no complications.  Overall her pain at rest is minimal but does increase with certain movements.  She has been noticing some increased pain on the ulnar side of her wrist.  She is not requiring any pain medication.  Brittany Hanna is a 72 y.o. female presenting for evaluation of right wrist pain after sustaining an injury today.  She states that she was playing pickle ball this afternoon and was backpedaling when she lost her balance and tried to brace herself.  She has been in significant pain since the injury has been unable to move her wrist.  Some friends of hers were able to give her transportation to be seen in clinic.  Denies any numbness or tingling into the hand or fingers.  No prior history of injury to this area.     Surgical History:   None   PMH/PSH/Family History/Social History/Meds/Allergies:         Past Medical History:  Diagnosis Date   Anxiety     B12 deficiency     FHx: heart disease 10/07/2018   High cholesterol     Hypertension     IBS (irritable bowel syndrome)     Vitamin D deficiency           Past Surgical History:  Procedure Laterality Date   AUGMENTATION MAMMAPLASTY       CATARACT EXTRACTION, BILATERAL Bilateral 2019    Dr. Darel Hong    COLONOSCOPY   03/30/2010    Dr.Stark   TUBAL LIGATION        Social History         Socioeconomic History   Marital status: Widowed      Spouse name: Not on file   Number of children: Not on file   Years of education: Not on file   Highest education level: Not on file  Occupational History   Not on file  Tobacco Use   Smoking status: Former      Packs/day: 0.75      Years: 40.00      Additional pack years: 0.00      Total pack years: 30.00      Types: Cigarettes   Smokeless tobacco: Never  Vaping Use   Vaping Use: Never used   Substance and Sexual Activity   Alcohol use: Yes      Alcohol/week: 3.0 standard drinks of alcohol      Types: 3 Glasses of wine per week      Comment: Occ   Drug use: No   Sexual activity: Not on file  Other Topics Concern   Not on file  Social History Narrative   Not on file    Social Determinants of Health    Financial Resource Strain: Not on file  Food Insecurity: Not on file  Transportation Needs: Not on file  Physical Activity: Not on file  Stress: Not on file  Social Connections: Not on file         Family History  Problem Relation Age of Onset   Heart failure Mother     Osteoarthritis Father     Hypertension Father     COPD Father     Breast cancer Maternal Aunt     Colon cancer Neg Hx     Esophageal cancer  Neg Hx     Stomach cancer Neg Hx     Rectal cancer Neg Hx           Allergies  Allergen Reactions   Lipitor [Atorvastatin] Other (See Comments)      Fatigue.    No current facility-administered medications for this visit.          Current Outpatient Medications  Medication Sig Dispense Refill   ALPRAZolam (XANAX) 0.25 MG tablet Take 0.25 mg by mouth daily as needed for anxiety.       ezetimibe (ZETIA) 10 MG tablet Take  1 tablet  Daily  for Cholesterol 90 tablet 3   promethazine-dextromethorphan (PROMETHAZINE-DM) 6.25-15 MG/5ML syrup Take 5 mLs by mouth 4 (four) times daily as needed for cough. 240 mL 0   rosuvastatin (CRESTOR) 10 MG tablet Take 1 tablet Daily for Cholesterol 90 tablet 3             Facility-Administered Medications Ordered in Other Visits  Medication Dose Route Frequency Provider Last Rate Last Admin   lidocaine (XYLOCAINE) 2 % (with pres) injection 400 mg  20 mL Intradermal Once Loetta Rough, MD        Imaging Results (Last 48 hours)  No results found.     Review of Systems:   A ROS was performed including pertinent positives and negatives as documented in the HPI.   Physical Exam :   Constitutional: NAD and appears  stated age Neurological: Alert and oriented Psych: Appropriate affect and cooperative There were no vitals taken for this visit.    Comprehensive Musculoskeletal Exam:     Splint remains intact and well-positioned.  Sensation to all the fingers of the right hand is equal and intact.  All digits are warm and well-perfused.  Normal sensation and perfusion post cast placement.    Imaging:   Xray (right wrist 3 views): There is a distal radius fracture with minimal angulation unchanged from last x-ray.   I personally reviewed and interpreted the radiographs.     Assessment:   72 y.o. female for follow-up of distal radius fracture.  Overall she is doing very well and reports mild pain levels.  Fracture remains unchanged on x-ray and therefore I would like to proceed with splint removal and cast placement.  Short arm cast was applied in clinic today without complication.  Patient has been out of work since her injury and I will plan to write her out for at least another 1.5 weeks.  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 Plan to see her back in 4 weeks for reevaluation and possible transition to removable splint.    Plan :     -Return to clinic in 4 weeks         I personally saw and evaluated the patient, and participated in the management and treatment plan.     This document was dictated using Conservation officer, historic buildings. A reasonable attempt at proof reading has been made to minimize errors.

## 2023-01-06 DIAGNOSIS — S52501S Unspecified fracture of the lower end of right radius, sequela: Secondary | ICD-10-CM | POA: Diagnosis not present

## 2023-01-09 ENCOUNTER — Encounter (HOSPITAL_BASED_OUTPATIENT_CLINIC_OR_DEPARTMENT_OTHER): Payer: Self-pay

## 2023-01-13 ENCOUNTER — Encounter (HOSPITAL_BASED_OUTPATIENT_CLINIC_OR_DEPARTMENT_OTHER): Payer: Self-pay | Admitting: Student

## 2023-01-13 ENCOUNTER — Ambulatory Visit (HOSPITAL_BASED_OUTPATIENT_CLINIC_OR_DEPARTMENT_OTHER): Payer: Medicare Other

## 2023-01-13 ENCOUNTER — Ambulatory Visit (INDEPENDENT_AMBULATORY_CARE_PROVIDER_SITE_OTHER): Payer: Medicare Other | Admitting: Student

## 2023-01-13 DIAGNOSIS — S52599A Other fractures of lower end of unspecified radius, initial encounter for closed fracture: Secondary | ICD-10-CM

## 2023-01-13 DIAGNOSIS — M25531 Pain in right wrist: Secondary | ICD-10-CM

## 2023-01-13 DIAGNOSIS — Z0389 Encounter for observation for other suspected diseases and conditions ruled out: Secondary | ICD-10-CM | POA: Diagnosis not present

## 2023-01-13 NOTE — Progress Notes (Signed)
Chief Complaint: Right wrist pain        History of Present Illness:    01/13/2023: Patient presents today for follow-up.  She did have a short arm cast placed at last visit on Wednesday 5/1.  She reportedly began having significant increases of pain the next day and subsequently went to urgent care last Friday for evaluation of severe pain.  At that point she had her cast removed and was placed in a sugar-tong splint.  After cast removal her pain quickly improved and she is here today for further evaluation   Brittany Hanna is a 72 y.o. female presenting for evaluation of right wrist pain after sustaining an injury today.  She states that she was playing pickle ball this afternoon and was backpedaling when she lost her balance and tried to brace herself.  She has been in significant pain since the injury has been unable to move her wrist.  Some friends of hers were able to give her transportation to be seen in clinic.  Denies any numbness or tingling into the hand or fingers.  No prior history of injury to this area.     Surgical History:   None   PMH/PSH/Family History/Social History/Meds/Allergies:         Past Medical History:  Diagnosis Date   Anxiety     B12 deficiency     FHx: heart disease 10/07/2018   High cholesterol     Hypertension     IBS (irritable bowel syndrome)     Vitamin D deficiency           Past Surgical History:  Procedure Laterality Date   AUGMENTATION MAMMAPLASTY       CATARACT EXTRACTION, BILATERAL Bilateral 2019    Dr. Darel Hong    COLONOSCOPY   03/30/2010    Dr.Stark   TUBAL LIGATION        Social History         Socioeconomic History   Marital status: Widowed      Spouse name: Not on file   Number of children: Not on file   Years of education: Not on file   Highest education level: Not on file  Occupational History   Not on file  Tobacco Use   Smoking status: Former      Packs/day: 0.75      Years: 40.00      Additional  pack years: 0.00      Total pack years: 30.00      Types: Cigarettes   Smokeless tobacco: Never  Vaping Use   Vaping Use: Never used  Substance and Sexual Activity   Alcohol use: Yes      Alcohol/week: 3.0 standard drinks of alcohol      Types: 3 Glasses of wine per week      Comment: Occ   Drug use: No   Sexual activity: Not on file  Other Topics Concern   Not on file  Social History Narrative   Not on file    Social Determinants of Health    Financial Resource Strain: Not on file  Food Insecurity: Not on file  Transportation Needs: Not on file  Physical Activity: Not on file  Stress: Not on file  Social Connections: Not on file         Family History  Problem Relation Age of Onset   Heart failure Mother     Osteoarthritis Father     Hypertension Father  COPD Father     Breast cancer Maternal Aunt     Colon cancer Neg Hx     Esophageal cancer Neg Hx     Stomach cancer Neg Hx     Rectal cancer Neg Hx           Allergies  Allergen Reactions   Lipitor [Atorvastatin] Other (See Comments)      Fatigue.    No current facility-administered medications for this visit.          Current Outpatient Medications  Medication Sig Dispense Refill   ALPRAZolam (XANAX) 0.25 MG tablet Take 0.25 mg by mouth daily as needed for anxiety.       ezetimibe (ZETIA) 10 MG tablet Take  1 tablet  Daily  for Cholesterol 90 tablet 3   promethazine-dextromethorphan (PROMETHAZINE-DM) 6.25-15 MG/5ML syrup Take 5 mLs by mouth 4 (four) times daily as needed for cough. 240 mL 0   rosuvastatin (CRESTOR) 10 MG tablet Take 1 tablet Daily for Cholesterol 90 tablet 3             Facility-Administered Medications Ordered in Other Visits  Medication Dose Route Frequency Provider Last Rate Last Admin   lidocaine (XYLOCAINE) 2 % (with pres) injection 400 mg  20 mL Intradermal Once Loetta Rough, MD        Imaging Results (Last 48 hours)  No results found.     Review of Systems:   A ROS was  performed including pertinent positives and negatives as documented in the HPI.   Physical Exam :   Constitutional: NAD and appears stated age Neurological: Alert and oriented Psych: Appropriate affect and cooperative There were no vitals taken for this visit.    Comprehensive Musculoskeletal Exam:     Patient is resting comfortably in the sugar-tong splint.  Upon removal of the splint her right wrist appears swollen but without deformity.  She does have notable ulnar-sided distal forearm pain with movement and palpation.  Radial pulse 2+.  Sensation to hand and fingers equal and intact.  Fingers warm and well-perfused.    Imaging:   Xray (right wrist 3 views): Distal radius fracture that is well aligned   I personally reviewed and interpreted the radiographs.     Assessment:   72 y.o. female with distal radius fracture.  We discussed that some of the symptoms she was having last week could have been early signs of a potential cast induced compartment syndrome.  She did get good relief with cast removal and has good perfusion and sensation throughout the wrist and hand.  I would like to continue immobilization with a removable splint as opposed to recasting.  She does have a plan follow-up in about 2.5 weeks at which time we can reevaluate.  Recommended NSAIDS and to remain in the splint at all times for the next few weeks.  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    Plan :     -Return to clinic in 2.5 weeks for reassessment         I personally saw and evaluated the patient, and participated in the management and treatment plan.     This document was dictated using Conservation officer, historic buildings. A reasonable attempt at proof reading has been made to minimize errors.

## 2023-02-01 ENCOUNTER — Encounter (HOSPITAL_BASED_OUTPATIENT_CLINIC_OR_DEPARTMENT_OTHER): Payer: Self-pay | Admitting: Student

## 2023-02-01 ENCOUNTER — Ambulatory Visit (INDEPENDENT_AMBULATORY_CARE_PROVIDER_SITE_OTHER): Payer: Medicare Other | Admitting: Student

## 2023-02-01 ENCOUNTER — Ambulatory Visit (HOSPITAL_BASED_OUTPATIENT_CLINIC_OR_DEPARTMENT_OTHER): Payer: Medicare Other

## 2023-02-01 DIAGNOSIS — S52599A Other fractures of lower end of unspecified radius, initial encounter for closed fracture: Secondary | ICD-10-CM

## 2023-02-01 DIAGNOSIS — S52501D Unspecified fracture of the lower end of right radius, subsequent encounter for closed fracture with routine healing: Secondary | ICD-10-CM | POA: Diagnosis not present

## 2023-02-01 NOTE — Progress Notes (Signed)
Chief Complaint: Right wrist pain        History of Present Illness:    02/01/2023: Patient presents today for follow-up.  She states that she has seen some improvement since last visit however discontinue pain in the wrist particularly after activity.  Does also occasionally have sharp pain in the wrist during rest.  She has been wearing the removable wrist brace and tolerating well.  Still notes some occasional swelling.  Has been out of work however she feels like she is ready to return as tolerated.  Does mostly computer work.   Meredith A Hiraoka is a 72 y.o. female presenting for evaluation of right wrist pain after sustaining an injury today.  She states that she was playing pickle ball this afternoon and was backpedaling when she lost her balance and tried to brace herself.  She has been in significant pain since the injury has been unable to move her wrist.  Some friends of hers were able to give her transportation to be seen in clinic.  Denies any numbness or tingling into the hand or fingers.  No prior history of injury to this area.     Surgical History:   None   PMH/PSH/Family History/Social History/Meds/Allergies:         Past Medical History:  Diagnosis Date   Anxiety     B12 deficiency     FHx: heart disease 10/07/2018   High cholesterol     Hypertension     IBS (irritable bowel syndrome)     Vitamin D deficiency           Past Surgical History:  Procedure Laterality Date   AUGMENTATION MAMMAPLASTY       CATARACT EXTRACTION, BILATERAL Bilateral 2019    Dr. Darel Hong    COLONOSCOPY   03/30/2010    Dr.Stark   TUBAL LIGATION        Social History         Socioeconomic History   Marital status: Widowed      Spouse name: Not on file   Number of children: Not on file   Years of education: Not on file   Highest education level: Not on file  Occupational History   Not on file  Tobacco Use   Smoking status: Former      Packs/day: 0.75      Years:  40.00      Additional pack years: 0.00      Total pack years: 30.00      Types: Cigarettes   Smokeless tobacco: Never  Vaping Use   Vaping Use: Never used  Substance and Sexual Activity   Alcohol use: Yes      Alcohol/week: 3.0 standard drinks of alcohol      Types: 3 Glasses of wine per week      Comment: Occ   Drug use: No   Sexual activity: Not on file  Other Topics Concern   Not on file  Social History Narrative   Not on file    Social Determinants of Health    Financial Resource Strain: Not on file  Food Insecurity: Not on file  Transportation Needs: Not on file  Physical Activity: Not on file  Stress: Not on file  Social Connections: Not on file         Family History  Problem Relation Age of Onset   Heart failure Mother     Osteoarthritis Father     Hypertension Father  COPD Father     Breast cancer Maternal Aunt     Colon cancer Neg Hx     Esophageal cancer Neg Hx     Stomach cancer Neg Hx     Rectal cancer Neg Hx           Allergies  Allergen Reactions   Lipitor [Atorvastatin] Other (See Comments)      Fatigue.    No current facility-administered medications for this visit.          Current Outpatient Medications  Medication Sig Dispense Refill   ALPRAZolam (XANAX) 0.25 MG tablet Take 0.25 mg by mouth daily as needed for anxiety.       ezetimibe (ZETIA) 10 MG tablet Take  1 tablet  Daily  for Cholesterol 90 tablet 3   promethazine-dextromethorphan (PROMETHAZINE-DM) 6.25-15 MG/5ML syrup Take 5 mLs by mouth 4 (four) times daily as needed for cough. 240 mL 0   rosuvastatin (CRESTOR) 10 MG tablet Take 1 tablet Daily for Cholesterol 90 tablet 3             Facility-Administered Medications Ordered in Other Visits  Medication Dose Route Frequency Provider Last Rate Last Admin   lidocaine (XYLOCAINE) 2 % (with pres) injection 400 mg  20 mL Intradermal Once Loetta Rough, MD        Imaging Results (Last 48 hours)  No results found.     Review of  Systems:   A ROS was performed including pertinent positives and negatives as documented in the HPI.   Physical Exam :   Constitutional: NAD and appears stated age Neurological: Alert and oriented Psych: Appropriate affect and cooperative There were no vitals taken for this visit.    Comprehensive Musculoskeletal Exam:     Patient is in right removable wrist splint.  Lacking about 10 degrees of extension with active elbow range of motion.  Some tenderness over dorsal wrist with mild swelling.  Still unable to form a full fist with strength.  Active wrist range of motion 20 degrees flexion and extension.  Slight apprehension noted with active pronation and supination.  Distal neurosensory exam intact.    Imaging:   Xray (right wrist 3 views): Distal radius fracture that is well aligned with signs of healing   I personally reviewed and interpreted the radiographs.     Assessment:   72 y.o. female 6 weeks status post distal radius fracture.  Overall she is progressing well and x-rays continue to show progressive signs of healing.  She has been doing well in the wrist splint and has continued to limit pronation and supination.  I would like her to begin resuming normal work activities in the splint as tolerated and then when this is more comfortable, can begin weaning out of the splint unless performing more demanding physical activity.  Would like to plan on seeing her back for final recheck and x-ray in 8 weeks.    Plan :     -Return to clinic in 8 weeks for reassessment         I personally saw and evaluated the patient, and participated in the management and treatment plan.     This document was dictated using Conservation officer, historic buildings. A reasonable attempt at proof reading has been made to minimize errors.

## 2023-02-15 NOTE — Progress Notes (Signed)
Comprehensive Evaluation &  Examination   Future Appointments  Date Time Provider Department  02/16/2023  3:00 PM Lucky Cowboy, MD [facility]  03/29/2023 10:00 AM Amador Cunas, PA-C [facility]  02/28/2024  3:00 PM Lucky Cowboy, MD [facility]        This very nice 72 y.o.  WWF with  HTN, HLD, Prediabetes  and Vitamin D Deficiency  presents for a  comprehensive evaluation and management of multiple medical co-morbidities.  Abd CT scan  in 2019 did show Aortic Atherosclerosis. Patient has hx/o a low Vit B12 level of "226" in the past .                                                    Patient has COPD consequent of a smoking hx/o 1 ppd x 40 + years. Due to  her long history of smoking over 40+ years,  I discussed lung cancer screening with her. She had a NEGATIVE LD Screening CT scan on 08/13/21 and Radiologist recommended continual F/U.    She was referred for recommended f/u in 2023,  but did not follow through .  We discussed smoking cessation techniques/options. I will refer  her  again for a LDCT lung scan          Labile HTN predates since 2005.  In 2015, patient had a negative Cardiolite.   Patient's BP has been controlled at home and patient denies any cardiac symptoms as chest pain, palpitations, shortness of breath, dizziness or ankle swelling. Today's BP is at goal  -  127/72 .       Patient's hyperlipidemia is  controlled with diet  & Rosuvastatin.   Lab Results  Component Value Date   CHOL 136 07/26/2022   HDL 61 07/26/2022   LDLCALC 59 07/26/2022   TRIG 81 07/26/2022   CHOLHDL 2.2 07/26/2022         Patient has been monitored expectantly for glucose intolerance and in 2017 had an elevated insulin level of 27+.    Patient denies reactive hypoglycemic symptoms, visual blurring, diabetic polys or paresthesias. Last A1c was normal & at goal:  Lab Results  Component Value Date   HGBA1C 4.8 02/14/2022         Finally, patient has history of Vitamin D  Deficiency ("10" /2008) and last Vitamin D was still low (goal 70-100):  Lab Results  Component Value Date   VD25OH 32 02/14/2022       Current Outpatient Medications  Medication Instructions   ALPRAZolam (XANAX) 0.25 mg, Oral, Daily PRN     rosuvastatin (CRESTOR) 10 MG tablet Take 1 tablet Daily for Cholesterol     Allergies  Allergen Reactions   Lipitor [Atorvastatin] Fatigue.    Past Medical History:  Diagnosis Date   B12 deficiency    FHx: heart disease 10/07/2018   High cholesterol    Hypertension    IBS (irritable bowel syndrome)    Vitamin D deficiency     Health Maintenance  Topic Date Due   MAMMOGRAM  01/25/2013   DEXA SCAN  Never done   COLONOSCOPY  03/30/2020   COVID-19 Vaccine (2 - Pfizer 3-dose series) 05/07/2020   INFLUENZA VACCINE  04/05/2021   PNA vac Low Risk Adult (2 of 2 - PPSV23) 06/01/2021   TETANUS/TDAP  01/25/2023  Hepatitis C Screening  Completed   HPV VACCINES  Aged Out    Immunization History  Administered Date(s) Administered   Fluad Quad(high Dose 65+) 07/10/2020   Influenza, High Dose   06/29/2018, 06/28/2019   PFIZER  SARS-COV-2 Vacci  04/16/2020   PPD Test 03/19/2014, 03/24/2015, 06/01/2016   Pneumococcal -13 07/24/2017   Pneumococcal  -23 06/01/2016   Tdap 01/24/2013   Zoster 11/03/2013    - Colon - 03/30/2010 - Dr Russella Dar   - Colon - 02/21/2022  - Recc f/u 3 years - due June 2026  Last MGM - 10/04/2022  Last dexaBMD  - 01/07/2022  Past Surgical History:  Procedure Laterality Date   CATARACT EXTRACTION, BILATERAL Bilateral 2019   Dr. Darel Hong    TUBAL LIGATION       Family History  Problem Relation Age of Onset   Heart failure Mother    Osteoarthritis Father    Hypertension Father    COPD Father      Social History   Tobacco Use   Smoking status: Current Some Day Smoker    Packs/day: 1.00    Years: 40.00    Pack years: 40.00    Types: Cigarettes   Smokeless tobacco: Never Used  Substance Use Topics    Alcohol use: Yes    Comment: occasionally   Drug use: No      ROS Constitutional: Denies fever, chills, weight loss/gain, headaches, insomnia,  night sweats, and change in appetite. Does c/o fatigue. Eyes: Denies redness, blurred vision, diplopia, discharge, itchy, watery eyes.  ENT: Denies discharge, congestion, post nasal drip, epistaxis, sore throat, earache, hearing loss, dental pain, Tinnitus, Vertigo, Sinus pain, snoring.  Cardio: Denies chest pain, palpitations, irregular heartbeat, syncope, dyspnea, diaphoresis, orthopnea, PND, claudication, edema Respiratory: denies cough, dyspnea, DOE, pleurisy, hoarseness, laryngitis, wheezing.  Gastrointestinal: Denies dysphagia, heartburn, reflux, water brash, pain, cramps, nausea, vomiting, bloating, diarrhea, constipation, hematemesis, melena, hematochezia, jaundice, hemorrhoids Genitourinary: Denies dysuria, frequency, urgency, nocturia, hesitancy, discharge, hematuria, flank pain Breast: Breast lumps, nipple discharge, bleeding.  Musculoskeletal: Denies arthralgia, myalgia, stiffness, Jt. Swelling, pain, limp, and strain/sprain. Denies falls. Skin: Denies puritis, rash, hives, warts, acne, eczema, changing in skin lesion Neuro: No weakness, tremor, incoordination, spasms, paresthesia, pain Psychiatric: Denies confusion, memory loss, sensory loss. Denies Depression. Endocrine: Denies change in weight, skin, hair change, nocturia, and paresthesia, diabetic polys, visual blurring, hyper / hypo glycemic episodes.  Heme/Lymph: No excessive bleeding, bruising, enlarged lymph nodes.  Physical Exam  BP 127/72   Pulse 72   Temp 97.9 F (36.6 C)   Resp 16   Ht 5\' 10"  (1.778 m)   Wt 176 lb (79.8 kg)   SpO2 99%   BMI 25.25 kg/m   General Appearance: Well nourished, well groomed and in no apparent distress.  Eyes: PERRLA, EOMs, conjunctiva no swelling or erythema, normal fundi and vessels. Sinuses: No frontal/maxillary tenderness ENT/Mouth:  EACs patent / TMs  nl. Nares clear without erythema, swelling, mucoid exudates. Oral hygiene is good. No erythema, swelling, or exudate. Tongue normal, non-obstructing. Tonsils not swollen or erythematous. Hearing normal.  Neck: Supple, thyroid not palpable. No bruits, nodes or JVD. Respiratory: Respiratory effort normal.  BS equal and clear bilateral without rales, rhonci, wheezing or stridor. Cardio: Heart sounds are normal with regular rate and rhythm and no murmurs, rubs or gallops. Peripheral pulses are normal and equal bilaterally without edema. No aortic or femoral bruits. Chest: symmetric with normal excursions and percussion. Breasts: Symmetric, without lumps, nipple discharge, retractions, or  fibrocystic changes.  Abdomen: Flat, soft with bowel sounds active. Nontender, no guarding, rebound, hernias, masses, or organomegaly.  Lymphatics: Non tender without lymphadenopathy.  Musculoskeletal: Full ROM all peripheral extremities, joint stability, 5/5 strength, and normal gait. Skin: Warm and dry without rashes, lesions, cyanosis, clubbing or  ecchymosis.  Neuro: Cranial nerves intact, reflexes equal bilaterally. Normal muscle tone, no cerebellar symptoms. Sensation intact.  Pysch: Alert and oriented X 3, normal affect, Insight and Judgment appropriate.    Assessment and Plan  1. Essential hypertension  - EKG 12-Lead - Urinalysis, Routine w reflex microscopic - CBC with Differential/Platelet - COMPLETE METABOLIC PANEL WITH GFR - Magnesium - TSH  2. Hyperlipidemia, mixed  - EKG 12-Lead - Lipid panel - TSH  3. Abnormal glucose  - EKG 12-Lead - Hemoglobin A1c - Insulin, random  4. Vitamin D deficiency  - VITAMIN D 25 Hydroxy   5. Vitamin B12 deficiency  - Vitamin B12  6. Screening for colorectal cancer  - POC Hemoccult Bld/Stl   7. Screening for heart disease  - EKG 12-Lead  8. Aortic atherosclerosis (HCC) bny Abd CT scan on 10/30/2017  - EKG 12-Lead - Lipid  panel  9. Smoker  - CT CHEST LUNG CANCER SCREENING                      LOW DOSE WO CONTRAST; Future - EKG 12-Lead  10. FHx: heart disease  - EKG 12-Lead  11. Screening for lung cancer  - CT CHEST LUNG CANCER SCREENING                      LOW DOSE WO CONTRAST; Future  12. Medication management  - Urinalysis, Routine w reflex microscopic - Microalbumin / creatinine urine ratio - CBC with Differential/Platelet - COMPLETE METABOLIC PANEL WITH GFR - Magnesium - Lipid panel - TSH - Hemoglobin A1c - Insulin, random - VITAMIN D 25 Hydroxy  - Vitamin B12          Patient was counseled in prudent diet to achieve/maintain BMI less than 25 for weight control, BP monitoring, regular exercise and medications. Discussed med's effects and SE's. Screening labs and tests as requested with regular follow-up as recommended. Over 40 minutes of exam, counseling, chart review and high complex critical decision making was performed.   Marinus Maw, MD

## 2023-02-16 ENCOUNTER — Encounter: Payer: Self-pay | Admitting: Internal Medicine

## 2023-02-16 ENCOUNTER — Ambulatory Visit (INDEPENDENT_AMBULATORY_CARE_PROVIDER_SITE_OTHER): Payer: Medicare Other | Admitting: Internal Medicine

## 2023-02-16 VITALS — BP 127/72 | HR 72 | Temp 97.9°F | Resp 16 | Ht 70.0 in | Wt 176.0 lb

## 2023-02-16 DIAGNOSIS — F17200 Nicotine dependence, unspecified, uncomplicated: Secondary | ICD-10-CM | POA: Diagnosis not present

## 2023-02-16 DIAGNOSIS — I10 Essential (primary) hypertension: Secondary | ICD-10-CM | POA: Diagnosis not present

## 2023-02-16 DIAGNOSIS — Z1211 Encounter for screening for malignant neoplasm of colon: Secondary | ICD-10-CM

## 2023-02-16 DIAGNOSIS — E538 Deficiency of other specified B group vitamins: Secondary | ICD-10-CM | POA: Diagnosis not present

## 2023-02-16 DIAGNOSIS — Z8249 Family history of ischemic heart disease and other diseases of the circulatory system: Secondary | ICD-10-CM | POA: Diagnosis not present

## 2023-02-16 DIAGNOSIS — I700 Atherosclerosis of aorta: Secondary | ICD-10-CM

## 2023-02-16 DIAGNOSIS — Z122 Encounter for screening for malignant neoplasm of respiratory organs: Secondary | ICD-10-CM

## 2023-02-16 DIAGNOSIS — Z79899 Other long term (current) drug therapy: Secondary | ICD-10-CM

## 2023-02-16 DIAGNOSIS — E559 Vitamin D deficiency, unspecified: Secondary | ICD-10-CM | POA: Diagnosis not present

## 2023-02-16 DIAGNOSIS — R7309 Other abnormal glucose: Secondary | ICD-10-CM

## 2023-02-16 DIAGNOSIS — E782 Mixed hyperlipidemia: Secondary | ICD-10-CM | POA: Diagnosis not present

## 2023-02-16 DIAGNOSIS — Z136 Encounter for screening for cardiovascular disorders: Secondary | ICD-10-CM | POA: Diagnosis not present

## 2023-02-16 LAB — CBC WITH DIFFERENTIAL/PLATELET
Basophils Absolute: 21 cells/uL (ref 0–200)
Basophils Relative: 0.4 %
Eosinophils Relative: 2.9 %
MCHC: 34 g/dL (ref 32.0–36.0)
MCV: 97.3 fL (ref 80.0–100.0)
MPV: 10.5 fL (ref 7.5–12.5)
Neutro Abs: 3203 cells/uL (ref 1500–7800)
RDW: 11.9 % (ref 11.0–15.0)

## 2023-02-16 LAB — TEST AUTHORIZATION: TEST CODE:: 5363

## 2023-02-16 NOTE — Patient Instructions (Signed)

## 2023-02-17 LAB — INSULIN, RANDOM: Insulin: 19.9 u[IU]/mL — ABNORMAL HIGH

## 2023-02-17 LAB — COMPLETE METABOLIC PANEL WITH GFR
AG Ratio: 1.6 (calc) (ref 1.0–2.5)
ALT: 20 U/L (ref 6–29)
AST: 17 U/L (ref 10–35)
Albumin: 3.9 g/dL (ref 3.6–5.1)
Alkaline phosphatase (APISO): 55 U/L (ref 37–153)
BUN: 15 mg/dL (ref 7–25)
CO2: 29 mmol/L (ref 20–32)
Calcium: 9.3 mg/dL (ref 8.6–10.4)
Chloride: 104 mmol/L (ref 98–110)
Creat: 0.87 mg/dL (ref 0.60–1.00)
Globulin: 2.4 g/dL (calc) (ref 1.9–3.7)
Glucose, Bld: 86 mg/dL (ref 65–99)
Potassium: 4.5 mmol/L (ref 3.5–5.3)
Sodium: 141 mmol/L (ref 135–146)
Total Bilirubin: 0.6 mg/dL (ref 0.2–1.2)
Total Protein: 6.3 g/dL (ref 6.1–8.1)
eGFR: 71 mL/min/{1.73_m2} (ref >=60)

## 2023-02-17 LAB — CBC WITH DIFFERENTIAL/PLATELET
Absolute Monocytes: 556 cells/uL (ref 200–950)
Eosinophils Absolute: 151 cells/uL (ref 15–500)
HCT: 39.1 % (ref 35.0–45.0)
Hemoglobin: 13.3 g/dL (ref 11.7–15.5)
Lymphs Abs: 1269 cells/uL (ref 850–3900)
MCH: 33.1 pg — ABNORMAL HIGH (ref 27.0–33.0)
Monocytes Relative: 10.7 %
Neutrophils Relative %: 61.6 %
Platelets: 210 10*3/uL (ref 140–400)
RBC: 4.02 10*6/uL (ref 3.80–5.10)
Total Lymphocyte: 24.4 %
WBC: 5.2 10*3/uL (ref 3.8–10.8)

## 2023-02-17 LAB — LIPID PANEL
Cholesterol: 198 mg/dL (ref <200)
HDL: 66 mg/dL (ref >=50)
LDL Cholesterol (Calc): 110 mg/dL (calc) — ABNORMAL HIGH
Non-HDL Cholesterol (Calc): 132 mg/dL (calc) — ABNORMAL HIGH (ref <130)
Total CHOL/HDL Ratio: 3 (calc) (ref <5.0)
Triglycerides: 112 mg/dL (ref <150)

## 2023-02-17 LAB — VITAMIN B12: Vitamin B-12: 360 pg/mL (ref 200–1100)

## 2023-02-17 LAB — URINALYSIS, ROUTINE W REFLEX MICROSCOPIC
Bacteria, UA: NONE SEEN /HPF
Bilirubin Urine: NEGATIVE
Glucose, UA: NEGATIVE
Hgb urine dipstick: NEGATIVE
Hyaline Cast: NONE SEEN /LPF
Ketones, ur: NEGATIVE
Nitrite: NEGATIVE
Protein, ur: NEGATIVE
RBC / HPF: NONE SEEN /HPF (ref 0–2)
Specific Gravity, Urine: 1.02 (ref 1.001–1.035)
WBC, UA: >=60 /HPF — AB (ref 0–5)
pH: 5.5 (ref 5.0–8.0)

## 2023-02-17 LAB — TEST AUTHORIZATION

## 2023-02-17 LAB — MICROSCOPIC MESSAGE

## 2023-02-17 LAB — MICROALBUMIN / CREATININE URINE RATIO
Creatinine, Urine: 158 mg/dL (ref 20–275)
Microalb Creat Ratio: 4 mg/g creat (ref <30)
Microalb, Ur: 0.6 mg/dL

## 2023-02-17 LAB — HEMOGLOBIN A1C
Hgb A1c MFr Bld: 5 % of total Hgb (ref <5.7)
Mean Plasma Glucose: 97 mg/dL
eAG (mmol/L): 5.4 mmol/L

## 2023-02-17 LAB — MAGNESIUM: Magnesium: 2 mg/dL (ref 1.5–2.5)

## 2023-02-17 LAB — VITAMIN D 25 HYDROXY (VIT D DEFICIENCY, FRACTURES): Vit D, 25-Hydroxy: 45 ng/mL (ref 30–100)

## 2023-02-17 LAB — TSH: TSH: 0.74 mIU/L (ref 0.40–4.50)

## 2023-02-18 NOTE — Progress Notes (Signed)
^<^<^<^<^<^<^<^<^<^<^<^<^<^<^<^<^<^<^<^<^<^<^<^<^<^<^<^<^<^<^<^<^<^<^<^<^ ^>^>^>^>^>^>^>^>^>^>^>>^>^>^>^>^>^>^>^>^>^>^>^>^>^>^>^>^>^>^>^>^>^>^>^>^>  -Test results slightly outside the reference range are not unusual. If there is anything important, I will review this with you,  otherwise it is considered normal test values.  If you have further questions,  please do not hesitate to contact me at the office or via My Chart.   ^<^<^<^<^<^<^<^<^<^<^<^<^<^<^<^<^<^<^<^<^<^<^<^<^<^<^<^<^<^<^<^<^<^<^<^<^ ^>^>^>^>^>^>^>^>^>^>^>^>^>^>^>^>^>^>^>^>^>^>^>^>^>^>^>^>^>^>^>^>^>^>^>^>^  -  Total  Chol =    198  - Elevated             (  Ideal  or  Goal is less than 180  !  )  & -  Bad / Dangerous LDL  Chol = 11-  - also is VERY Elevated              (  Ideal  or  Goal is less than 70  !  )   - Much worse than last lipid values & hi Risk  !    - Have you stopped your Rosuvastatin  ? ? ?   ^<^<^<^<^<^<^<^<^<^<^<^<^<^<^<^<^<^<^<^<^<^<^<^<^<^<^<^<^<^<^<^<^<^<^<^<^ ^>^>^>^>^>^>^>^>^>^>^>^>^>^>^>^>^>^>^>^>^>^>^>^>^>^>^>^>^>^>^>^>^>^>^>^>^  -   -  Vitamin B12 =   360   is STILL  Very Low   (Ideal or Goal Vit B12 is between 450 - 1,100)     Low Vit B12 may be associated with Anemia , Fatigue,   Peripheral Neuropathy, Dementia, "Brain Fog", & Depression  - Recommend take a sub-lingual form of Vitamin B12 tablet   1,000 to 5,000 mcg tab that you dissolve under your tongue /Daily   - Can get Lavonia Dana - best price at [employer] or on [employer]    - Are you taking a sub-lingual Vitamin B12 as recommended in the past  ?   ^<^<^<^<^<^<^<^<^<^<^<^<^<^<^<^<^<^<^<^<^<^<^<^<^<^<^<^<^<^<^<^<^<^<^<^<^ ^>^>^>^>^>^>^>^>^>^>^>^>^>^>^>^>^>^>^>^>^>^>^>^>^>^>^>^>^>^>^>^>^>^>^>^>^  -  A1c - Normal - No Diabetes  -  Great   ^<^<^<^<^<^<^<^<^<^<^<^<^<^<^<^<^<^<^<^<^<^<^<^<^<^<^<^<^<^<^<^<^<^<^<^<^ ^>^>^>^>^>^>^>^>^>^>^>^>^>^>^>^>^>^>^>^>^>^>^>^>^>^>^>^>^>^>^>^>^>^>^>^>^  -  Vitamin D = 45 is Low   -  Vitamin D goal is between 70-100.   - Please take  Vitamin D 5,000 units / day  &                                          If you are already taking 5,000 units/ day,                                           then increase to 10,000 units  / day    - It is very important as a natural anti-inflammatory and helping the                           immune system protect against viral infections, like the Covid-19    helping hair, skin, and nails, as well as reducing stroke and heart attack risk.   - It helps your bones and helps with mood.  - It also decreases numerous cancer risks so please  take it as directed.   - Low Vit D is associated with a 200-300% higher risk for CANCER   and 200-300% higher risk for HEART   ATTACK  &  STROKE.    - It is also associated with higher death rate at younger ages,   autoimmune diseases like Rheumatoid arthritis, Lupus, Multiple Sclerosis.     - Also many other serious conditions, like depression, Alzheimer's  Dementia,  muscle aches, fatigue, fibromyalgia   ^<^<^<^<^<^<^<^<^<^<^<^<^<^<^<^<^<^<^<^<^<^<^<^<^<^<^<^<^<^<^<^<^<^<^<^<^ ^>^>^>^>^>^>^>^>^>^>^>^>^>^>^>^>^>^>^>^>^>^>^>^>^>^>^>^>^>^>^>^>^>^>^>^>^  -  All Else - CBC - Kidneys - Electrolytes - Liver - Magnesium & Thyroid    - all  Normal / OK ^<^<^<^<^<^<^<^<^<^<^<^<^<^<^<^<^<^<^<^<^<^<^<^<^<^<^<^<^<^<^<^<^<^<^<^<^ ^>^>^>^>^>^>^>^>^>^>^>^>^>^>^>^>^>^>^>^>^>^>^>^>^>^>^>^>^>^>^>^>^>^>^>^>^\

## 2023-03-29 ENCOUNTER — Ambulatory Visit (INDEPENDENT_AMBULATORY_CARE_PROVIDER_SITE_OTHER): Payer: Medicare Other | Admitting: Student

## 2023-03-29 ENCOUNTER — Ambulatory Visit (HOSPITAL_BASED_OUTPATIENT_CLINIC_OR_DEPARTMENT_OTHER): Payer: Medicare Other

## 2023-03-29 ENCOUNTER — Encounter (HOSPITAL_BASED_OUTPATIENT_CLINIC_OR_DEPARTMENT_OTHER): Payer: Self-pay | Admitting: Student

## 2023-03-29 DIAGNOSIS — S52599A Other fractures of lower end of unspecified radius, initial encounter for closed fracture: Secondary | ICD-10-CM

## 2023-03-29 DIAGNOSIS — S52611D Displaced fracture of right ulna styloid process, subsequent encounter for closed fracture with routine healing: Secondary | ICD-10-CM | POA: Diagnosis not present

## 2023-03-29 DIAGNOSIS — S6291XD Unspecified fracture of right wrist and hand, subsequent encounter for fracture with routine healing: Secondary | ICD-10-CM | POA: Diagnosis not present

## 2023-03-29 DIAGNOSIS — S52501D Unspecified fracture of the lower end of right radius, subsequent encounter for closed fracture with routine healing: Secondary | ICD-10-CM | POA: Diagnosis not present

## 2023-03-29 DIAGNOSIS — S52321D Displaced transverse fracture of shaft of right radius, subsequent encounter for closed fracture with routine healing: Secondary | ICD-10-CM | POA: Diagnosis not present

## 2023-03-29 NOTE — Progress Notes (Signed)
Chief Complaint: Right wrist pain        History of Present Illness:    03/29/2023: Brittany Hanna is here today for follow-up of her right wrist.  She reports that she is lacking range of motion in her wrist which she hoped would have been further along at this point.  She has been out of the wrist splint for the majority of the time for about a week.  Overall her wrist feels tight.  She does continue to have trouble holding objects that she is not able to form a fist.  Her pain levels are minimal without use of pain meds other than an occasional sharp pain    Surgical History:   None   PMH/PSH/Family History/Social History/Meds/Allergies:         Past Medical History:  Diagnosis Date   Anxiety     B12 deficiency     FHx: heart disease 10/07/2018   High cholesterol     Hypertension     IBS (irritable bowel syndrome)     Vitamin D deficiency           Past Surgical History:  Procedure Laterality Date   AUGMENTATION MAMMAPLASTY       CATARACT EXTRACTION, BILATERAL Bilateral 2019    Dr. Darel Hong    COLONOSCOPY   03/30/2010    Dr.Stark   TUBAL LIGATION        Social History         Socioeconomic History   Marital status: Widowed      Spouse name: Not on file   Number of children: Not on file   Years of education: Not on file   Highest education level: Not on file  Occupational History   Not on file  Tobacco Use   Smoking status: Former      Packs/day: 0.75      Years: 40.00      Additional pack years: 0.00      Total pack years: 30.00      Types: Cigarettes   Smokeless tobacco: Never  Vaping Use   Vaping Use: Never used  Substance and Sexual Activity   Alcohol use: Yes      Alcohol/week: 3.0 standard drinks of alcohol      Types: 3 Glasses of wine per week      Comment: Occ   Drug use: No   Sexual activity: Not on file  Other Topics Concern   Not on file  Social History Narrative   Not on file    Social Determinants of Health    Financial  Resource Strain: Not on file  Food Insecurity: Not on file  Transportation Needs: Not on file  Physical Activity: Not on file  Stress: Not on file  Social Connections: Not on file         Family History  Problem Relation Age of Onset   Heart failure Mother     Osteoarthritis Father     Hypertension Father     COPD Father     Breast cancer Maternal Aunt     Colon cancer Neg Hx     Esophageal cancer Neg Hx     Stomach cancer Neg Hx     Rectal cancer Neg Hx           Allergies  Allergen Reactions   Lipitor [Atorvastatin] Other (See Comments)      Fatigue.    No current facility-administered medications for this visit.  Current Outpatient Medications  Medication Sig Dispense Refill   ALPRAZolam (XANAX) 0.25 MG tablet Take 0.25 mg by mouth daily as needed for anxiety.       ezetimibe (ZETIA) 10 MG tablet Take  1 tablet  Daily  for Cholesterol 90 tablet 3   promethazine-dextromethorphan (PROMETHAZINE-DM) 6.25-15 MG/5ML syrup Take 5 mLs by mouth 4 (four) times daily as needed for cough. 240 mL 0   rosuvastatin (CRESTOR) 10 MG tablet Take 1 tablet Daily for Cholesterol 90 tablet 3             Facility-Administered Medications Ordered in Other Visits  Medication Dose Route Frequency Provider Last Rate Last Admin   lidocaine (XYLOCAINE) 2 % (with pres) injection 400 mg  20 mL Intradermal Once Loetta Rough, MD        Imaging Results (Last 48 hours)  No results found.     Review of Systems:   A ROS was performed including pertinent positives and negatives as documented in the HPI.   Physical Exam :   Constitutional: NAD and appears stated age Neurological: Alert and oriented Psych: Appropriate affect and cooperative There were no vitals taken for this visit.    Comprehensive Musculoskeletal Exam:     No significant tenderness over the right wrist.  Active range of motion from 20 degrees extension to 45 degrees flexion.  Some mild swelling but no notable  erythema.  Lacking strength and flexion of the fingers in order to form a fist.  Normal capillary refill to all digits.  Neurosensory exam intact.    Imaging:   Xray (right wrist 3 views): Improvement in fracture healing and callus formation of distal radius fracture compared to previous x-ray    I personally reviewed and interpreted the radiographs.     Assessment:   72 y.o. female with a distal radius fracture sustained on 12/16/2022.  X-rays today do show encouraging signs of healing and new bone formation.  She does have stiffness and lacks range of motion in the wrist, likely as result of meaning in the wrist splint for long period of time.  At this point I would like to get her referred over to occupational therapy and have them help her recover her range of motion and strength.  I do believe that she will progress well with this.  I will plan to have her return once more in about 6 weeks to evaluate progress with OT.    Plan :     -Referral to Occupational Therapy for range of motion and strengthening -Return to clinic in 6 weeks for reassessment         I personally saw and evaluated the patient, and participated in the management and treatment plan.     This document was dictated using Conservation officer, historic buildings. A reasonable attempt at proof reading has been made to minimize errors.

## 2023-04-11 ENCOUNTER — Other Ambulatory Visit: Payer: Self-pay

## 2023-04-11 ENCOUNTER — Ambulatory Visit: Payer: Medicare Other | Admitting: Rehabilitative and Restorative Service Providers"

## 2023-04-11 ENCOUNTER — Encounter: Payer: Self-pay | Admitting: Rehabilitative and Restorative Service Providers"

## 2023-04-11 DIAGNOSIS — M25531 Pain in right wrist: Secondary | ICD-10-CM | POA: Diagnosis not present

## 2023-04-11 DIAGNOSIS — M6281 Muscle weakness (generalized): Secondary | ICD-10-CM

## 2023-04-11 DIAGNOSIS — R278 Other lack of coordination: Secondary | ICD-10-CM | POA: Diagnosis not present

## 2023-04-11 DIAGNOSIS — M25631 Stiffness of right wrist, not elsewhere classified: Secondary | ICD-10-CM | POA: Diagnosis not present

## 2023-04-11 DIAGNOSIS — M25641 Stiffness of right hand, not elsewhere classified: Secondary | ICD-10-CM

## 2023-04-11 NOTE — Therapy (Signed)
OUTPATIENT OCCUPATIONAL THERAPY ORTHO EVALUATION  Patient Name: Brittany Hanna MRN: [id number] DOB:08-Oct-1950, 72 y.o., female Today's Date: 04/11/2023  PCP: Lucky Cowboy, MD REFERRING PROVIDER:  Amador Cunas, PA-C    END OF SESSION:  OT End of Session - 04/11/23 1440     Visit Number 1    Number of Visits 12    Date for OT Re-Evaluation 05/26/23    Authorization Type Medicare    OT Start Time 1440    OT Stop Time 1525    OT Time Calculation (min) 45 min    Activity Tolerance Patient tolerated treatment well;Patient limited by pain;Patient limited by fatigue    Behavior During Therapy [facility] for tasks assessed/performed;Anxious             Past Medical History:  Diagnosis Date   Anxiety    B12 deficiency    FHx: heart disease 10/07/2018   High cholesterol    Hypertension    IBS (irritable bowel syndrome)    Vitamin D deficiency    Past Surgical History:  Procedure Laterality Date   AUGMENTATION MAMMAPLASTY     CATARACT EXTRACTION, BILATERAL Bilateral 2019   Dr. Darel Hong    COLONOSCOPY  03/30/2010   Dr.Stark   TUBAL LIGATION     Patient Active Problem List   Diagnosis Date Noted   LBBB  (2021)  02/14/2022   Osteoporosis 01/07/2022   B12 deficiency 07/22/2021   COPD (chronic obstructive pulmonary disease) (HCC) - per CXR 2010 01/07/2019   OAB (overactive bladder) 10/07/2018   Dysthymia 10/07/2018   Gastroesophageal reflux disease 10/07/2018   Aortic atherosclerosis (HCC) bny Abd CT scan on 10/30/2017 10/30/2017   Smoker 10/24/2017   Anxiety 10/24/2017   CKD (chronic kidney disease) stage 2, GFR 60-89 ml/min 10/23/2017   BMI 25.0-25.9,adult 10/23/2017   Hyperlipidemia, mixed 08/08/2013   Essential hypertension 08/08/2013   Vitamin D deficiency 08/08/2013   Abnormal glucose 08/08/2013    ONSET DATE: DOI 12/16/22  REFERRING DIAG: S52.599A (ICD-10-CM) - Fracture of distal end of radius with dorsal angulation   THERAPY DIAG:  Stiffness of right  wrist, not elsewhere classified  Other lack of coordination  Muscle weakness (generalized)  Stiffness of right hand, not elsewhere classified  Pain in right wrist  Rationale for Evaluation and Treatment: Rehabilitation  SUBJECTIVE:   SUBJECTIVE STATEMENT: Now 16+ weeks after fall/right colles fracture with small ulnar styloid fracture, with conservative healing. She apparently broke her right wrist after a fall while backpedaling during a pickleball game. She states the big problem is that she can't make a fist, she is very stiff, she has pain in the night and intermittent pains in FA and wrist. She is RHD.  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 She admits to not using her Rt hand/wrist very much, being nervous and anxious about it.    PERTINENT HISTORY: Per referral: "S/p right wrist fx-work on ROM and strengthening    PRECAUTIONS: None  RED FLAGS: None   WEIGHT BEARING RESTRICTIONS: No WBAT- non-painfully   PAIN:  Are you having pain? Yes: NPRS scale: 1/10 at rest and in the past week at worst at night up to 8-9/10 Pain location: Rt wrist Pain description: aching Aggravating factors: weight bearing and lack of motion Relieving factors: heat  FALLS: Has patient fallen in last 6 months? Yes. Number of falls 1 (this accident- no fall risk)  LIVING ENVIRONMENT: Lives with: lives with their family Lives  in: House/apartment Has following equipment at home: None  PLOF: Independent  PATIENT GOALS: To improve the use of her right hand and arm for daily abilities   OBJECTIVE: (All objective assessments below are from initial evaluation on: 04/11/23 unless otherwise specified.)   HAND DOMINANCE: Right   ADLs: Overall ADLs: States decreased ability to grab, hold household objects, pain and difficulty to open containers, perform FMS tasks (manipulate fasteners on clothing), mild to moderate bathing problems as  well.   FUNCTIONAL OUTCOME MEASURES: Eval: Patient Rated Wrist Evaluation (PRWE): Pain: 24/50; Function: 35.5/50; Total Score: 60/100 (Higher Score  =  More Pain and/or Debility)    UPPER EXTREMITY ROM     Shoulder to Wrist AROM Right eval  Shoulder flexion   Shoulder abduction 150  Shoulder extension   Shoulder internal rotation   Shoulder external rotation   Elbow flexion 154  Elbow extension (-15)  Forearm supination 55  Forearm pronation  72  Wrist flexion 35  Wrist extension 33  Wrist ulnar deviation 5  Wrist radial deviation 11  Functional dart thrower's motion (F-DTM) in ulnar flexion 41  F-DTM in radial extension  45  (Blank rows = not tested)   Hand AROM Right eval  Full Fist Ability (or Gap to Distal Palmar Crease) 4cm gap from MF nail to [facility]  Thumb Opposition  (Kapandji Scale)  8/10  Index MCP (0-90)   Index PIP (0-100)   Index DIP (0-70)    Long MCP (0-90)    Long PIP (0-100)    Long DIP (0-70)    Ring MCP (0-90)    Ring PIP (0-100)    Ring DIP (0-70)    Little MCP (0-90)    Little PIP (0-100)    Little DIP (0-70)    (Blank rows = not tested)   UPPER EXTREMITY MMT:     MMT Right TBD  Elbow flexion 5/5  Elbow extension 5/5  Forearm supination 4/5  Forearm pronation 4/5  Wrist flexion 4-/5 tender  Wrist extension 4/5 no pain  (Blank rows = not tested)  HAND FUNCTION: Eval: Observed weakness in affected Rt hand.  Grip strength Right: 12 lbs, Left: 56 lbs   COORDINATION: Eval: Observed coordination impairments with affected Rt hand. 9 Hole Peg Test Right: 28sec (~21 sec is WFL)   SENSATION: Eval:  Light touch intact today  EDEMA:   Eval:  Mildly swollen in Rt hand and wrist today  COGNITION: Eval: Overall cognitive status: WFL for evaluation today, but some anxiety about needing and having to move Rt hand/wrist   OBSERVATIONS:   Eval: She has overt anxiety when asked to move hand and wrist, but she is pleasantly surprised that she  does not have much pain against therapist resistance etc.  She seems to have overt fear/guarding/disuse of right dominant hand and arm from wrist fracture and subsequent stiffness, poor coordination, weakness.   TODAY'S TREATMENT:  Post-evaluation treatment: For self-care/safety and improvement of functional status, OT recommends that she start using her right hand in all light, nonpainful daily activities as possible.  She can wear her brace to support her if doing anything more heavy than that.  She should avoid any painful lifting (this will likely be more than 5 pounds).  She was also given a following home exercises to begin then include active range of motion with overpressure as well as light stretches at the wrist.  OT does these with her and she tolerates well with no increase in  pain.  She is also educated to use heat for 5 minutes before stretches to help her loosen up, and ice for 10 minutes after if sore.  Exercises - Turn [employer] Facing Up & Down  - 4-6 x daily - 5 reps - 3 hold - Forearm Supination Stretch  - 4-6 x daily - 3 reps - 15 sec hold - Forearm Pronation Stretch  - 3-4 x daily - 3 reps - 15 sec hold - Bend and Pull Back Wrist SLOWLY  - 4 x daily - 5 reps - 5 hold - "Windshield Wipers"   - 4 x daily - 5 reps - 5 hold - Wrist Flexion Stretch  - 4 x daily - 5 reps - 15 sec hold - Wrist Extension Stretch Pronated  - 4 x daily - 5 reps - 15 hold   PATIENT EDUCATION: Education details: See tx section above for details  Person educated: Patient Education method: Verbal Instruction, Teach back, Handouts  Education comprehension: States and demonstrates understanding, Additional Education required    HOME EXERCISE PROGRAM: Access Code: [email or web address] URL: [email or web address] Date: 04/11/2023 Prepared by: Fannie Knee   GOALS: Goals reviewed with patient? Yes   SHORT TERM GOALS: (STG required if POC>30 days) Target Date: 04/28/23  Pt will obtain protective,  custom orthotic. Goal status: TBD/PRN  2.  Pt will demo/state understanding of initial HEP to improve pain levels and prerequisite motion. Goal status: INITIAL   LONG TERM GOALS: Target Date: 05/26/23  Pt will improve functional ability by decreased impairment per PRWE assessment from 60/100 to 20/100 or better, for better quality of life. Goal status: INITIAL  2.  Pt will improve grip strength in Rt hand from 12lbs to at least 45lbs for functional use at home and in IADLs. Goal status: INITIAL  3.  Pt will improve A/ROM in Rt wrist flex/ext from 35*/33* to at least 55* each, to have functional motion for tasks like reach and grasp.  Goal status: INITIAL  4.  Pt will improve strength in Rt wrist flex/ext from 4-/5 MMT to at least 4+/5 MMT to have increased functional ability to carry out selfcare and higher-level homecare tasks with less difficulty. Goal status: INITIAL  5.  Pt will improve coordination skills in Rt hand/arm, as seen by better score on 9HPT testing to 23sec or better, to have increased functional ability to carry out fine motor tasks (fasteners, etc.) and more complex, coordinated IADLs (meal prep, sports, etc.).  Goal status: INITIAL  6.  Pt will decrease pain at worst from 8-9/10 to 3/10 or better to have better sleep and occupational participation in daily roles. Goal status: INITIAL   ASSESSMENT:  CLINICAL IMPRESSION: Patient is a 73 y.o. female who was seen today for occupational therapy evaluation for fractured right wrist and subsequent slower healing with guarding from pain and disuse of right hand creating weakness, stiffness, decreased functional ability.  She will benefit from outpatient occupational therapy to decrease negative symptoms and increase quality of life.   PERFORMANCE DEFICITS: in functional skills including ADLs, IADLs, coordination, dexterity, edema, ROM, strength, pain, fascial restrictions, flexibility, Fine motor control, Gross motor  control, body mechanics, endurance, decreased knowledge of precautions, and UE functional use, cognitive skills including problem solving and safety awareness, and psychosocial skills including coping strategies, environmental adaptation, and routines and behaviors.   IMPAIRMENTS: are limiting patient from ADLs, IADLs, work, leisure, and social participation.   COMORBIDITIES: may have co-morbidities  that affects occupational performance. Patient will benefit  from skilled OT to address above impairments and improve overall function.  MODIFICATION OR ASSISTANCE TO COMPLETE EVALUATION: No modification of tasks or assist necessary to complete an evaluation.  OT OCCUPATIONAL PROFILE AND HISTORY: Problem focused assessment: Including review of records relating to presenting problem.  CLINICAL DECISION MAKING: LOW - limited treatment options, no task modification necessary  REHAB POTENTIAL: Excellent  EVALUATION COMPLEXITY: Low      PLAN:  OT FREQUENCY: 1-2x/week  OT DURATION: 6 weeks through 05/26/23 as needed up to 12 visits as needed   PLANNED INTERVENTIONS: self care/ADL training, therapeutic exercise, therapeutic activity, neuromuscular re-education, manual therapy, passive range of motion, splinting, ultrasound, fluidotherapy, compression bandaging, moist heat, cryotherapy, contrast bath, patient/family education, energy conservation, coping strategies training, DME and/or AE instructions, Re-evaluation, and Dry needling  RECOMMENDED OTHER SERVICES: none now   CONSULTED AND AGREED WITH PLAN OF CARE: Patient  PLAN FOR NEXT SESSION:   Review initial recommendations and home exercises, and finger and thumb stretches next session, upgrade to light grip and pinch strengthening when tolerated   Fannie Knee, OTR/L 04/11/2023, 5:24 PM

## 2023-04-20 ENCOUNTER — Encounter: Payer: Self-pay | Admitting: Occupational Therapy

## 2023-04-20 ENCOUNTER — Ambulatory Visit (INDEPENDENT_AMBULATORY_CARE_PROVIDER_SITE_OTHER): Payer: Medicare Other | Admitting: Occupational Therapy

## 2023-04-20 DIAGNOSIS — M25631 Stiffness of right wrist, not elsewhere classified: Secondary | ICD-10-CM | POA: Diagnosis not present

## 2023-04-20 DIAGNOSIS — M25531 Pain in right wrist: Secondary | ICD-10-CM | POA: Diagnosis not present

## 2023-04-20 DIAGNOSIS — M25641 Stiffness of right hand, not elsewhere classified: Secondary | ICD-10-CM

## 2023-04-20 DIAGNOSIS — M6281 Muscle weakness (generalized): Secondary | ICD-10-CM | POA: Diagnosis not present

## 2023-04-20 DIAGNOSIS — R278 Other lack of coordination: Secondary | ICD-10-CM | POA: Diagnosis not present

## 2023-04-20 NOTE — Therapy (Signed)
OUTPATIENT OCCUPATIONAL THERAPY ORTHO TREATMENT  Patient Name: JERI CAMPIONE MRN: [id number] DOB:12-18-1950, 72 y.o., female Today's Date: 04/20/2023  PCP: Lucky Cowboy, MD REFERRING PROVIDER:  Amador Cunas, PA-C    END OF SESSION:  OT End of Session - 04/20/23 0926     Visit Number 2    Number of Visits 12    Date for OT Re-Evaluation 05/26/23    Authorization Type Medicare    OT Start Time 0930    OT Stop Time 1011    OT Time Calculation (min) 41 min    Activity Tolerance Patient tolerated treatment well;Patient limited by pain;Patient limited by fatigue    Behavior During Therapy [facility] for tasks assessed/performed;Anxious              Past Medical History:  Diagnosis Date   Anxiety    B12 deficiency    FHx: heart disease 10/07/2018   High cholesterol    Hypertension    IBS (irritable bowel syndrome)    Vitamin D deficiency    Past Surgical History:  Procedure Laterality Date   AUGMENTATION MAMMAPLASTY     CATARACT EXTRACTION, BILATERAL Bilateral 2019   Dr. Darel Hong    COLONOSCOPY  03/30/2010   Dr.Stark   TUBAL LIGATION     Patient Active Problem List   Diagnosis Date Noted   LBBB  (2021)  02/14/2022   Osteoporosis 01/07/2022   B12 deficiency 07/22/2021   COPD (chronic obstructive pulmonary disease) (HCC) - per CXR 2010 01/07/2019   OAB (overactive bladder) 10/07/2018   Dysthymia 10/07/2018   Gastroesophageal reflux disease 10/07/2018   Aortic atherosclerosis (HCC) bny Abd CT scan on 10/30/2017 10/30/2017   Smoker 10/24/2017   Anxiety 10/24/2017   CKD (chronic kidney disease) stage 2, GFR 60-89 ml/min 10/23/2017   BMI 25.0-25.9,adult 10/23/2017   Hyperlipidemia, mixed 08/08/2013   Essential hypertension 08/08/2013   Vitamin D deficiency 08/08/2013   Abnormal glucose 08/08/2013    ONSET DATE: DOI 12/16/22  REFERRING DIAG: S52.599A (ICD-10-CM) - Fracture of distal end of radius with dorsal angulation   THERAPY DIAG:  Stiffness of  right wrist, not elsewhere classified  Other lack of coordination  Muscle weakness (generalized)  Stiffness of right hand, not elsewhere classified  Pain in right wrist  Rationale for Evaluation and Treatment: Rehabilitation  SUBJECTIVE:   SUBJECTIVE STATEMENT: Pt is currently 17+ weeks s/p R colles fracture with small ulnar styloid fracture. She reports 0/10 pain at rest today. She continues to report difficulty with picking things up and functional use with right hand, "I am brushing my teeth with this hand now though, it's not perfect". Pt is inability to make fist with right hand noted/visual observation at this time.  PERTINENT HISTORY: Per referral: "S/p right wrist fx-work on ROM and strengthening    PRECAUTIONS: None  RED FLAGS: None   WEIGHT BEARING RESTRICTIONS: No WBAT- non-painfully   PAIN:  Are you having pain? 0/10 at rest, pt reports "sore" but not pain with exercises and functional use like "picking up my Yeti" Yes: NPRS scale: 0/10 at rest and  Pain location: Rt wrist Pain description: aching Aggravating factors: weight bearing and lack of motion Relieving factors: heat  FALLS: Has patient fallen in last 6 months? Yes. Number of falls 1 (this accident- no fall risk)  LIVING ENVIRONMENT: Lives with: lives with their family Lives in: House/apartment Has following equipment at home: None  PLOF: Independent  PATIENT GOALS: To improve the use of her right hand  and arm for daily abilities   OBJECTIVE: (All objective assessments below are from initial evaluation on: 04/11/23 unless otherwise specified.)   HAND DOMINANCE: Right   ADLs: Overall ADLs: States decreased ability to grab, hold household objects, pain and difficulty to open containers, perform FMS tasks (manipulate fasteners on clothing), mild to moderate bathing problems as well.   FUNCTIONAL OUTCOME MEASURES: Eval: Patient Rated Wrist Evaluation (PRWE): Pain: 24/50; Function: 35.5/50; Total  Score: 60/100 (Higher Score  =  More Pain and/or Debility)    UPPER EXTREMITY ROM     Shoulder to Wrist AROM Right eval  Shoulder flexion   Shoulder abduction 150  Shoulder extension   Shoulder internal rotation   Shoulder external rotation   Elbow flexion 154  Elbow extension (-15)  Forearm supination 55  Forearm pronation  72  Wrist flexion 35  Wrist extension 33  Wrist ulnar deviation 5  Wrist radial deviation 11  Functional dart thrower's motion (F-DTM) in ulnar flexion 41  F-DTM in radial extension  45  (Blank rows = not tested)   Hand AROM Right eval  Full Fist Ability (or Gap to Distal Palmar Crease) 4cm gap from MF nail to [facility]  Thumb Opposition  (Kapandji Scale)  8/10  Index MCP (0-90)   Index PIP (0-100)   Index DIP (0-70)    Long MCP (0-90)    Long PIP (0-100)    Long DIP (0-70)    Ring MCP (0-90)    Ring PIP (0-100)    Ring DIP (0-70)    Little MCP (0-90)    Little PIP (0-100)    Little DIP (0-70)    (Blank rows = not tested)   UPPER EXTREMITY MMT:     MMT Right TBD  Elbow flexion 5/5  Elbow extension 5/5  Forearm supination 4/5  Forearm pronation 4/5  Wrist flexion 4-/5 tender  Wrist extension 4/5 no pain  (Blank rows = not tested)  HAND FUNCTION: Eval: Observed weakness in affected Rt hand.  Grip strength Right: 12 lbs, Left: 56 lbs   COORDINATION: Eval: Observed coordination impairments with affected Rt hand. 9 Hole Peg Test Right: 28sec (~21 sec is WFL)   SENSATION: Eval:  Light touch intact today  EDEMA:   Eval:  Mildly swollen in Rt hand and wrist today  COGNITION: Eval: Overall cognitive status: WFL for evaluation today, but some anxiety about needing and having to move Rt hand/wrist   OBSERVATIONS:   Eval: She has overt anxiety when asked to move hand and wrist, but she is pleasantly surprised that she does not have much pain against therapist resistance etc.  She seems to have overt fear/guarding/disuse of right dominant  hand and arm from wrist fracture and subsequent stiffness, poor coordination, weakness.   TODAY'S TREATMENT:  04/20/23: Reviewed and performed HEP as issued at initial eval on 04/11/23. Pt required occasional vc's for positioning or to hold exercise for a stretch. Pt is with  noted improved active supination and wrist flexion, but wrist extension and RD/UD remain difficult. Upgraded HEP today for finger/tendon gliding, opposition and stretch to base of thumb and gentle strengthening with yellow putty for grip and lateral pinch. All there ex were performed and reviewed in clinic today x10 reps. Please refer to [employer] HEP below for full details. Verbally reviewed/discussed using R UE for ADL's and functional activity as tolerated in addition to HEP. Pt was with noted improved active finger flexion after performing tendon gliding and opposition in clinic  today. Note that pt is performing HEP 3-4x/day and has met STG's at this time.  04/11/23: Post-evaluation treatment: For self-care/safety and improvement of functional status, OT recommends that she start using her right hand in all light, nonpainful daily activities as possible.  She can wear her brace to support her if doing anything more heavy than that.  She should avoid any painful lifting (this will likely be more than 5 pounds).  She was also given a following home exercises to begin then include active range of motion with overpressure as well as light stretches at the wrist.  OT does these with her and she tolerates well with no increase in pain.  She is also educated to use heat for 5 minutes before stretches to help her loosen up, and ice for 10 minutes after if sore.  Access Code: [email or web address] URL: [email or web address] Date: 04/20/2023 Prepared by: Mariam Dollar  Exercises - Turn Palm Facing Up & Down  - 4-6 x daily - 5 reps - 3 hold - Forearm Supination Stretch  - 4-6 x daily - 3 reps - 15 sec hold - Forearm Pronation Stretch  -  3-4 x daily - 3 reps - 15 sec hold - Bend and Pull Back Wrist SLOWLY  - 4 x daily - 5 reps - 5 hold - "Windshield Wipers"   - 4 x daily - 5 reps - 5 hold - Wrist Flexion Stretch  - 4 x daily - 5 reps - 15 sec hold - Wrist Extension Stretch Pronated  - 4 x daily - 5 reps - 15 hold - Seated Wrist Flexor Hook Fist Tendon Gliding  - 4 x daily - 7 x weekly - 3 sets - 10 reps - 10 hold - Hand AROM Flat Fist  - 4 x daily - 7 x weekly - 3 sets - 10 reps - 10 hold - Seated Wrist Flexor Full Fist Tendon Gliding  - 4 x daily - 7 x weekly - 3 sets - 10 reps - 10 hold - Seated Finger MP Flexion AROM  - 4 x daily - 7 x weekly - 3 sets - 10 reps - 10 hold - Thumb AROM Opposition To All Fingers  - 4 x daily - 7 x weekly - 3 sets - 10 reps - 10 hold - Putty Squeezes  - 1 x daily - 7 x weekly - 1 sets - 5 reps - Finger Key Grip with Putty  - 1 x daily - 7 x weekly - 1 sets - 5 reps   PATIENT EDUCATION: Education details: See tx section above for details  Person educated: Patient Education method: Verbal Instruction, Teach back, Handouts  Education comprehension: States and demonstrates understanding, Additional Education required    HOME EXERCISE PROGRAM: Access Code: [email or web address] URL: [email or web address] Date: 04/11/2023 Prepared by: Fannie Knee   GOALS: Goals reviewed with patient? Yes   SHORT TERM GOALS: (STG required if POC>30 days) Target Date: 04/28/23  Pt will obtain protective, custom orthotic. Goal status: N/A  2.  Pt will demo/state understanding of initial HEP to improve pain levels and prerequisite motion. Goal status: Met (04/20/23)   LONG TERM GOALS: Target Date: 05/26/23  Pt will improve functional ability by decreased impairment per PRWE assessment from 60/100 to 20/100 or better, for better quality of life. Goal status: INITIAL  2.  Pt will improve grip strength in Rt hand from 12lbs to at least 45lbs for functional use at home and in IADLs. Goal status:  INITIAL  3.  Pt will improve A/ROM in Rt wrist flex/ext from 35*/33* to at least 55* each, to have functional motion for tasks like reach and grasp.  Goal status: INITIAL  4.  Pt will improve strength in Rt wrist flex/ext from 4-/5 MMT to at least 4+/5 MMT to have increased functional ability to carry out selfcare and higher-level homecare tasks with less difficulty. Goal status: INITIAL  5.  Pt will improve coordination skills in Rt hand/arm, as seen by better score on 9HPT testing to 23sec or better, to have increased functional ability to carry out fine motor tasks (fasteners, etc.) and more complex, coordinated IADLs (meal prep, sports, etc.).  Goal status: INITIAL  6.  Pt will decrease pain at worst from 8-9/10 to 3/10 or better to have better sleep and occupational participation in daily roles. Goal status: INITIAL   ASSESSMENT:  CLINICAL IMPRESSION: Pt should benefit from upgraded HEP today for finger/tendon gliding, opposition and stretch to base of thumb and gentle strengthening with yellow putty for grip and lateral pinch. As well as using R UE for ADL's and functional activity. She has met STG's 1 & 2 at this time.  PERFORMANCE DEFICITS: in functional skills including ADLs, IADLs, coordination, dexterity, edema, ROM, strength, pain, fascial restrictions, flexibility, Fine motor control, Gross motor control, body mechanics, endurance, decreased knowledge of precautions, and UE functional use, cognitive skills including problem solving and safety awareness, and psychosocial skills including coping strategies, environmental adaptation, and routines and behaviors.   IMPAIRMENTS: are limiting patient from ADLs, IADLs, work, leisure, and social participation.   COMORBIDITIES: may have co-morbidities  that affects occupational performance. Patient will benefit from skilled OT to address above impairments and improve overall function.  MODIFICATION OR ASSISTANCE TO COMPLETE EVALUATION:  No modification of tasks or assist necessary to complete an evaluation.  OT OCCUPATIONAL PROFILE AND HISTORY: Problem focused assessment: Including review of records relating to presenting problem.  CLINICAL DECISION MAKING: LOW - limited treatment options, no task modification necessary  REHAB POTENTIAL: Excellent  EVALUATION COMPLEXITY: Low      PLAN:  OT FREQUENCY: 1-2x/week  OT DURATION: 6 weeks through 05/26/23 as needed up to 12 visits as needed   PLANNED INTERVENTIONS: self care/ADL training, therapeutic exercise, therapeutic activity, neuromuscular re-education, manual therapy, passive range of motion, splinting, ultrasound, fluidotherapy, compression bandaging, moist heat, cryotherapy, contrast bath, patient/family education, energy conservation, coping strategies training, DME and/or AE instructions, Re-evaluation, and Dry needling  RECOMMENDED OTHER SERVICES: none now   CONSULTED AND AGREED WITH PLAN OF CARE: Patient  PLAN FOR NEXT SESSION:   Review/upgrade HEP, begin to focus on LTG's, finger flexion and functional use of right hand.   Mariam Dollar Beth Dixon, OTR/L 04/20/2023, 1:07 PM

## 2023-04-27 ENCOUNTER — Encounter: Payer: Medicare Other | Admitting: Occupational Therapy

## 2023-05-04 ENCOUNTER — Encounter: Payer: Self-pay | Admitting: Occupational Therapy

## 2023-05-04 ENCOUNTER — Ambulatory Visit (INDEPENDENT_AMBULATORY_CARE_PROVIDER_SITE_OTHER): Payer: Medicare Other | Admitting: Occupational Therapy

## 2023-05-04 DIAGNOSIS — R278 Other lack of coordination: Secondary | ICD-10-CM

## 2023-05-04 DIAGNOSIS — M25531 Pain in right wrist: Secondary | ICD-10-CM

## 2023-05-04 DIAGNOSIS — M25631 Stiffness of right wrist, not elsewhere classified: Secondary | ICD-10-CM | POA: Diagnosis not present

## 2023-05-04 DIAGNOSIS — M25641 Stiffness of right hand, not elsewhere classified: Secondary | ICD-10-CM

## 2023-05-04 DIAGNOSIS — M6281 Muscle weakness (generalized): Secondary | ICD-10-CM

## 2023-05-04 NOTE — Therapy (Signed)
OUTPATIENT OCCUPATIONAL THERAPY ORTHO TREATMENT  Patient Name: Brittany Hanna MRN: [id number] DOB:August 08, 1951, 72 y.o., female Today's Date: 05/04/2023  PCP: Lucky Cowboy, MD REFERRING PROVIDER:  Amador Cunas, PA-C    END OF SESSION:  OT End of Session - 05/04/23 1058     Visit Number 3    Number of Visits 12    Date for OT Re-Evaluation 05/26/23    Authorization Type Medicare    OT Start Time 1100    OT Stop Time 1144    OT Time Calculation (min) 44 min    Equipment Utilized During Treatment UBE, fluidotherapy    Activity Tolerance Patient tolerated treatment well;No increased pain    Behavior During Therapy [facility] for tasks assessed/performed;Anxious               Past Medical History:  Diagnosis Date   Anxiety    B12 deficiency    FHx: heart disease 10/07/2018   High cholesterol    Hypertension    IBS (irritable bowel syndrome)    Vitamin D deficiency    Past Surgical History:  Procedure Laterality Date   AUGMENTATION MAMMAPLASTY     CATARACT EXTRACTION, BILATERAL Bilateral 2019   Dr. Darel Hong    COLONOSCOPY  03/30/2010   Dr.Stark   TUBAL LIGATION     Patient Active Problem List   Diagnosis Date Noted   LBBB  (2021)  02/14/2022   Osteoporosis 01/07/2022   B12 deficiency 07/22/2021   COPD (chronic obstructive pulmonary disease) (HCC) - per CXR 2010 01/07/2019   OAB (overactive bladder) 10/07/2018   Dysthymia 10/07/2018   Gastroesophageal reflux disease 10/07/2018   Aortic atherosclerosis (HCC) bny Abd CT scan on 10/30/2017 10/30/2017   Smoker 10/24/2017   Anxiety 10/24/2017   CKD (chronic kidney disease) stage 2, GFR 60-89 ml/min 10/23/2017   BMI 25.0-25.9,adult 10/23/2017   Hyperlipidemia, mixed 08/08/2013   Essential hypertension 08/08/2013   Vitamin D deficiency 08/08/2013   Abnormal glucose 08/08/2013    ONSET DATE: DOI 12/16/22  REFERRING DIAG: S52.599A (ICD-10-CM) - Fracture of distal end of radius with dorsal angulation    THERAPY DIAG:  Stiffness of right wrist, not elsewhere classified  Other lack of coordination  Muscle weakness (generalized)  Stiffness of right hand, not elsewhere classified  Pain in right wrist  Rationale for Evaluation and Treatment: Rehabilitation  SUBJECTIVE:   SUBJECTIVE STATEMENT: 05/04/23: Pt is currently 19+ weeks s/p R colles fracture with small ulnar styloid fracture. She reports that her A/ROM is improving but she remains stiff in the mornings and feels as though she is starting over, "it's not as good as it was the day before" when she first does her HEP.  PERTINENT HISTORY: Per referral: "S/p right wrist fx-work on ROM and strengthening    PRECAUTIONS: None  RED FLAGS: None   WEIGHT BEARING RESTRICTIONS: No WBAT- non-painfully   PAIN:  Are you having pain? 0/10 at rest, pt reports "sore" but not pain with exercises and functional use like "picking up my Yeti" Yes: NPRS scale: 2/10 at it's worst Pain location: Rt wrist Pain description: aching Aggravating factors: weight bearing and lack of motion Relieving factors: heat  FALLS: Has patient fallen in last 6 months? Yes. Number of falls 1 (this accident- no fall risk)  LIVING ENVIRONMENT: Lives with: lives with their family Lives in: House/apartment Has following equipment at home: None  PLOF: Independent  PATIENT GOALS: To improve the use of her right hand and arm for daily abilities  OBJECTIVE: (All objective assessments below are from initial evaluation on: 04/11/23 unless otherwise specified.)   HAND DOMINANCE: Right   ADLs: Overall ADLs: States decreased ability to grab, hold household objects, pain and difficulty to open containers, perform FMS tasks (manipulate fasteners on clothing), mild to moderate bathing problems as well.   FUNCTIONAL OUTCOME MEASURES: Eval: Patient Rated Wrist Evaluation (PRWE): Pain: 24/50; Function: 35.5/50; Total Score: 60/100 (Higher Score  =  More Pain and/or  Debility)    UPPER EXTREMITY ROM     Shoulder to Wrist AROM Right eval  Shoulder flexion   Shoulder abduction 150  Shoulder extension   Shoulder internal rotation   Shoulder external rotation   Elbow flexion 154  Elbow extension (-15)  Forearm supination 55  Forearm pronation  72  Wrist flexion 35  Wrist extension 33  Wrist ulnar deviation 5  Wrist radial deviation 11  Functional dart thrower's motion (F-DTM) in ulnar flexion 41  F-DTM in radial extension  45  (Blank rows = not tested)   Hand AROM Right eval Right 05/04/23  Full Fist Ability (or Gap to Distal Palmar Crease) 4cm gap from MF nail to [facility] 0.5 cm from [facility] after ex  Thumb Opposition  (Kapandji Scale)  8/10 WNL's  Index MCP (0-90)    Index PIP (0-100)    Index DIP (0-70)     Long MCP (0-90)     Long PIP (0-100)     Long DIP (0-70)     Ring MCP (0-90)     Ring PIP (0-100)     Ring DIP (0-70)     Little MCP (0-90)     Little PIP (0-100)     Little DIP (0-70)     (Blank rows = not tested)   UPPER EXTREMITY MMT:     MMT Right TBD  Elbow flexion 5/5  Elbow extension 5/5  Forearm supination 4/5  Forearm pronation 4/5  Wrist flexion 4-/5 tender  Wrist extension 4/5 no pain  (Blank rows = not tested)  HAND FUNCTION: Eval: Observed weakness in affected Rt hand.  Grip strength Right: 12 lbs, Left: 56 lbs  05/04/23: Rt 20.9#, Lt 56.8#  COORDINATION: Eval: Observed coordination impairments with affected Rt hand. 9 Hole Peg Test Right: 28sec (~21 sec is WFL)   SENSATION: Eval:  Light touch intact today  EDEMA:   Eval:  Mildly swollen in Rt hand and wrist today  COGNITION: Eval: Overall cognitive status: WFL for evaluation today, but some anxiety about needing and having to move Rt hand/wrist   OBSERVATIONS:   Eval: She has overt anxiety when asked to move hand and wrist, but she is pleasantly surprised that she does not have much pain against therapist resistance etc.  She seems to have overt  fear/guarding/disuse of right dominant hand and arm from wrist fracture and subsequent stiffness, poor coordination, weakness.   TODAY'S TREATMENT:  05/04/23: Fluidotherapy while performing tendon gliding and wrist RD/UD, wrist flexion/extension exercises x10 min. Review and performance of HEP as outlined below (see detailed Medbridge HEP) added prayer stretch to HEP as pt is able to place palms together; Gentle stretching, A/AROM and P/ROM to digits for composite flexion, tendon gliding, wrist flex/exten and wrist RD/UD. UBE for grip and reciprocal A/ROM to bilateral UE's, level 1 x8 min rotating forward and back every 2 min. Verbal review of yellow putty exercises for grip and pinch. Issued cylindrical foam for placement on pairing knife/utensils etc PRN. Discussed ADL's and using right  hand for daily activities and assisting when able - specifically when buckling seatbelt, getting dressed, opening doors, washing dishes, bathing etc. Pt with nice gains in A/ROM for Rt wrist/hand after therapy today. Pt was noted to be <0.58m from [facility] when making a fist, opposition is WNL's and grip has improved from 12# to 20.9#.   04/20/23: Reviewed and performed HEP as issued at initial eval on 04/11/23. Pt required occasional vc's for positioning or to hold exercise for a stretch. Pt is with  noted improved active supination and wrist flexion, but wrist extension and RD/UD remain difficult. Upgraded HEP today for finger/tendon gliding, opposition and stretch to base of thumb and gentle strengthening with yellow putty for grip and lateral pinch. All there ex were performed and reviewed in clinic today x10 reps. Please refer to Medbridge HEP below for full details. Verbally reviewed/discussed using R UE for ADL's and functional activity as tolerated in addition to HEP. Pt was with noted improved active finger flexion after performing tendon gliding and opposition in clinic today. Note that pt is performing HEP 3-4x/day and has  met STG's at this time.  04/11/23: Post-evaluation treatment: For self-care/safety and improvement of functional status, OT recommends that she start using her right hand in all light, nonpainful daily activities as possible.  She can wear her brace to support her if doing anything more heavy than that.  She should avoid any painful lifting (this will likely be more than 5 pounds).  She was also given a following home exercises to begin then include active range of motion with overpressure as well as light stretches at the wrist.  OT does these with her and she tolerates well with no increase in pain.  She is also educated to use heat for 5 minutes before stretches to help her loosen up, and ice for 10 minutes after if sore.  Access Code: [email or web address] URL: [email or web address] Date: 05/04/2023 Prepared by: Mariam Dollar  Exercises - Turn Palm Facing Up & Down  - 4-6 x daily - 5 reps - 3 hold - Forearm Supination Stretch  - 4-6 x daily - 3 reps - 15 sec hold - Forearm Pronation Stretch  - 3-4 x daily - 3 reps - 15 sec hold - Bend and Pull Back Wrist SLOWLY  - 4 x daily - 5 reps - 5 hold - "Windshield Wipers"   - 4 x daily - 5 reps - 5 hold - Wrist Flexion Stretch  - 4 x daily - 5 reps - 15 sec hold - Wrist Extension Stretch Pronated  - 4 x daily - 5 reps - 15 hold - Seated Wrist Flexor Hook Fist Tendon Gliding  - 4 x daily - 7 x weekly - 3 sets - 10 reps - 10 hold - Hand AROM Flat Fist  - 4 x daily - 7 x weekly - 1 sets - 10 reps - 10 hold - Seated Wrist Flexor Full Fist Tendon Gliding  - 4 x daily - 7 x weekly - 1 sets - 10 reps - 10 hold - Seated Finger MP Flexion AROM  - 4 x daily - 7 x weekly - 1 sets - 10 reps - 10 hold - Thumb AROM Opposition To All Fingers  - 4 x daily - 7 x weekly - 1 sets - 10 reps - 10 hold - Putty Squeezes  - 4 x daily - 7 x weekly - 1 sets - 5 reps - Finger Key Grip with Putty  -  4 x daily - 7 x weekly - 1 sets - 5 reps - Seated Wrist Prayer Stretch  - 4 x  daily - 7 x weekly - 1 sets - 10 reps   PATIENT EDUCATION: Education details: See tx section above for details  Person educated: Patient Education method: Verbal Instruction, Teach back, Handouts  Education comprehension: States and demonstrates understanding, Additional Education required   Home Exercise Program Access Code: [email or web address] URL: [email or web address] Date: 05/04/2023 Prepared by: Mariam Dollar   GOALS: Goals reviewed with patient? Yes   SHORT TERM GOALS: (STG required if POC>30 days) Target Date: 04/28/23  Pt will obtain protective, custom orthotic. Goal status: N/A  2.  Pt will demo/state understanding of initial HEP to improve pain levels and prerequisite motion. Goal status: Met (04/20/23)   LONG TERM GOALS: Target Date: 05/26/23  Pt will improve functional ability by decreased impairment per PRWE assessment from 60/100 to 20/100 or better, for better quality of life. Goal status: INITIAL  2.  Pt will improve grip strength in Rt hand from 12lbs to at least 45lbs for functional use at home and in IADLs. Goal status: INITIAL  3.  Pt will improve A/ROM in Rt wrist flex/ext from 35*/33* to at least 55* each, to have functional motion for tasks like reach and grasp.  Goal status: INITIAL  4.  Pt will improve strength in Rt wrist flex/ext from 4-/5 MMT to at least 4+/5 MMT to have increased functional ability to carry out selfcare and higher-level homecare tasks with less difficulty. Goal status: INITIAL  5.  Pt will improve coordination skills in Rt hand/arm, as seen by better score on 9HPT testing to 23sec or better, to have increased functional ability to carry out fine motor tasks (fasteners, etc.) and more complex, coordinated IADLs (meal prep, sports, etc.).  Goal status: INITIAL  6.  Pt will decrease pain at worst from 8-9/10 to 3/10 or better to have better sleep and occupational participation in daily roles. Goal status:  INITIAL   ASSESSMENT:  CLINICAL IMPRESSION: Pt is gaining confidence in functional use of Rt UE as well as using R UE for ADL's and functional activity. She should notice improved A/ROM with upgraded HEP. She is making nice gains overall, as she was noted to be <0.5cm from [facility] when making a fist (as compared to 4cm gap from MF nail to [facility] at initial evaluation), opposition is WNL's and grip has improved from 12# to 20.9#. Pt is making nice gains with A/ROM of digits and wrist. Continue with A/ROM and strengthening with focus on LTG's.  PERFORMANCE DEFICITS: in functional skills including ADLs, IADLs, coordination, dexterity, edema, ROM, strength, pain, fascial restrictions, flexibility, Fine motor control, Gross motor control, body mechanics, endurance, decreased knowledge of precautions, and UE functional use, cognitive skills including problem solving and safety awareness, and psychosocial skills including coping strategies, environmental adaptation, and routines and behaviors.   IMPAIRMENTS: are limiting patient from ADLs, IADLs, work, leisure, and social participation.   COMORBIDITIES: may have co-morbidities  that affects occupational performance. Patient will benefit from skilled OT to address above impairments and improve overall function.  MODIFICATION OR ASSISTANCE TO COMPLETE EVALUATION: No modification of tasks or assist necessary to complete an evaluation.  OT OCCUPATIONAL PROFILE AND HISTORY: Problem focused assessment: Including review of records relating to presenting problem.  CLINICAL DECISION MAKING: LOW - limited treatment options, no task modification necessary  REHAB POTENTIAL: Excellent  EVALUATION COMPLEXITY: Low      PLAN:  OT FREQUENCY: 1-2x/week  OT DURATION: 6 weeks through 05/26/23 as needed up to 12 visits as needed   PLANNED INTERVENTIONS: self care/ADL training, therapeutic exercise, therapeutic activity, neuromuscular re-education, manual therapy,  passive range of motion, splinting, ultrasound, fluidotherapy, compression bandaging, moist heat, cryotherapy, contrast bath, patient/family education, energy conservation, coping strategies training, DME and/or AE instructions, Re-evaluation, and Dry needling  RECOMMENDED OTHER SERVICES: none now   CONSULTED AND AGREED WITH PLAN OF CARE: Patient  PLAN FOR NEXT SESSION:   05/04/23: UBE, functional use of right hand for reaching, grasp and release, FMC, consider 9 HPT, focus on LTG's, upgrade HEP as able.  Mariam Dollar Beth Dixon, OTR/L 05/04/2023, 12:07 PM

## 2023-05-09 ENCOUNTER — Encounter: Payer: Medicare Other | Admitting: Rehabilitative and Restorative Service Providers"

## 2023-05-11 ENCOUNTER — Encounter: Payer: Medicare Other | Admitting: Rehabilitative and Restorative Service Providers"

## 2023-05-11 ENCOUNTER — Ambulatory Visit (INDEPENDENT_AMBULATORY_CARE_PROVIDER_SITE_OTHER): Payer: Medicare Other | Admitting: Student

## 2023-05-11 ENCOUNTER — Encounter (HOSPITAL_BASED_OUTPATIENT_CLINIC_OR_DEPARTMENT_OTHER): Payer: Self-pay | Admitting: Student

## 2023-05-11 DIAGNOSIS — S52599A Other fractures of lower end of unspecified radius, initial encounter for closed fracture: Secondary | ICD-10-CM | POA: Diagnosis not present

## 2023-05-11 NOTE — Progress Notes (Signed)
Chief Complaint: Right wrist pain        History of Present Illness:   05/11/2023: Patient presents today for follow-up of right Colles' fracture that occurred on 12/16/2022.  She has been working with occupational therapy and reports that she has been seeing significant improvements.  Range of motion and grip strength has shown objective improvement per most recent OT note.  She reports no pain other than occasional soreness.     03/29/23: Patriece is here today for follow-up of her right wrist.  She reports that she is lacking range of motion in her wrist which she hoped would have been further along at this point.  She has been out of the wrist splint for the majority of the time for about a week.  Overall her wrist feels tight.  She does continue to have trouble holding objects that she is not able to form a fist.  Her pain levels are minimal without use of pain meds other than an occasional sharp pain.    Surgical History:   None   PMH/PSH/Family History/Social History/Meds/Allergies:         Past Medical History:  Diagnosis Date   Anxiety     B12 deficiency     FHx: heart disease 10/07/2018   High cholesterol     Hypertension     IBS (irritable bowel syndrome)     Vitamin D deficiency           Past Surgical History:  Procedure Laterality Date   AUGMENTATION MAMMAPLASTY       CATARACT EXTRACTION, BILATERAL Bilateral 2019    Dr. Darel Hong    COLONOSCOPY   03/30/2010    Dr.Stark   TUBAL LIGATION        Social History         Socioeconomic History   Marital status: Widowed      Spouse name: Not on file   Number of children: Not on file   Years of education: Not on file   Highest education level: Not on file  Occupational History   Not on file  Tobacco Use   Smoking status: Former      Packs/day: 0.75      Years: 40.00      Additional pack years: 0.00      Total pack years: 30.00      Types: Cigarettes   Smokeless tobacco: Never  Vaping Use   Vaping  Use: Never used  Substance and Sexual Activity   Alcohol use: Yes      Alcohol/week: 3.0 standard drinks of alcohol      Types: 3 Glasses of wine per week      Comment: Occ   Drug use: No   Sexual activity: Not on file  Other Topics Concern   Not on file  Social History Narrative   Not on file    Social Determinants of Health    Financial Resource Strain: Not on file  Food Insecurity: Not on file  Transportation Needs: Not on file  Physical Activity: Not on file  Stress: Not on file  Social Connections: Not on file         Family History  Problem Relation Age of Onset   Heart failure Mother     Osteoarthritis Father     Hypertension Father     COPD Father     Breast cancer Maternal Aunt     Colon cancer Neg Hx     Esophageal  cancer Neg Hx     Stomach cancer Neg Hx     Rectal cancer Neg Hx           Allergies  Allergen Reactions   Lipitor [Atorvastatin] Other (See Comments)      Fatigue.    No current facility-administered medications for this visit.          Current Outpatient Medications  Medication Sig Dispense Refill   ALPRAZolam (XANAX) 0.25 MG tablet Take 0.25 mg by mouth daily as needed for anxiety.       ezetimibe (ZETIA) 10 MG tablet Take  1 tablet  Daily  for Cholesterol 90 tablet 3   promethazine-dextromethorphan (PROMETHAZINE-DM) 6.25-15 MG/5ML syrup Take 5 mLs by mouth 4 (four) times daily as needed for cough. 240 mL 0   rosuvastatin (CRESTOR) 10 MG tablet Take 1 tablet Daily for Cholesterol 90 tablet 3             Facility-Administered Medications Ordered in Other Visits  Medication Dose Route Frequency Provider Last Rate Last Admin   lidocaine (XYLOCAINE) 2 % (with pres) injection 400 mg  20 mL Intradermal Once Loetta Rough, MD        Imaging Results (Last 48 hours)  No results found.     Review of Systems:   A ROS was performed including pertinent positives and negatives as documented in the HPI.   Physical Exam :   Constitutional: NAD  and appears stated age Neurological: Alert and oriented Psych: Appropriate affect and cooperative There were no vitals taken for this visit.    Comprehensive Musculoskeletal Exam:     No tenderness throughout the right wrist or hand.  Active ROM of the wrist from 45 degrees flexion to 30 degrees extension.  She is able to form a light fist with slight decrease in grip strength.  Radial pulse 2+.  Neurosensory exam intact.   Imaging:    Assessment:   72 y.o. female over 5 months status post displaced right Colles' fracture requiring reduction.  Bone healing has taken a prolonged period of time, however recent radiographs do show adequate bone formation.  As result of prolonged immobilization, she did develop significant deficits in strength and range of motion.  She does appear to be responding well with occupational therapy and has already seen positive returns of range of motion.  I would like her to continue with OT and recommend progression of strengthening as fracture is adequately healed.  At this point she can return to clinic as needed and continue progressing with therapy until discharge.   Plan :     -Continue with OT for ROM and strengthening -Return to clinic as needed      Hazle Nordmann, [facility]  I personally saw and evaluated the patient, and participated in the management and treatment plan.

## 2023-05-17 ENCOUNTER — Encounter: Payer: Medicare Other | Admitting: Rehabilitative and Restorative Service Providers"

## 2023-05-22 NOTE — Therapy (Signed)
OUTPATIENT OCCUPATIONAL THERAPY ORTHO TREATMENT & PROGRESS NOTE  Patient Name: Brittany Hanna MRN: [id number] DOB:[birth year], 72 y.o., female Today's Date: 05/24/2023  PCP: Lucky Cowboy, MD REFERRING PROVIDER:  Amador Cunas, PA-C     Progress Note Reporting Period 04/11/23 to 05/24/23.   CLINICAL IMPRESSION: 05/24/23: She reports her function greatly improved, her strength is improved in terms of muscle testing as well as grip, and her motion is improved from the elbow to the hand.  However she still has some deficits and has not met all goals.  Also she was only able to be seen 4 times over a 6-week, so she is in agreement to come in a bit more often now and work on remaining goals and deficits.  She may have a mild ulnar variance problem now, and compressive wrist wraps will be attempted, as well as upgrading her to strengthening and return to full activities including self-care fitness in a gym setting.  We will do 4 more weeks at 1 time a week to work towards these goals.  PLAN:  OT FREQUENCY: 1x/week  OT DURATION: 4 weeks; 05/26/23-06/23/23 as needed up to 4 additional visits     See note below for Objective Data and Assessment of Progress/Goals.        END OF SESSION:  OT End of Session - 05/24/23 1621     Visit Number 4    Number of Visits 8    Date for OT Re-Evaluation 06/23/23    Authorization Type Medicare    OT Start Time 1620    OT Stop Time 1714    OT Time Calculation (min) 54 min    Activity Tolerance Patient tolerated treatment well;No increased pain;Patient limited by pain;Patient limited by fatigue    Behavior During Therapy [facility] for tasks assessed/performed;Anxious               Past Medical History:  Diagnosis Date   Anxiety    B12 deficiency    FHx: heart disease 10/07/2018   High cholesterol    Hypertension    IBS (irritable bowel syndrome)    Vitamin D deficiency    Past Surgical History:  Procedure Laterality Date    AUGMENTATION MAMMAPLASTY     CATARACT EXTRACTION, BILATERAL Bilateral 2019   Dr. Darel Hong    COLONOSCOPY  03/30/2010   Dr.Stark   TUBAL LIGATION     Patient Active Problem List   Diagnosis Date Noted   LBBB  (2021)  02/14/2022   Osteoporosis 01/07/2022   B12 deficiency 07/22/2021   COPD (chronic obstructive pulmonary disease) (HCC) - per CXR 2010 01/07/2019   OAB (overactive bladder) 10/07/2018   Dysthymia 10/07/2018   Gastroesophageal reflux disease 10/07/2018   Aortic atherosclerosis (HCC) bny Abd CT scan on 10/30/2017 10/30/2017   Smoker 10/24/2017   Anxiety 10/24/2017   CKD (chronic kidney disease) stage 2, GFR 60-89 ml/min 10/23/2017   BMI 25.0-25.9,adult 10/23/2017   Hyperlipidemia, mixed 08/08/2013   Essential hypertension 08/08/2013   Vitamin D deficiency 08/08/2013   Abnormal glucose 08/08/2013    ONSET DATE: DOI 12/16/22  REFERRING DIAG: S52.599A (ICD-10-CM) - Fracture of distal end of radius with dorsal angulation   THERAPY DIAG:  Stiffness of right wrist, not elsewhere classified - Plan: Ot plan of care cert/re-cert  Muscle weakness (generalized) - Plan: Ot plan of care cert/re-cert  Stiffness of right hand, not elsewhere classified - Plan: Ot plan of care cert/re-cert  Pain in right wrist - Plan: Ot plan of  care cert/re-cert  Other lack of coordination - Plan: Ot plan of care cert/re-cert  Rationale for Evaluation and Treatment: Rehabilitation  SUBJECTIVE:   SUBJECTIVE STATEMENT: 05/24/23: 21+ weeks post colles fx now. She states feeling a bit sad that she does not think she is making much progress in terms of finger motion and that it is frustrating her hand feels stiffer when she wakes up in the morning.  She is willing to continue therapy as she thinks it helps  PERTINENT HISTORY: Per referral: "S/p right wrist fx-work on ROM and strengthening    PRECAUTIONS: None  RED FLAGS: None   WEIGHT BEARING RESTRICTIONS: No WBAT- non-painfully   PAIN:  Are  you having pain?   0/10 at rest, pt reports "sore" but not pain with exercises and functional use like "picking up my Yeti" Yes: NPRS scale: 2/10 at it's worst Pain location: Rt wrist Pain description: aching Aggravating factors: weight bearing and lack of motion Relieving factors: heat  FALLS: Has patient fallen in last 6 months? Yes. Number of falls 1 (this accident- no fall risk)  LIVING ENVIRONMENT: Lives with: lives with their family Lives in: House/apartment Has following equipment at home: None  PLOF: Independent  PATIENT GOALS: To improve the use of her right hand and arm for daily abilities   OBJECTIVE: (All objective assessments below are from initial evaluation on: 04/11/23 unless otherwise specified.)   HAND DOMINANCE: Right   ADLs: Overall ADLs: States decreased ability to grab, hold household objects, pain and difficulty to open containers, perform FMS tasks (manipulate fasteners on clothing), mild to moderate bathing problems as well.   FUNCTIONAL OUTCOME MEASURES: 05/24/23: Patient Rated Wrist Evaluation (PRWE): Pain: 14/50; Function: 4.5/50; Total Score: 18.5/100 (Higher Score  =  More Pain and/or Debility)    Eval: Patient Rated Wrist Evaluation (PRWE): Pain: 24/50; Function: 35.5/50; Total Score: 60/100 (Higher Score  =  More Pain and/or Debility)    UPPER EXTREMITY ROM     Shoulder to Wrist AROM Right eval Rt 05/24/23  Shoulder flexion    Shoulder abduction 150 160  Shoulder extension    Shoulder internal rotation    Shoulder external rotation    Elbow flexion 154 155  Elbow extension (-15) 0  Forearm supination 55 73  Forearm pronation  72 87  Wrist flexion 35 40 (50* after joint mobs)   Wrist extension 33 47 (54* after joint mobs)   Wrist ulnar deviation 5 16  Wrist radial deviation 11 15  Functional dart thrower's motion (F-DTM) in ulnar flexion 41 59  F-DTM in radial extension  45 48  (Blank rows = not tested)   Hand AROM Right eval Right  05/04/23 Rt 05/24/23  Full Fist Ability (or Gap to Distal Palmar Crease) 4cm gap from MF nail to [facility] 0.5 cm from [facility] after ex 2.3cm gap form MF tip to [facility]  Thumb Opposition  (Kapandji Scale)  8/10 WNL's   (Blank rows = not tested)   UPPER EXTREMITY MMT:     MMT Right eval Rt 05/24/23  Elbow flexion 5/5   Elbow extension 5/5   Forearm supination 4/5   Forearm pronation 4/5   Wrist flexion 4-/5 tender 4+/5 slight tender  Wrist extension 4/5 no pain 5/5  (Blank rows = not tested)  HAND FUNCTION: 05/24/23: 27#  05/04/23: Rt 20.9#, Lt 56.8#  Eval: Observed weakness in affected Rt hand.  Grip strength Right: 12 lbs, Left: 56 lbs    COORDINATION: 05/24/23: NT today  Eval: Observed coordination impairments with affected Rt hand. 9 Hole Peg Test Right: 28sec (~21 sec is WFL)   EDEMA:   Eval:  Mildly swollen in Rt hand and wrist today  COGNITION: Eval: Overall cognitive status: WFL for evaluation today, but some anxiety about needing and having to move Rt hand/wrist   OBSERVATIONS:   05/24/23: She is largely nontender about the ulnar styloid now, and only very mild tenderness with provocation to the radial styloid area.  Active supination is bothersome at the ulnar wrist but a axial load on the TFCC is not painful.  Her carpals do not seem to be gliding well, and she does remain a bit anxiously stiff in the hand and wrist.    Eval: She has overt anxiety when asked to move hand and wrist, but she is pleasantly surprised that she does not have much pain against therapist resistance etc.  She seems to have overt fear/guarding/disuse of right dominant hand and arm from wrist fracture and subsequent stiffness, poor coordination, weakness.   TODAY'S TREATMENT:  05/24/23: She starts with review of her home functional ability through the patient rated wrist evaluation and it is determined that she is actually doing much better than when she began.  This is encouraging for her and we talk  about her remaining deficits that seem to be mostly related to nagging pain on the ulnar but mostly radial side of the wrist.  She also states that her fingers are sore and painful especially when stretching them and after exercises, but she admits to not using any modalities as recommended.  OT reminds her to do nonpainful stretches, reminds her to do modalities of heat before and ice after as needed.  Additionally as the carpal rows do not seem to be gliding well, OT does manual therapy joint mobilizations to her wrist in flexion and extension and her amount of motion instantly improves.  Her pain with stretches somewhat improves as well.  She is taught how to do this with the strap and also with her other hand to be done at home as part of the HEP before other stretches.  She does well with thumb and states understanding but will need practice.  Additionally she has not gotten into any significant strengthening for the hand of the wrist yet so OT changes her grip training to be done isometrically and we will plan on doing advance strengthening for the wrist and arm in the upcoming month.  She performs active range of motion which does not show much improvement at the wrist unfortunately but great improvements at the elbow and forearm, and though her hand is stiff with initial motion after some gentle stretches she has nearly full passive range of motion which is a progress.  All the goals were addressed with her as well as her home exercise program which was "revamped" to include: -Joint mobilizations in wrist flexion and extension, more gentle wrist flexion and extension stretches, more gentle finger flexion stretches at the IP joints followed by composite finger stretch that is tolerable.  Grip training will be done isometrically 5 times for 10 seconds.  Modalities were also urged again.  Strength at the forearm and wrist will be upgraded in the next several sessions     PATIENT EDUCATION: Education  details: See tx section above for details  Person educated: Patient Education method: Verbal Instruction, Teach back, Handouts  Education comprehension: States and demonstrates understanding, Additional Education required   Home Exercise Program Access Code:  [email or web address] URL: [email or web address] Date: 05/04/2023 Prepared by: Mariam Dollar   GOALS: Goals reviewed with patient? Yes   SHORT TERM GOALS: (STG required if POC>30 days) Target Date: 04/28/23  Pt will obtain protective, custom orthotic. Goal status: N/A  2.  Pt will demo/state understanding of initial HEP to improve pain levels and prerequisite motion. Goal status: Met (04/20/23)   LONG TERM GOALS: Target Date: 05/26/23  Pt will improve functional ability by decreased impairment per PRWE assessment from 60/100 to 20/100 or better, for better quality of life. Goal status:05/24/23: MET 18/100  2.  Pt will improve grip strength in Rt hand from 12lbs to at least 45lbs for functional use at home and in IADLs. Goal status:05/24/23: Improved to 27# (28# is considered WFL)   3.  Pt will improve A/ROM in Rt wrist flex/ext from 35*/33* to at least 55* each, to have functional motion for tasks like reach and grasp.  Goal status:05/24/23: now 40/47* pre-treatment, improved   4.  Pt will improve strength in Rt wrist flex/ext from 4-/5 MMT to at least 4+/5 MMT to have increased functional ability to carry out selfcare and higher-level homecare tasks with less difficulty. Goal status:05/24/23: MET  5.  Pt will improve coordination skills in Rt hand/arm, as seen by better score on 9HPT testing to 23sec or better, to have increased functional ability to carry out fine motor tasks (fasteners, etc.) and more complex, coordinated IADLs (meal prep, sports, etc.).  Goal status:05/24/23: NT today  6.  Pt will decrease pain at worst from 8-9/10 to 3/10 or better to have better sleep and occupational participation in daily roles. Goal  status:05/24/23: improved to 5/10 at worst now   ASSESSMENT:  CLINICAL IMPRESSION: 05/24/23: She reports her function greatly improved, her strength is improved in terms of muscle testing as well as grip, and her motion is improved from the elbow to the hand.  However she still has some deficits and has not met all goals.  Also she was only able to be seen 4 times over a 6-week, so she is in agreement to come in a bit more often now and work on remaining goals and deficits.  She may have a mild ulnar variance problem now, and compressive wrist wraps will be attempted, as well as upgrading her to strengthening and return to full activities including self-care fitness in a gym setting.  We will do 4 more weeks at 1 time a week to work towards these goals.    PLAN:  OT FREQUENCY: 1x/week  OT DURATION: 4 weeks; 05/26/23-06/23/23 as needed up to 4 additional visits    PLANNED INTERVENTIONS: self care/ADL training, therapeutic exercise, therapeutic activity, neuromuscular re-education, manual therapy, passive range of motion, splinting, ultrasound, fluidotherapy, compression bandaging, moist heat, cryotherapy, contrast bath, patient/family education, energy conservation, coping strategies training, DME and/or AE instructions, Re-evaluation, and Dry needling   CONSULTED AND AGREED WITH PLAN OF CARE: Patient  PLAN FOR NEXT SESSION:   Check new joint mobilization techniques and perform other manual therapy as helpful, use modalities for pain and tissue extensibility, check grip training and upgrade wrist and forearm strengthening within the next 1-3 sessions.  Fannie Knee, OTR/L 05/24/2023, 5:58 PM

## 2023-05-24 ENCOUNTER — Encounter: Payer: Self-pay | Admitting: Rehabilitative and Restorative Service Providers"

## 2023-05-24 ENCOUNTER — Ambulatory Visit (INDEPENDENT_AMBULATORY_CARE_PROVIDER_SITE_OTHER): Payer: Medicare Other | Admitting: Rehabilitative and Restorative Service Providers"

## 2023-05-24 DIAGNOSIS — M25631 Stiffness of right wrist, not elsewhere classified: Secondary | ICD-10-CM

## 2023-05-24 DIAGNOSIS — M25641 Stiffness of right hand, not elsewhere classified: Secondary | ICD-10-CM | POA: Diagnosis not present

## 2023-05-24 DIAGNOSIS — M6281 Muscle weakness (generalized): Secondary | ICD-10-CM

## 2023-05-24 DIAGNOSIS — R278 Other lack of coordination: Secondary | ICD-10-CM | POA: Diagnosis not present

## 2023-05-24 DIAGNOSIS — M25531 Pain in right wrist: Secondary | ICD-10-CM

## 2023-05-29 ENCOUNTER — Ambulatory Visit: Payer: Medicare Other | Admitting: Nurse Practitioner

## 2023-05-31 NOTE — Therapy (Addendum)
 OUTPATIENT OCCUPATIONAL THERAPY ORTHO TREATMENT & DISCHARGE NOTE  Patient Name: Brittany Hanna MRN: [id number] DOB:Aug 29, 1951, 72 y.o., female Today's Date: 06/01/2023  PCP: Tonita Fallow, MD REFERRING PROVIDER:  Emiliano Leonce CROME, PA-C               OCCUPATIONAL THERAPY DISCHARGE SUMMARY  Visits from Start of Care: 5  Pt did not return to therapy, so goals could not be addressed.   Pt now officially discharged form OP OT.  See note below for additional details.   Melvenia Ada, OTR/L, CHT 05/02/24             END OF SESSION:  OT End of Session - 06/01/23 1109     Visit Number 5    Number of Visits 8    Date for OT Re-Evaluation 06/23/23    Authorization Type Medicare    Authorization - Visit Number 5    Authorization - Number of Visits 8    OT Start Time 1109    OT Stop Time 1208    OT Time Calculation (min) 59 min    Equipment Utilized During Treatment green t-putty, red & greent-bands    Activity Tolerance Patient tolerated treatment well;No increased pain;Patient limited by fatigue    Behavior During Therapy [facility] for tasks assessed/performed;Anxious                Past Medical History:  Diagnosis Date   Anxiety    B12 deficiency    FHx: heart disease 10/07/2018   High cholesterol    Hypertension    IBS (irritable bowel syndrome)    Vitamin D  deficiency    Past Surgical History:  Procedure Laterality Date   AUGMENTATION MAMMAPLASTY     CATARACT EXTRACTION, BILATERAL Bilateral 2019   Dr. Milan    COLONOSCOPY  03/30/2010   Dr.Stark   TUBAL LIGATION     Patient Active Problem List   Diagnosis Date Noted   LBBB  (2021)  02/14/2022   Osteoporosis 01/07/2022   B12 deficiency 07/22/2021   COPD (chronic obstructive pulmonary disease) (HCC) - per CXR 2010 01/07/2019   OAB (overactive bladder) 10/07/2018   Dysthymia 10/07/2018   Gastroesophageal reflux disease 10/07/2018   Aortic atherosclerosis (HCC) bny Abd CT scan on  10/30/2017 10/30/2017   Smoker 10/24/2017   Anxiety 10/24/2017   CKD (chronic kidney disease) stage 2, GFR 60-89 ml/min 10/23/2017   BMI 25.0-25.9,adult 10/23/2017   Hyperlipidemia, mixed 08/08/2013   Essential hypertension 08/08/2013   Vitamin D  deficiency 08/08/2013   Abnormal glucose 08/08/2013    ONSET DATE: DOI 12/16/22  REFERRING DIAG: S52.599A (ICD-10-CM) - Fracture of distal end of radius with dorsal angulation   THERAPY DIAG:  Stiffness of right wrist, not elsewhere classified  Muscle weakness (generalized)  Pain in right wrist  Other lack of coordination  Stiffness of right hand, not elsewhere classified  Rationale for Evaluation and Treatment: Rehabilitation  PERTINENT HISTORY: Per referral: S/p right wrist fx-work on ROM and strengthening    PRECAUTIONS: None  RED FLAGS: None   WEIGHT BEARING RESTRICTIONS: No WBAT- non-painfully      SUBJECTIVE:   SUBJECTIVE STATEMENT: 05/24/23: 21+ weeks post colles fx now. She states working more, stretching less, feeling about the same.      PAIN:  Are you having pain?   0/10 at rest, pt reports sore but not pain with exercises and functional use like picking up my Yeti Yes: NPRS scale: 2/10 at it's worst Pain location: Rt wrist  Pain description: aching Aggravating factors: weight bearing and lack of motion Relieving factors: heat  FALLS: Has patient fallen in last 6 months? Yes. Number of falls 1 (this accident- no fall risk)  LIVING ENVIRONMENT: Lives with: lives with their family Lives in: House/apartment Has following equipment at home: None  PLOF: Independent  PATIENT GOALS: To improve the use of her right hand and arm for daily abilities   OBJECTIVE: (All objective assessments below are from initial evaluation on: 04/11/23 unless otherwise specified.)   HAND DOMINANCE: Right   ADLs: Overall ADLs: States decreased ability to grab, hold household objects, pain and difficulty to open  containers, perform FMS tasks (manipulate fasteners on clothing), mild to moderate bathing problems as well.   FUNCTIONAL OUTCOME MEASURES: 05/24/23: Patient Rated Wrist Evaluation (PRWE): Pain: 14/50; Function: 4.5/50; Total Score: 18.5/100 (Higher Score  =  More Pain and/or Debility)    Eval: Patient Rated Wrist Evaluation (PRWE): Pain: 24/50; Function: 35.5/50; Total Score: 60/100 (Higher Score  =  More Pain and/or Debility)    UPPER EXTREMITY ROM     Shoulder to Wrist AROM Right eval Rt 05/24/23 Rt 06/01/23  Shoulder flexion     Shoulder abduction 150 160   Shoulder extension     Shoulder internal rotation     Shoulder external rotation     Elbow flexion 154 155   Elbow extension (-15) 0   Forearm supination 55 73   Forearm pronation  72 87   Wrist flexion 35 40 (50* after joint mobs)  45*  Wrist extension 33 47 (54* after joint mobs)  46*  Wrist ulnar deviation 5 16   Wrist radial deviation 11 15   Functional dart thrower's motion (F-DTM) in ulnar flexion 41 59   F-DTM in radial extension  45 48   (Blank rows = not tested)   Hand AROM Right eval Right 05/04/23 Rt 05/24/23  Full Fist Ability (or Gap to Distal Palmar Crease) 4cm gap from MF nail to [facility] 0.5 cm from [facility] after ex 2.3cm gap form MF tip to [facility]  Thumb Opposition  (Kapandji Scale)  8/10 WNL's   (Blank rows = not tested)   UPPER EXTREMITY MMT:     MMT Right eval Rt 05/24/23  Elbow flexion 5/5   Elbow extension 5/5   Forearm supination 4/5   Forearm pronation 4/5   Wrist flexion 4-/5 tender 4+/5 slight tender  Wrist extension 4/5 no pain 5/5  (Blank rows = not tested)  HAND FUNCTION: 05/24/23: 27#  05/04/23: Rt 20.9#, Lt 56.8#  Eval: Observed weakness in affected Rt hand.  Grip strength Right: 12 lbs, Left: 56 lbs    COORDINATION: 05/24/23: NT today  Eval: Observed coordination impairments with affected Rt hand. 9 Hole Peg Test Right: 28sec (~21 sec is WFL)   EDEMA:   Eval:  Mildly swollen in  Rt hand and wrist today  COGNITION: Eval: Overall cognitive status: WFL for evaluation today, but some anxiety about needing and having to move Rt hand/wrist   OBSERVATIONS:   05/24/23: She is largely nontender about the ulnar styloid now, and only very mild tenderness with provocation to the radial styloid area.  Active supination is bothersome at the ulnar wrist but a axial load on the TFCC is not painful.  Her carpals do not seem to be gliding well, and she does remain a bit anxiously stiff in the hand and wrist.    Eval: She has overt anxiety when asked to move  hand and wrist, but she is pleasantly surprised that she does not have much pain against therapist resistance etc.  She seems to have overt fear/guarding/disuse of right dominant hand and arm from wrist fracture and subsequent stiffness, poor coordination, weakness.   TODAY'S TREATMENT:  06/01/23: She starts with range of motion for updated measures which does show slight improvement in wrist flexion but no significant changes.  OT then put on moist heat for 5 minutes while updating and explaining a new home exercise program to her.  This is listed below and contains all necessary stretches for the wrist, forearm, and fingers.  It also contains updated strengthening portions for the entire extremity.  Additionally she was given upgraded green therapy putty today to increase resistance for pinch and grip training which she tolerated well, but with difficulty.  After moist heat, OT does manual therapy joint mobilizations and long wrist stretches in flexion and extension and also at the fingers with extra explanation for home performance.  She then performs strengthening portion of HEP back with OT giving cues and additional education and supervision as needed.  At the end of the session she leaves in no significant pain stating understanding this new program.  We have 3 additional visits to work on this program and improve her motion and  functional strength.   Exercises - Wrist Flexion Stretch  - 3-4 x daily - 5 reps - 15 sec hold - Seated Wrist Extension Stretch  - 3-4 x daily - 5 reps - 15 hold - Hammer Stretch or Strength   - 2-4 x daily - 1-2 sets - 10-15 reps - HOOK Stretch  - 4 x daily - 3-5 reps - 15-20 sec hold - Seated Finger Composite Flexion Stretch  - 4 x daily - 3-5 reps - 15 hold - Hammer Stretch or Strength   - 2-4 x daily - 1-2 sets - 10-15 reps - Wrist Extension with Resistance  - 1-2 x daily - 4-5 x weekly - 1-2 sets - 10-15 reps - Wrist Flexion with Resistance  - 1-2 x daily - 4-5 x weekly - 1-2 sets - 10-15 reps - Putty Squeezes  - 4 x daily - 7 x weekly - 1 sets - 5 reps - Seated Claw Fist with Putty  - 2-3 x daily - 5 reps - Thumb Opposition with Putty  - 2-3 x daily - 5 reps - Standing Bicep Curls with Resistance  - 2-4 x daily - 1-2 sets - 10-15 reps - Tricep Kick Back with Resistance  - 2-4 x daily - 1-2 sets - 10-15 reps   PATIENT EDUCATION: Education details: See tx section above for details  Person educated: Patient Education method: Verbal Instruction, Teach back, Handouts  Education comprehension: States and demonstrates understanding, Additional Education required   Home Exercise Program Access Code: [email or web address] URL: [email or web address] Date: 06/01/2023 Prepared by: Melvenia Ada   GOALS: Goals reviewed with patient? Yes   SHORT TERM GOALS: (STG required if POC>30 days) Target Date: 04/28/23  Pt will obtain protective, custom orthotic. Goal status: N/A  2.  Pt will demo/state understanding of initial HEP to improve pain levels and prerequisite motion. Goal status: Met (04/20/23)   LONG TERM GOALS: Target Date: 06/23/23  Pt will improve functional ability by decreased impairment per PRWE assessment from 60/100 to 20/100 or better, for better quality of life. Goal status:05/24/23: MET 18/100  2.  Pt will improve grip strength in Rt hand from 12lbs to at  least  45lbs for functional use at home and in IADLs. Goal status:05/24/23: Improved to 27# (28# is considered WFL)   3.  Pt will improve A/ROM in Rt wrist flex/ext from 35*/33* to at least 55* each, to have functional motion for tasks like reach and grasp.  Goal status:05/24/23: now 40/47* pre-treatment, improved   4.  Pt will improve strength in Rt wrist flex/ext from 4-/5 MMT to at least 4+/5 MMT to have increased functional ability to carry out selfcare and higher-level homecare tasks with less difficulty. Goal status:05/24/23: MET  5.  Pt will improve coordination skills in Rt hand/arm, as seen by better score on 9HPT testing to 23sec or better, to have increased functional ability to carry out fine motor tasks (fasteners, etc.) and more complex, coordinated IADLs (meal prep, sports, etc.).  Goal status:05/24/23: NT today  6.  Pt will decrease pain at worst from 8-9/10 to 3/10 or better to have better sleep and occupational participation in daily roles. Goal status:05/24/23: improved to 5/10 at worst now   ASSESSMENT:  CLINICAL IMPRESSION: 06/01/23: Although her progress has somewhat stalled, she was given a fully detailed program today and new strengthening portion and we will work on her functional tolerance and endurance to get through all of her out life activities to the best of our ability.  Will continue on her remaining 3 sessions and reassess as needed  PLAN:  OT FREQUENCY: 1x/week  OT DURATION: 4 weeks; 05/26/23-06/23/23 as needed up to 4 additional visits    PLANNED INTERVENTIONS: self care/ADL training, therapeutic exercise, therapeutic activity, neuromuscular re-education, manual therapy, passive range of motion, splinting, ultrasound, fluidotherapy, compression bandaging, moist heat, cryotherapy, contrast bath, patient/family education, energy conservation, coping strategies training, DME and/or AE instructions, Re-evaluation, and Dry needling   CONSULTED AND AGREED WITH PLAN  OF CARE: Patient  PLAN FOR NEXT SESSION:   Review complete new home exercise program and especially strengthening portions.  Use heat manual therapy and functional strengthening and exercises.  Melvenia Ada, OTR/L 06/01/2023, 12:28 PM

## 2023-06-01 ENCOUNTER — Ambulatory Visit (INDEPENDENT_AMBULATORY_CARE_PROVIDER_SITE_OTHER): Payer: Medicare Other | Admitting: Rehabilitative and Restorative Service Providers"

## 2023-06-01 ENCOUNTER — Encounter: Payer: Self-pay | Admitting: Rehabilitative and Restorative Service Providers"

## 2023-06-01 DIAGNOSIS — R278 Other lack of coordination: Secondary | ICD-10-CM

## 2023-06-01 DIAGNOSIS — M25531 Pain in right wrist: Secondary | ICD-10-CM

## 2023-06-01 DIAGNOSIS — M6281 Muscle weakness (generalized): Secondary | ICD-10-CM

## 2023-06-01 DIAGNOSIS — M25631 Stiffness of right wrist, not elsewhere classified: Secondary | ICD-10-CM

## 2023-06-01 DIAGNOSIS — M25641 Stiffness of right hand, not elsewhere classified: Secondary | ICD-10-CM | POA: Diagnosis not present

## 2023-06-07 ENCOUNTER — Encounter: Payer: Medicare Other | Admitting: Rehabilitative and Restorative Service Providers"

## 2023-06-12 ENCOUNTER — Encounter: Payer: Self-pay | Admitting: Nurse Practitioner

## 2023-06-12 ENCOUNTER — Ambulatory Visit (INDEPENDENT_AMBULATORY_CARE_PROVIDER_SITE_OTHER): Payer: Medicare Other | Admitting: Nurse Practitioner

## 2023-06-12 VITALS — BP 130/68 | HR 71 | Temp 97.8°F | Ht 70.0 in | Wt 181.4 lb

## 2023-06-12 DIAGNOSIS — I700 Atherosclerosis of aorta: Secondary | ICD-10-CM | POA: Diagnosis not present

## 2023-06-12 DIAGNOSIS — N182 Chronic kidney disease, stage 2 (mild): Secondary | ICD-10-CM | POA: Diagnosis not present

## 2023-06-12 DIAGNOSIS — E782 Mixed hyperlipidemia: Secondary | ICD-10-CM

## 2023-06-12 DIAGNOSIS — M810 Age-related osteoporosis without current pathological fracture: Secondary | ICD-10-CM

## 2023-06-12 DIAGNOSIS — F17200 Nicotine dependence, unspecified, uncomplicated: Secondary | ICD-10-CM | POA: Diagnosis not present

## 2023-06-12 DIAGNOSIS — I10 Essential (primary) hypertension: Secondary | ICD-10-CM

## 2023-06-12 DIAGNOSIS — R7309 Other abnormal glucose: Secondary | ICD-10-CM | POA: Diagnosis not present

## 2023-06-12 DIAGNOSIS — Z79899 Other long term (current) drug therapy: Secondary | ICD-10-CM | POA: Diagnosis not present

## 2023-06-12 DIAGNOSIS — Z6825 Body mass index (BMI) 25.0-25.9, adult: Secondary | ICD-10-CM

## 2023-06-12 DIAGNOSIS — F341 Dysthymic disorder: Secondary | ICD-10-CM

## 2023-06-12 DIAGNOSIS — J42 Unspecified chronic bronchitis: Secondary | ICD-10-CM

## 2023-06-12 DIAGNOSIS — E2839 Other primary ovarian failure: Secondary | ICD-10-CM

## 2023-06-12 DIAGNOSIS — E559 Vitamin D deficiency, unspecified: Secondary | ICD-10-CM

## 2023-06-12 DIAGNOSIS — F419 Anxiety disorder, unspecified: Secondary | ICD-10-CM

## 2023-06-12 DIAGNOSIS — N3281 Overactive bladder: Secondary | ICD-10-CM | POA: Diagnosis not present

## 2023-06-12 NOTE — Progress Notes (Signed)
FOLLOW UP  Assessment:   Diagnoses and all orders for this visit:  Atherosclerosis of aorta (HCC) - per CT 10/2017 Control blood pressure, cholesterol, glucose, increase exercise.   COPD (HCC) - CXR 2010 No inhaler use - quit date 06/2022 Currently well controlled.  Essential hypertension Discussed DASH (Dietary Approaches to Stop Hypertension) DASH diet is lower in sodium than a typical American diet. Cut back on foods that are high in saturated fat, cholesterol, and trans fats. Eat more whole-grain foods, fish, poultry, and nuts Remain active and exercise as tolerated daily.  Monitor BP at home-Call if greater than 130/80.  Check and monitor CMP/CBC   Abnormal glucose Education: Reviewed 'ABCs' of diabetes management  Discussed goals to be met and/or maintained include A1C (<7) Blood pressure (<130/80) Cholesterol (LDL <70) Continue Eye Exam yearly  Continue Dental Exam Q6 mo Discussed dietary recommendations Discussed Physical Activity recommendations  Medication management All medications discussed and reviewed in full. All questions and concerns regarding medications addressed.    Mixed hyperlipidemia Discussed lifestyle modifications moving forward - alternative treatments versus prescription medications.  Recommended diet heavy in fruits and veggies, omega 3's. Decrease consumption of animal meats, cheeses, and dairy products. Remain active and exercise as tolerated. Continue to monitor.  Vitamin D deficiency Continue supplementation for goal of 60-100 Check  and monitor Vitamin D  CKD (chronic kidney disease), symptom management only, stage 2 (mild) Stay well hydrated. Avoid high salt foods. Avoid NSAIDS. Keep BP and BG well controlled.   Take medications as prescribed. Remain active and exercise as tolerated daily. Maintain weight.  Continue to monitor.  BMI 25.0-25.9, adult At goal Recommended diet heavy in fruits and veggies and low in animal  meats, cheeses, and dairy products, appropriate calorie intake Discussed exercise recommendations Continue to monitor at each visit  OAB Takes oxybutynin PRN and doing well  Continue to monitor  Estrogen deficiency Continue DEXA screening Continue to monitor  Smoker Cessation as of 07/2022  Anxiety/dysthymia Continue medications. Reviewed relaxation techniques.  Sleep hygiene. Recommended mindfulness meditation and exercise.   Limit/Decrease/Monitor drug/alcohol intake.    Osteoporosis Pursue a combination of weight-bearing exercises and strength training. Advised on fall prevention measures including proper lighting in all rooms, removal of area rugs and floor clutter, use of walking devices as deemed appropriate, avoidance of uneven walking surfaces. Smoking cessation and moderate alcohol consumption if applicable Consume 800 to 1000 IU of vitamin D daily with a goal vitamin D serum value of 30 ng/mL or higher. Aim for 1000 to 1200 mg of elemental calcium daily through supplements and/or dietary sources.  Orders Placed This Encounter  Procedures   Lipid panel    Notify office for further evaluation and treatment, questions or concerns if any reported s/s fail to improve.   The patient was advised to call back or seek an in-person evaluation if any symptoms worsen or if the condition fails to improve as anticipated.   Further disposition pending results of labs. Discussed med's effects and SE's.    I discussed the assessment and treatment plan with the patient. The patient was provided an opportunity to ask questions and all were answered. The patient agreed with the plan and demonstrated an understanding of the instructions.  Discussed med's effects and SE's. Screening labs and tests as requested with regular follow-up as recommended.  I provided 30 minutes of face-to-face time during this encounter including counseling, chart review, and critical decision making was  preformed.  Future Appointments  Date Time  Provider Department Center  08/28/2023 11:30 AM Lucky Cowboy, MD [facility] None  03/11/2024  3:00 PM Lucky Cowboy, MD [facility] None    Subjective:  Brittany Hanna is a 72 y.o. female who presents for Medicare Annual Wellness Visit and 3 month follow up. She has Hyperlipidemia, mixed; Essential hypertension; Vitamin D deficiency; Abnormal glucose; CKD (chronic kidney disease) stage 2, GFR 60-89 ml/min; BMI 25.0-25.9,adult; Smoker; Anxiety; Aortic atherosclerosis (HCC) bny Abd CT scan on 10/30/2017; OAB (overactive bladder); Dysthymia; Gastroesophageal reflux disease; COPD (chronic obstructive pulmonary disease) (HCC) - per CXR 2010; B12 deficiency; Osteoporosis; and LBBB  (2021)  on their problem list.  Overall she reports feeling well today.  She has no new concerns at this time.  She ha quit smoking x1 year (07/2022).  She denies any residual effects including DOE, SOB, wheezing, cough.  She smoked 1 pack a day; She has 40 pack/year history; Completed low dose lung cancer screening last CT Screening 08/2021 Negative.  She has OAB on oxybutynin PRN and does well taking on as needed, typically takes if she will be out in public for extended perids.    She has hx of depression/anxiety well controlled on zoloft 50 mg daily. Has PRN xanax, uses rarely, only every few months.   she has a diagnosis of GERD which is currently managed by omeprazole 40 mg PRN, takes occasionally only and reports well controlled sx.   BMI is Body mass index is 26.03 kg/m., she has been working on diet and exercise. Avoids meat, focuses on vegetables and fruit. Walks when weather permits.  Wt Readings from Last 3 Encounters:  06/12/23 181 lb 6.4 oz (82.3 kg)  02/16/23 176 lb (79.8 kg)  12/16/22 160 lb (72.6 kg)   She does not check her BP at home, today their BP is BP: 130/68 She does workout. She denies chest pain, shortness of breath, dizziness.  She had  negative cardiolite in 2010.   She is no longer on cholesterol medication (rosuvastatin 20 mg daily).  Severe reaction to higher dose statin in the past. Reports SE of falling when she took the medication.  She is trying to control through lifestyle modifications.  Her LDL cholesterol is at goal. The cholesterol last visit was:   Lab Results  Component Value Date   CHOL 198 02/16/2023   HDL 66 02/16/2023   LDLCALC 110 (H) 02/16/2023   TRIG 112 02/16/2023   CHOLHDL 3.0 02/16/2023    She has been working on diet and exercise for glucose management, and denies increased appetite, nausea, paresthesia of the feet, polydipsia, polyuria, visual disturbances, vomiting and weight loss. Last A1C in the office was:  Lab Results  Component Value Date   HGBA1C 5.0 02/16/2023    Last GFR: Lab Results  Component Value Date   GFRNONAA 68 02/02/2021   Patient is on Vitamin D supplement but remains well below goal of 60:   Lab Results  Component Value Date   VD25OH 45 02/16/2023     She has B12 supplement, hasn't been taking.  Lab Results  Component Value Date   VITAMINB12 360 02/16/2023     Medication Review: Current Outpatient Medications on File Prior to Visit  Medication Sig Dispense Refill   ALPRAZolam (XANAX) 0.25 MG tablet Take 0.25 mg by mouth daily as needed for anxiety. (Patient not taking: Reported on 06/12/2023)     rosuvastatin (CRESTOR) 10 MG tablet Take 1 tablet Daily for Cholesterol (Patient not taking: Reported on 06/12/2023) 90  tablet 3   No current facility-administered medications on file prior to visit.    Allergies  Allergen Reactions   Lipitor [Atorvastatin] Other (See Comments)    Fatigue.    Current Problems (verified) Patient Active Problem List   Diagnosis Date Noted   LBBB  (2021)  02/14/2022   Osteoporosis 01/07/2022   B12 deficiency 07/22/2021   COPD (chronic obstructive pulmonary disease) (HCC) - per CXR 2010 01/07/2019   OAB (overactive bladder)  10/07/2018   Dysthymia 10/07/2018   Gastroesophageal reflux disease 10/07/2018   Aortic atherosclerosis (HCC) bny Abd CT scan on 10/30/2017 10/30/2017   Smoker 10/24/2017   Anxiety 10/24/2017   CKD (chronic kidney disease) stage 2, GFR 60-89 ml/min 10/23/2017   BMI 25.0-25.9,adult 10/23/2017   Hyperlipidemia, mixed 08/08/2013   Essential hypertension 08/08/2013   Vitamin D deficiency 08/08/2013   Abnormal glucose 08/08/2013    Screening Tests Immunization History  Administered Date(s) Administered   Fluad Quad(high Dose 65+) 07/10/2020   Influenza, High Dose Seasonal PF 06/29/2018, 06/28/2019, 07/05/2021   Influenza-Unspecified 07/04/2022   PFIZER(Purple Top)SARS-COV-2 Vaccination 04/16/2020, 05/07/2020, 06/11/2021   PPD Test 03/19/2014, 03/24/2015, 06/01/2016   Pneumococcal Conjugate-13 07/24/2017   Pneumococcal Polysaccharide-23 06/01/2016   Tdap 01/24/2013   Zoster, Live 11/03/2013    Patient Care Team: Lucky Cowboy, MD as PCP - General (Internal Medicine)  SURGICAL HISTORY She  has a past surgical history that includes Tubal ligation; Cataract extraction, bilateral (Bilateral, 2019); Augmentation mammaplasty; and Colonoscopy (03/30/2010). FAMILY HISTORY Her family history includes Breast cancer in her maternal aunt; COPD in her father; Heart failure in her mother; Hypertension in her father; Osteoarthritis in her father. SOCIAL HISTORY She  reports that she has quit smoking. Her smoking use included cigarettes. She has a 30 pack-year smoking history. She has never used smokeless tobacco. She reports current alcohol use of about 3.0 standard drinks of alcohol per week. She reports that she does not use drugs.   Review of Systems  Constitutional:  Negative for malaise/fatigue and weight loss.  HENT:  Negative for hearing loss and tinnitus.   Eyes:  Negative for blurred vision and double vision.  Respiratory:  Negative for cough, shortness of breath and wheezing.    Cardiovascular:  Negative for chest pain, palpitations, orthopnea, claudication and leg swelling.  Gastrointestinal:  Negative for abdominal pain, blood in stool, constipation, diarrhea, heartburn, melena, nausea and vomiting.  Genitourinary: Negative.   Musculoskeletal:  Negative for joint pain and myalgias.  Skin:  Negative for rash.  Neurological:  Negative for dizziness, tingling, sensory change, weakness and headaches.  Endo/Heme/Allergies:  Negative for polydipsia.  Psychiatric/Behavioral: Negative.    All other systems reviewed and are negative.    Objective:     Today's Vitals   06/12/23 1100  BP: 130/68  Pulse: 71  Temp: 97.8 F (36.6 C)  SpO2: 98%  Weight: 181 lb 6.4 oz (82.3 kg)  Height: 5\' 10"  (1.778 m)    Body mass index is 26.03 kg/m.  General appearance: alert, no distress, WD/WN, female HEENT: normocephalic, sclerae anicteric, TMs pearly, nares patent, no discharge or erythema, pharynx normal Oral cavity: MMM, no lesions Neck: supple, no lymphadenopathy, no thyromegaly, no masses Heart: RRR, normal S1, S2, no murmurs Lungs: CTA bilaterally, no wheezes, rhonchi, or rales Abdomen: +bs, soft, non tender, non distended, no masses, no hepatomegaly, no splenomegaly Musculoskeletal: nontender, no swelling, no obvious deformity Extremities: no edema, no cyanosis, no clubbing Pulses: 2+ symmetric, upper and lower extremities, normal cap refill  Neurological: alert, oriented x 3, CN2-12 intact, strength normal upper extremities and lower extremities, sensation normal throughout, DTRs 2+ throughout, no cerebellar signs, gait normal Psychiatric: normal affect, behavior normal, pleasant     Adela Glimpse, NP   06/12/2023

## 2023-06-12 NOTE — Patient Instructions (Signed)
Dyslipidemia Dyslipidemia is an imbalance of waxy, fat-like substances (lipids) in the blood. The body needs lipids in small amounts. Dyslipidemia often involves a high level of cholesterol or triglycerides, which are types of lipids. Common forms of dyslipidemia include: High levels of LDL cholesterol. LDL is the type of cholesterol that causes fatty deposits (plaques) to build up in the blood vessels that carry blood away from the heart (arteries). Low levels of HDL cholesterol. HDL cholesterol is the type of cholesterol that protects against heart disease. High levels of HDL remove the LDL buildup from arteries. High levels of triglycerides. Triglycerides are a fatty substance in the blood that is linked to a buildup of plaques in the arteries. What are the causes? There are two main types of dyslipidemia: primary and secondary. Primary dyslipidemia is caused by changes (mutations) in genes that are passed down through families (inherited). These mutations cause several types of dyslipidemia. Secondary dyslipidemia may be caused by various risk factors that can lead to the disease, such as lifestyle choices and certain medical conditions. What increases the risk? You are more likely to develop this condition if you are an older man or if you are a woman who has gone through menopause. Other risk factors include: Having a family history of dyslipidemia. Taking certain medicines, including birth control pills, steroids, some diuretics, and beta-blockers. Eating a diet high in saturated fat. Smoking cigarettes or excessive alcohol intake. Having certain medical conditions such as diabetes, polycystic ovary syndrome (PCOS), kidney disease, liver disease, or hypothyroidism. Not exercising regularly. Being overweight or obese with too much belly fat. What are the signs or symptoms? In most cases, dyslipidemia does not usually cause any symptoms. In severe cases, very high lipid levels can  cause: Fatty bumps under the skin (xanthomas). A white or gray ring around the black center (pupil) of the eye. Very high triglyceride levels can cause inflammation of the pancreas (pancreatitis). How is this diagnosed? Your health care provider may diagnose dyslipidemia based on a routine blood test (fasting blood test). Because most people do not have symptoms of the condition, this blood testing (lipid profile) is done on adults age 20 and older and is repeated every 4-6 years. This test checks: Total cholesterol. This measures the total amount of cholesterol in your blood, including LDL cholesterol, HDL cholesterol, and triglycerides. A healthy number is below 200 mg/dL (5.17 mmol/L). LDL cholesterol. The target number for LDL cholesterol is different for each person, depending on individual risk factors. A healthy number is usually below 100 mg/dL (2.59 mmol/L). Ask your health care provider what your LDL cholesterol should be. HDL cholesterol. An HDL level of 60 mg/dL (1.55 mmol/L) or higher is best because it helps to protect against heart disease. A number below 40 mg/dL (1.03 mmol/L) for men or below 50 mg/dL (1.29 mmol/L) for women increases the risk for heart disease. Triglycerides. A healthy triglyceride number is below 150 mg/dL (1.69 mmol/L). If your lipid profile is abnormal, your health care provider may do other blood tests. How is this treated? Treatment depends on the type of dyslipidemia that you have and your other risk factors for heart disease and stroke. Your health care provider will have a target range for your lipid levels based on this information. Treatment for dyslipidemia starts with lifestyle changes, such as diet and exercise. Your health care provider may recommend that you: Get regular exercise. Make changes to your diet. Quit smoking if you smoke. Limit your alcohol intake. If diet   changes and exercise do not help you reach your goals, your health care provider  may also prescribe medicine to lower lipids. The most commonly prescribed type of medicine lowers your LDL cholesterol (statin drug). If you have a high triglyceride level, your provider may prescribe another type of drug (fibrate) or an omega-3 fish oil supplement, or both. Follow these instructions at home: Eating and drinking  Follow instructions from your health care provider or dietitian about eating or drinking restrictions. Eat a healthy diet as told by your health care provider. This can help you reach and maintain a healthy weight, lower your LDL cholesterol, and raise your HDL cholesterol. This may include: Limiting your calories, if you are overweight. Eating more fruits, vegetables, whole grains, fish, and lean meats. Limiting saturated fat, trans fat, and cholesterol. Do not drink alcohol if: Your health care provider tells you not to drink. You are pregnant, may be pregnant, or are planning to become pregnant. If you drink alcohol: Limit how much you have to: 0-1 drink a day for women. 0-2 drinks a day for men. Know how much alcohol is in your drink. In the [place] one drink equals one 12 oz bottle of beer (355 mL), one 5 oz glass of wine (148 mL), or one 1 oz glass of hard liquor (44 mL). Activity Get regular exercise. Start an exercise and strength training program as told by your health care provider. Ask your health care provider what activities are safe for you. Your health care provider may recommend: 30 minutes of aerobic activity 4-6 days a week. Brisk walking is an example of aerobic activity. Strength training 2 days a week. General instructions Do not use any products that contain nicotine or tobacco. These products include cigarettes, chewing tobacco, and vaping devices, such as e-cigarettes. If you need help quitting, ask your health care provider. Take over-the-counter and prescription medicines only as told by your health care provider. This includes  supplements. Keep all follow-up visits. This is important. Contact a health care provider if: You are having trouble sticking to your exercise or diet plan. You are struggling to quit smoking or to control your use of alcohol. Summary Dyslipidemia often involves a high level of cholesterol or triglycerides, which are types of lipids. Treatment depends on the type of dyslipidemia that you have and your other risk factors for heart disease and stroke. Treatment for dyslipidemia starts with lifestyle changes, such as diet and exercise. Your health care provider may prescribe medicine to lower lipids. This information is not intended to replace advice given to you by your health care provider. Make sure you discuss any questions you have with your health care provider. Document Revised: 03/25/2022 Document Reviewed: 10/26/2020 Elsevier Patient Education  2024 [employer]  

## 2023-06-13 LAB — LIPID PANEL
Cholesterol: 259 mg/dL — ABNORMAL HIGH (ref <200)
HDL: 71 mg/dL (ref >=50)
LDL Cholesterol (Calc): 167 mg/dL — ABNORMAL HIGH
Non-HDL Cholesterol (Calc): 188 mg/dL — ABNORMAL HIGH (ref <130)
Total CHOL/HDL Ratio: 3.6 (calc) (ref <5.0)
Triglycerides: 99 mg/dL (ref <150)

## 2023-07-20 DIAGNOSIS — Z23 Encounter for immunization: Secondary | ICD-10-CM | POA: Diagnosis not present

## 2023-08-28 ENCOUNTER — Ambulatory Visit: Payer: Medicare Other | Admitting: Internal Medicine

## 2023-09-19 ENCOUNTER — Ambulatory Visit (INDEPENDENT_AMBULATORY_CARE_PROVIDER_SITE_OTHER): Payer: Medicare Other | Admitting: Internal Medicine

## 2023-09-19 ENCOUNTER — Encounter: Payer: Self-pay | Admitting: Internal Medicine

## 2023-09-19 VITALS — BP 136/74 | HR 75 | Temp 97.9°F | Resp 16 | Ht 70.0 in | Wt 183.8 lb

## 2023-09-19 DIAGNOSIS — Z79899 Other long term (current) drug therapy: Secondary | ICD-10-CM | POA: Diagnosis not present

## 2023-09-19 DIAGNOSIS — E782 Mixed hyperlipidemia: Secondary | ICD-10-CM

## 2023-09-19 DIAGNOSIS — E538 Deficiency of other specified B group vitamins: Secondary | ICD-10-CM

## 2023-09-19 DIAGNOSIS — I700 Atherosclerosis of aorta: Secondary | ICD-10-CM | POA: Diagnosis not present

## 2023-09-19 DIAGNOSIS — I10 Essential (primary) hypertension: Secondary | ICD-10-CM

## 2023-09-19 DIAGNOSIS — R7309 Other abnormal glucose: Secondary | ICD-10-CM

## 2023-09-19 DIAGNOSIS — E559 Vitamin D deficiency, unspecified: Secondary | ICD-10-CM

## 2023-09-19 NOTE — Progress Notes (Signed)
 [facility]      ADULT   &   ADOLESCENT      INTERNAL MEDICINE  Elsie Richards, M.D.          Lonell Rous, ANP        Tonya Cranford, FNP  [facility] [address]  [place], [place]. [address] Telephone [phone] Telefax [phone]   Future Appointments  Date Time Provider Department  09/19/2023                       6 mo ov  2:30 PM Richards Elsie, MD [facility]  03/11/2024                       cpe  3:00 PM Richards Elsie, MD [facility]    History of Present Illness:       This very nice 73 y.o.  WWF  with HTN, HLD, COPD, Pre-Diabetes and Vitamin D  Deficiency  presents for 6  month follow up . Abd CT scan  in 2019 did show Aortic Atherosclerosis. Patient has hx/o a low Vit B12 level of 226 in the past.  Patient has COPD consequent of a smoking hx/o 1 ppd x 40 + years.         Patient is treated for HTN  (2005)  & BP has been controlled at home. Today's BP ias at goal - 136/74. Patient has had no complaints of any cardiac type chest pain, palpitations, dyspnea chet /PND, dizziness, claudication  or dependent edema.        Hyperlipidemia is controlled with diet & meds. Patient denies myalgias or other med SE's. Last Lipids were not at goal :  Lab Results  Component Value Date   CHOL 259 (H) 06/12/2023   HDL 71 06/12/2023   LDLCALC 167 (H) 06/12/2023   TRIG 99 06/12/2023   CHOLHDL 3.6 06/12/2023     Also, the patient has and has been monitored expectantly for glucose intolerance and in 2017 had an elevated insulin  level of 27+. Patient  had no symptoms of reactive hypoglycemia, diabetic polys, paresthesias or visual blurring.  Last A1c was normal & at goal :  Lab Results  Component Value Date   HGBA1C 5.0 02/16/2023         Further, the patient also has history of Vitamin D  Deficiency  (10 /2008) and supplements vitamin D  . Last vitamin D  was still Low :  Lab Results  Component Value Date   VD25OH 45 02/16/2023       Current Outpatient Medications on File Prior to Visit  Medication Sig   ALPRAZolam  (XANAX ) 0.25 MG tablet Take 0.25 mg by mouth daily as needed for anxiety. (Patient not taking: Reported on 06/12/2023)   rosuvastatin  (CRESTOR ) 10 MG tablet Take 1 tablet Daily for Cholesterol (Patient not taking: Reported on 06/12/2023)     Allergies  Allergen Reactions   Lipitor [Atorvastatin] Other (See Comments)    Fatigue.    PMHx:   Past Medical History:  Diagnosis Date   Anxiety    B12 deficiency    FHx: heart disease 10/07/2018   High cholesterol    Hypertension    IBS (irritable bowel syndrome)    Vitamin D  deficiency      Immunization History  Administered Date(s) Administered   Fluad Quad(high Dose) 07/10/2020   Influenza, High Dose  06/29/2018, 06/28/2019, 07/05/2021   Influenza-Unspecified 07/04/2022   PFIZER-SARS-COV-2 Vacc 04/16/2020, 05/07/2020, 06/11/2021  PPD Test 03/19/2014, 03/24/2015, 06/01/2016   Pneumococcal - 13 07/24/2017   Pneumococcal - 23 06/01/2016   Tdap 01/24/2013   Zoster, Live 11/03/2013     Past Surgical History:  Procedure Laterality Date   AUGMENTATION MAMMAPLASTY     CATARACT EXTRACTION, BILATERAL Bilateral 2019   Dr. Milan    COLONOSCOPY  03/30/2010   Dr.Stark   TUBAL LIGATION       FHx:    Reviewed / unchanged   SHx:    Reviewed / unchanged    Systems Review:  Constitutional: Denies fever, chills, wt changes, headaches, insomnia, fatigue, night sweats, change in appetite. Eyes: Denies redness, blurred vision, diplopia, discharge, itchy, watery eyes.  ENT: Denies discharge, congestion, post nasal drip, epistaxis, sore throat, earache, hearing loss, dental pain, tinnitus, vertigo, sinus pain, snoring.  CV: Denies chest pain, palpitations, irregular heartbeat, syncope, dyspnea, diaphoresis, orthopnea, PND, claudication or edema. Respiratory: denies cough, dyspnea, DOE, pleurisy, hoarseness, laryngitis, wheezing.   Gastrointestinal: Denies dysphagia, odynophagia, heartburn, reflux, water brash, abdominal pain or cramps, nausea, vomiting, bloating, diarrhea, constipation, hematemesis, melena, hematochezia  or hemorrhoids. Genitourinary: Denies dysuria, frequency, urgency, nocturia, hesitancy, discharge, hematuria or flank pain. Musculoskeletal: Denies arthralgias, myalgias, stiffness, jt. swelling, pain, limping or strain/sprain.  Skin: Denies pruritus, rash, hives, warts, acne, eczema or change in skin lesion(s). Neuro: No weakness, tremor, incoordination, spasms, paresthesia or pain. Psychiatric: Denies confusion, memory loss or sensory loss. Endo: Denies change in weight, skin or hair change.  Heme/Lymph: No excessive bleeding, bruising or enlarged lymph nodes.   Physical Exam  BP 136/74   Pulse 75   Temp 97.9 F (36.6 C)   Resp 16   Ht 5' 10 (1.778 m)   Wt 183 lb 12.8 oz (83.4 kg)   SpO2 97%   BMI 26.37 kg/m   Appears  well nourished, well groomed  and in no distress.  Eyes: PERRLA, EOMs, conjunctiva no swelling or erythema. Sinuses: No frontal/maxillary tenderness ENT/Mouth: EAC's clear, TM's nl w/o erythema, bulging. Nares clear w/o erythema, swelling, exudates. Oropharynx clear without erythema or exudates. Oral hygiene is good. Tongue normal, non obstructing. Hearing intact.  Neck: Supple. Thyroid  not palpable. Car 2+/2+ without bruits, nodes or JVD. Chest: Respirations nl with BS clear & equal w/o rales, rhonchi, wheezing or stridor.  Cor: Heart sounds normal w/ regular rate and rhythm without sig. murmurs, gallops, clicks or rubs. Peripheral pulses normal and equal  without edema.  Abdomen: Soft & bowel sounds normal. Non-tender w/o guarding, rebound, hernias, masses or organomegaly.  Lymphatics: Unremarkable.  Musculoskeletal: Full ROM all peripheral extremities, joint stability, 5/5 strength and normal gait.  Skin: Warm, dry without exposed rashes, lesions or ecchymosis apparent.   Neuro: Cranial nerves intact, reflexes equal bilaterally. Sensory-motor testing grossly intact. Tendon reflexes grossly intact.  Pysch: Alert & oriented x 3.  Insight and judgement nl & appropriate. No ideations.   Assessment and Plan:   1. Essential hypertension (Primary)  - Continue medication, monitor blood pressure at home.  - Continue DASH diet.  Reminder to go to the ER if any CP,  SOB, nausea, dizziness, severe HA, changes vision/speech.   - CBC with Differential/Platelet - COMPLETE METABOLIC PANEL WITH GFR - Magnesium - TSH   2. Hyperlipidemia, mixed  - Continue diet/meds, exercise,& lifestyle modifications.  - Continue monitor periodic cholesterol/liver & renal functions    - Lipid panel - TSH   3. Abnormal glucose  - Continue diet, exercise  - Lifestyle modifications.  - Monitor appropriate  labs   - Hemoglobin A1c - Insulin , random   4. Vitamin D    - Continue supplementation  - VITAMIN D  25 Hydroxy   5. Aortic atherosclerosis (HCC) bny Abd CT scan on 10/30/2017  - Lipid panel  6. Vitamin B12 deficiency  - Vitamin B12   7. Medication management  - CBC with Differential/Platelet - COMPLETE METABOLIC PANEL WITH GFR - Magnesium - Lipid panel - TSH - Hemoglobin A1c - Insulin , random - VITAMIN D  25 Hydroxy - Vitamin B12=         Discussed  regular exercise, BP monitoring, weight control to achieve/maintain BMI less than 25 and discussed med and SE's. Recommended labs to assess /monitor clinical status .  I discussed the assessment and treatment plan with the patient. The patient was provided an opportunity to ask questions and all were answered. The patient agreed with the plan and demonstrated an understanding of the instructions.  I provided over 30 minutes of exam, counseling, chart review and  complex critical decision making.         Elsie JONETTA Richards, MD

## 2023-09-19 NOTE — Patient Instructions (Addendum)
 /Due to recent changes in healthcare laws, you may see the results of your imaging and laboratory studies on MyChart before your provider has had a chance to review them.  We understand that in some cases there may be results that are confusing or concerning to you. Not all laboratory results come back in the same time frame and the provider may be waiting for multiple results in order to interpret others.  Please give us  48 hours in order for your provider to thoroughly review all the results before contacting the office for clarification of your results.    Lab Results  Component Value Date   CHOL 259 (H) 06/12/2023    HDL 71 06/12/2023   LDLCALC 167 (H) 06/12/2023   TRIG 99 06/12/2023   CHOLHDL 3.6 06/12/2023   +++++++++++++++++++++++++  Vit D  & Vit C 1,000 mg   are recommended to help protect  against the Covid-19 and other Corona viruses.    Also it's recommended  to take  Zinc 50 mg  to help  protect against the Covid-19   and best place to get  is also on [email or web address]  and don't pay more than 6-8 cents /pill !  ================================ Coronavirus (COVID-19) Are you at risk?  Are you at risk for the Coronavirus (COVID-19)?  To be considered HIGH RISK for Coronavirus (COVID-19), you have to meet the following criteria:  Traveled to [place], [place], [place], [place] or [place]; or in the [place]  to [place], [place], [place]  or [place] ; and have fever, cough, and shortness of breath within the last 2 weeks of travel OR Been in close contact with a person diagnosed with COVID-19 within the last 2 weeks and have  fever, cough,and shortness of breath  IF YOU DO NOT MEET THESE CRITERIA, YOU ARE CONSIDERED LOW RISK FOR COVID-19.  What to do if you are HIGH RISK for COVID-19?  If you are having a medical emergency, call 911. Seek medical care right away. Before you go to a doctor's office, urgent care or emergency department,  call ahead and tell them  about your recent travel, contact with someone diagnosed with COVID-19   and your symptoms.  You should receive instructions from your physician's office regarding next steps of care.  When you arrive at healthcare provider, tell the healthcare staff immediately you have returned from  visiting [place], [place], [place], [place] or [place]; or traveled in the [place]  to [place], [place],  [place] or [place]  in the last two weeks or you have been in close contact with a person diagnosed with  COVID-19 in the last 2 weeks.   Tell the health care staff about your symptoms: fever, cough and shortness of breath. After you have been seen by a medical provider, you will be either: Tested for (COVID-19) and discharged home on quarantine except to seek medical care if  symptoms worsen, and asked to  Stay home and avoid contact with others until you get your results (4-5 days)  Avoid travel on public transportation if possible (such as bus, train, or airplane) or Sent to the Emergency Department by EMS for evaluation, COVID-19 testing  and  possible admission depending on your condition and test results.  What to do if you are LOW RISK for COVID-19?  Reduce your risk of any infection by using the same precautions used for avoiding the common cold or flu:  Wash your hands often with soap and warm water  for at least 20 seconds.  If soap and water are not readily available,  use an alcohol -based hand sanitizer with at least 60% alcohol .  If coughing or sneezing, cover your mouth and nose by coughing or sneezing into the elbow areas of your shirt or coat,  into a tissue or into your sleeve (not your hands). Avoid shaking hands with others and consider head nods or verbal greetings only. Avoid touching your eyes, nose, or mouth with unwashed hands.  Avoid close contact with people who are sick. Avoid places or events with large numbers of people in one location, like concerts or sporting  events. Carefully consider travel plans you have or are making. If you are planning any travel outside or inside the [place] , visit the CDC's Travelers' Health webpage for the latest health notices. If you have some symptoms but not all symptoms, continue to monitor at home and seek medical attention  if your symptoms worsen. If you are having a medical emergency, call 911.   >>>>>>>>>>>>>>>>>>>>>>>>>>>>>>>>>  We Do NOT Approve of [employer] > > > > > > > > > > > > > > > > > > > > > > > > > > > > > > > > > > > > > > >  Preventive Care for Adults  A healthy lifestyle and preventive care can promote health and wellness. Preventive health guidelines for women include the following key practices. A routine yearly physical is a good way to check with your health care provider about your health and preventive screening. It is a chance to share any concerns and updates on your health and to receive a thorough exam. Visit your dentist for a routine exam and preventive care every 6 months. Brush your teeth twice a day and floss once a day. Good oral hygiene prevents tooth decay and gum disease. The frequency of eye exams is based on your age, health, family medical history, use of contact lenses, and other factors. Follow your health care provider's recommendations for frequency of eye exams. Eat a healthy diet. Foods like vegetables, fruits, whole grains, low-fat dairy products, and lean protein foods contain the nutrients you need without too many calories. Decrease your intake of foods high in solid fats, added sugars, and salt. Eat the right amount of calories for you. Get information about a proper diet from your health care provider, if necessary. Regular physical exercise is one of the most important things you can do for your health. Most adults should get at least 150 minutes of moderate-intensity exercise (any activity that increases your heart rate and causes you to sweat) each week. In  addition, most adults need muscle-strengthening exercises on 2 or more days a week. Maintain a healthy weight. The body mass index (BMI) is a screening tool to identify possible weight problems. It provides an estimate of body fat based on height and weight. Your health care provider can find your BMI and can help you achieve or maintain a healthy weight. For adults 20 years and older: A BMI below 18.5 is considered underweight. A BMI of 18.5 to 24.9 is normal. A BMI of 25 to 29.9 is considered overweight. A BMI of 30 and above is considered obese. Maintain normal blood lipids and cholesterol levels by exercising and minimizing your intake of saturated fat. Eat a balanced diet with plenty of fruit and vegetables. If your lipid or cholesterol levels are high, you are over 50, or you are at high  risk for heart disease, you may need your cholesterol levels checked more frequently. Ongoing high lipid and cholesterol levels should be treated with medicines if diet and exercise are not working. If you smoke, find out from your health care provider how to quit. If you do not use tobacco, do not start. Lung cancer screening is recommended for adults aged 55-80 years who are at high risk for developing lung cancer because of a history of smoking. A yearly low-dose CT scan of the lungs is recommended for people who have at least a 30-pack-year history of smoking and are a current smoker or have quit within the past 15 years. A pack year of smoking is smoking an average of 1 pack of cigarettes a day for 1 year (for example: 1 pack a day for 30 years or 2 packs a day for 15 years). Yearly screening should continue until the smoker has stopped smoking for at least 15 years. Yearly screening should be stopped for people who develop a health problem that would prevent them from having lung cancer treatment. Avoid use of street drugs. Do not share needles with anyone. Ask for help if you need support or instructions about  stopping the use of drugs. High blood pressure causes heart disease and increases the risk of stroke.  Ongoing high blood pressure should be treated with medicines if weight loss and exercise do not work. If you are 69-64 years old, ask your health care provider if you should take aspirin  to prevent strokes. Diabetes screening involves taking a blood sample to check your fasting blood sugar level. This should be done once every 3 years, after age 78, if you are within normal weight and without risk factors for diabetes. Testing should be considered at a younger age or be carried out more frequently if you are overweight and have at least 1 risk factor for diabetes. Breast cancer screening is essential preventive care for women. You should practice breast self-awareness. This means understanding the normal appearance and feel of your breasts and may include breast self-examination. Any changes detected, no matter how small, should be reported to a health care provider. Women in their 27s and 30s should have a clinical breast exam (CBE) by a health care provider as part of a regular health exam every 1 to 3 years. After age 41, women should have a CBE every year. Starting at age 51, women should consider having a mammogram (breast X-ray test) every year. Women who have a family history of breast cancer should talk to their health care provider about genetic screening. Women at a high risk of breast cancer should talk to their health care providers about having an MRI and a mammogram every year. Breast cancer gene (BRCA)-related cancer risk assessment is recommended for women who have family members with BRCA-related cancers. BRCA-related cancers include breast, ovarian, tubal, and peritoneal cancers. Having family members with these cancers may be associated with an increased risk for harmful changes (mutations) in the breast cancer genes BRCA1 and BRCA2. Results of the assessment will determine the need for  genetic counseling and BRCA1 and BRCA2 testing. Routine pelvic exams to screen for cancer are no longer recommended for nonpregnant women who are considered low risk for cancer of the pelvic organs (ovaries, uterus, and vagina) and who do not have symptoms. Ask your health care provider if a screening pelvic exam is right for you. If you have had past treatment for cervical cancer or a condition that could lead  to cancer, you need Pap tests and screening for cancer for at least 20 years after your treatment. If Pap tests have been discontinued, your risk factors (such as having a new sexual partner) need to be reassessed to determine if screening should be resumed. Some women have medical problems that increase the chance of getting cervical cancer. In these cases, your health care provider may recommend more frequent screening and Pap tests.  Colorectal cancer can be detected and often prevented. Most routine colorectal cancer screening begins at the age of 79 years and continues through age 36 years. However, your health care provider may recommend screening at an earlier age if you have risk factors for colon cancer. On a yearly basis, your health care provider may provide home test kits to check for hidden blood in the stool. Use of a small camera at the end of a tube, to directly examine the colon (sigmoidoscopy or colonoscopy), can detect the earliest forms of colorectal cancer. Talk to your health care provider about this at age 68, when routine screening begins.  Direct exam of the colon should be repeated every 5-10 years through age 63 years, unless early forms of pre-cancerous polyps or small growths are found. Osteoporosis is a disease in which the bones lose minerals and strength with aging. This can result in serious bone fractures or breaks. The risk of osteoporosis can be identified using a bone density scan. Women ages 69 years and over and women at risk for fractures or osteoporosis should  discuss screening with their health care providers. Ask your health care provider whether you should take a calcium  supplement or vitamin D  to reduce the rate of osteoporosis. Menopause can be associated with physical symptoms and risks. Hormone replacement therapy is available to decrease symptoms and risks. You should talk to your health care provider about whether hormone replacement therapy is right for you. Use sunscreen. Apply sunscreen liberally and repeatedly throughout the day. You should seek shade when your shadow is shorter than you. Protect yourself by wearing long sleeves, pants, a wide-brimmed hat, and sunglasses year round, whenever you are outdoors. Once a month, do a whole body skin exam, using a mirror to look at the skin on your back. Tell your health care provider of new moles, moles that have irregular borders, moles that are larger than a pencil eraser, or moles that have changed in shape or color. Stay current with required vaccines (immunizations). Influenza vaccine. All adults should be immunized every year. Tetanus, diphtheria, and acellular pertussis (Td, Tdap) vaccine. Pregnant women should receive 1 dose of Tdap vaccine during each pregnancy. The dose should be obtained regardless of the length of time since the last dose. Immunization is preferred during the 27th-36th week of gestation. An adult who has not previously received Tdap or who does not know her vaccine status should receive 1 dose of Tdap. This initial dose should be followed by tetanus and diphtheria toxoids (Td) booster doses every 10 years. Adults with an unknown or incomplete history of completing a 3-dose immunization series with Td-containing vaccines should begin or complete a primary immunization series including a Tdap dose. Adults should receive a Td booster every 10 years.  Zoster vaccine. One dose is recommended for adults aged 20 years or older unless certain conditions are present.  Pneumococcal  13-valent conjugate (PCV13) vaccine. When indicated, a person who is uncertain of her immunization history and has no record of immunization should receive the PCV13 vaccine. An adult aged  19 years or older who has certain medical conditions and has not been previously immunized should receive 1 dose of PCV13 vaccine. This PCV13 should be followed with a dose of pneumococcal polysaccharide (PPSV23) vaccine. The PPSV23 vaccine dose should be obtained at least 1 or more year(s) after the dose of PCV13 vaccine. An adult aged 38 years or older who has certain medical conditions and previously received 1 or more doses of PPSV23 vaccine should receive 1 dose of PCV13. The PCV13 vaccine dose should be obtained 1 or more years after the last PPSV23 vaccine dose.  Pneumococcal polysaccharide (PPSV23) vaccine. When PCV13 is also indicated, PCV13 should be obtained first. All adults aged 31 years and older should be immunized. An adult younger than age 54 years who has certain medical conditions should be immunized. Any person who resides in a nursing home or long-term care facility should be immunized. An adult smoker should be immunized. People with an immunocompromised condition and certain other conditions should receive both PCV13 and PPSV23 vaccines. People with human immunodeficiency virus (HIV) infection should be immunized as soon as possible after diagnosis. Immunization during chemotherapy or radiation therapy should be avoided. Routine use of PPSV23 vaccine is not recommended for American Indians, [place]  Natives, or people younger than 65 years unless there are medical conditions that require PPSV23 vaccine. When indicated, people who have unknown immunization and have no record of immunization should receive PPSV23 vaccine. One-time revaccination 5 years after the first dose of PPSV23 is recommended for people aged 19-64 years who have chronic kidney failure, nephrotic syndrome, asplenia, or immunocompromised  conditions. People who received 1-2 doses of PPSV23 before age 69 years should receive another dose of PPSV23 vaccine at age 33 years or later if at least 5 years have passed since the previous dose. Doses of PPSV23 are not needed for people immunized with PPSV23 at or after age 15 years.  Preventive Services / Frequency  Ages 65 years and over Blood pressure check. Lipid and cholesterol check. Lung cancer screening. / Every year if you are aged 55-80 years and have a 30-pack-year history of smoking and currently smoke or have quit within the past 15 years. Yearly screening is stopped once you have quit smoking for at least 15 years or develop a health problem that would prevent you from having lung cancer treatment. Clinical breast exam.** / Every year after age 62 years.  BRCA-related cancer risk assessment.** / For women who have family members with a BRCA-related cancer (breast, ovarian, tubal, or peritoneal cancers). Mammogram.** / Every year beginning at age 55 years and continuing for as long as you are in good health. Consult with your health care provider. Pap test.** / Every 3 years starting at age 73 years through age 21 or 18 years with 3 consecutive normal Pap tests. Testing can be stopped between 65 and 70 years with 3 consecutive normal Pap tests and no abnormal Pap or HPV tests in the past 10 years. Fecal occult blood test (FOBT) of stool. / Every year beginning at age 65 years and continuing until age 23 years. You may not need to do this test if you get a colonoscopy every 10 years. Flexible sigmoidoscopy or colonoscopy.** / Every 5 years for a flexible sigmoidoscopy or every 10 years for a colonoscopy beginning at age 55 years and continuing until age 50 years. Hepatitis C blood test.** / For all people born from [birth year] through 1965 and any individual with known risks for hepatitis C.  Osteoporosis screening.** / A one-time screening for women ages 77 years and over and women at risk  for fractures or osteoporosis. Skin self-exam. / Monthly. Influenza vaccine. / Every year. Tetanus, diphtheria, and acellular pertussis (Tdap/Td) vaccine.** / 1 dose of Td every 10 years. Zoster vaccine.** / 1 dose for adults aged 2 years or older. Pneumococcal 13-valent conjugate (PCV13) vaccine.** / Consult your health care provider. Pneumococcal polysaccharide (PPSV23) vaccine.** / 1 dose for all adults aged 98 years and older. Screening for abdominal aortic aneurysm (AAA)  by ultrasound is recommended for people who have history of high blood pressure or who are current or former smokers. ++++++++++++++++++++ Recommend Adult Low Dose Aspirin  or  coated  Aspirin  81 mg daily  To reduce risk of Colon Cancer 40 %,  Skin Cancer 26 % ,  Melanoma 46%  and  Pancreatic cancer 60% ++++++++++++++++++++ Vitamin D  goal  is between 70-100.  Please make sure that you are taking your Vitamin D  as directed.  It is very important as a natural anti-inflammatory  helping hair, skin, and nails, as well as reducing stroke and heart attack risk.  It helps your bones and helps with mood. It also decreases numerous cancer risks so please take it as directed.  Low Vit D is associated with a 200-300% higher risk for CANCER  and 200-300% higher risk for HEART   ATTACK  &  STROKE.   .....................................SABRA It is also associated with higher death rate at younger ages,  autoimmune diseases like Rheumatoid arthritis, Lupus, Multiple Sclerosis.    Also many other serious conditions, like depression, Alzheimer's Dementia, infertility, muscle aches, fatigue, fibromyalgia - just to name a few. ++++++++++++++++++ Recommend the book The END of DIETING by Dr Marty Resides  & the book The END of DIABETES  by Dr Marty Resides At [email or web address] - get book & Audio CD's    Being diabetic has a  300% increased risk for heart attack, stroke, cancer, and alzheimer- type vascular dementia. It is very important  that you work harder with diet by avoiding all foods that are white. Avoid white rice (brown & wild rice is OK), white potatoes (sweetpotatoes in moderation is OK), White bread or wheat bread or anything made out of white flour like bagels, donuts, rolls, buns, biscuits, cakes, pastries, cookies, pizza crust, and pasta (made from white flour & egg whites) - vegetarian pasta or spinach or wheat pasta is OK. Multigrain breads like Arnold's or [employer], or multigrain sandwich thins or flatbreads.  Diet, exercise and weight loss can reverse and cure diabetes in the early stages.  Diet, exercise and weight loss is very important in the control and prevention of complications of diabetes which affects every system in your body, ie. Brain - dementia/stroke, eyes - glaucoma/blindness, heart - heart attack/heart failure, kidneys - dialysis, stomach - gastric paralysis, intestines - malabsorption, nerves - severe painful neuritis, circulation - gangrene & loss of a leg(s), and finally cancer and Alzheimers.    I recommend avoid fried & greasy foods,  sweets/candy, white rice (brown or wild rice or Quinoa is OK), white potatoes (sweet potatoes are OK) - anything made from white flour - bagels, doughnuts, rolls, buns, biscuits,white and wheat breads, pizza crust and traditional pasta made of white flour & egg white(vegetarian pasta or spinach or wheat pasta is OK).  Multi-grain bread is OK - like multi-grain flat bread or sandwich thins. Avoid alcohol  in excess. Exercise is also important.    Eat all  the vegetables you want - avoid meat, especially red meat and dairy - especially cheese.  Cheese is the most concentrated form of trans-fats which is the worst thing to clog up our arteries. Veggie cheese is OK which can be found in the fresh produce section at [employer] or [employer] or [employer]  +++++++++++++++++++ DASH Eating Plan  DASH stands for Dietary Approaches to Stop Hypertension.   The DASH  eating plan is a healthy eating plan that has been shown to reduce high blood pressure (hypertension). Additional health benefits may include reducing the risk of type 2 diabetes mellitus, heart disease, and stroke. The DASH eating plan may also help with weight loss. WHAT DO I NEED TO KNOW ABOUT THE DASH EATING PLAN? For the DASH eating plan, you will follow these general guidelines: Choose foods with a percent daily value for sodium of less than 5% (as listed on the food label). Use salt-free seasonings or herbs instead of table salt or sea salt. Check with your health care provider or pharmacist before using salt substitutes. Eat lower-sodium products, often labeled as lower sodium or no salt added. Eat fresh foods. Eat more vegetables, fruits, and low-fat dairy products. Choose whole grains. Look for the word whole as the first word in the ingredient list. Choose fish  Limit sweets, desserts, sugars, and sugary drinks. Choose heart-healthy fats. Eat veggie cheese  Eat more home-cooked food and less restaurant, buffet, and fast food. Limit fried foods. Cook foods using methods other than frying. Limit canned vegetables. If you do use them, rinse them well to decrease the sodium. When eating at a restaurant, ask that your food be prepared with less salt, or no salt if possible.                      WHAT FOODS CAN I EAT? Read Dr Marty Fuhrman's books on The End of Dieting & The End of Diabetes  Grains Whole grain or whole wheat bread. Brown rice. Whole grain or whole wheat pasta. Quinoa, bulgur, and whole grain cereals. Low-sodium cereals. Corn or whole wheat flour tortillas. Whole grain cornbread. Whole grain crackers. Low-sodium crackers.  Vegetables Fresh or frozen vegetables (raw, steamed, roasted, or grilled). Low-sodium or reduced-sodium tomato and vegetable juices. Low-sodium or reduced-sodium tomato sauce and paste. Low-sodium or reduced-sodium canned vegetables.    Fruits All fresh, canned (in natural juice), or frozen fruits.  Protein Products  All fish and seafood.  Dried beans, peas, or lentils. Unsalted nuts and seeds. Unsalted canned beans.  Dairy Low-fat dairy products, such as skim or 1% milk, 2% or reduced-fat cheeses, low-fat ricotta or cottage cheese, or plain low-fat yogurt. Low-sodium or reduced-sodium cheeses.  Fats and Oils Tub margarines without trans fats. Light or reduced-fat mayonnaise and salad dressings (reduced sodium). Avocado. Safflower, olive, or canola oils. Natural peanut or almond butter.  Other Unsalted popcorn and pretzels. The items listed above may not be a complete list of recommended foods or beverages. Contact your dietitian for more options.  +++++++++++++++  WHAT FOODS ARE NOT RECOMMENDED? Grains/ White flour or wheat flour White bread. White pasta. White rice. Refined cornbread. Bagels and croissants. Crackers that contain trans fat.  Vegetables  Creamed or fried vegetables. Vegetables in a . Regular canned vegetables. Regular canned tomato sauce and paste. Regular tomato and vegetable juices.  Fruits Dried fruits. Canned fruit in light or heavy syrup. Fruit juice.  Meat and Other Protein Products Meat in general - RED meat &  White meat.  Fatty cuts of meat. Ribs, chicken wings, all processed meats as bacon, sausage, bologna, salami, fatback, hot dogs, bratwurst and packaged luncheon meats.  Dairy Whole or 2% milk, cream, half-and-half, and cream cheese. Whole-fat or sweetened yogurt. Full-fat cheeses or blue cheese. Non-dairy creamers and whipped toppings. Processed cheese, cheese spreads, or cheese curds.  Condiments Onion and garlic salt, seasoned salt, table salt, and sea salt. Canned and packaged gravies. Worcestershire sauce. Tartar sauce. Barbecue sauce. Teriyaki sauce. Soy sauce, including reduced sodium. Steak sauce. Fish sauce. Oyster sauce. Cocktail sauce. Horseradish. Ketchup and mustard.  Meat flavorings and tenderizers. Bouillon cubes. Hot sauce. Tabasco sauce. Marinades. Taco seasonings. Relishes.  Fats and Oils Butter, stick margarine, lard, shortening and bacon fat. Coconut, palm kernel, or palm oils. Regular salad dressings.  Pickles and olives. Salted popcorn and pretzels.  The items listed above may not be a complete list of foods and beverages to avoid.

## 2023-09-20 ENCOUNTER — Encounter: Payer: Self-pay | Admitting: Internal Medicine

## 2023-09-20 LAB — LIPID PANEL
Cholesterol: 240 mg/dL — ABNORMAL HIGH (ref <200)
HDL: 60 mg/dL (ref >=50)
LDL Cholesterol (Calc): 149 mg/dL — ABNORMAL HIGH
Non-HDL Cholesterol (Calc): 180 mg/dL — ABNORMAL HIGH (ref <130)
Total CHOL/HDL Ratio: 4 (calc) (ref <5.0)
Triglycerides: 176 mg/dL — ABNORMAL HIGH (ref <150)

## 2023-09-20 LAB — COMPLETE METABOLIC PANEL WITH GFR
AG Ratio: 1.6 (calc) (ref 1.0–2.5)
ALT: 10 U/L (ref 6–29)
AST: 13 U/L (ref 10–35)
Albumin: 3.8 g/dL (ref 3.6–5.1)
Alkaline phosphatase (APISO): 54 U/L (ref 37–153)
BUN/Creatinine Ratio: 11 (calc) (ref 6–22)
BUN: 15 mg/dL (ref 7–25)
CO2: 29 mmol/L (ref 20–32)
Calcium: 9.1 mg/dL (ref 8.6–10.4)
Chloride: 106 mmol/L (ref 98–110)
Creat: 1.34 mg/dL — ABNORMAL HIGH (ref 0.60–1.00)
Globulin: 2.4 g/dL (ref 1.9–3.7)
Glucose, Bld: 86 mg/dL (ref 65–99)
Potassium: 4.5 mmol/L (ref 3.5–5.3)
Sodium: 141 mmol/L (ref 135–146)
Total Bilirubin: 0.4 mg/dL (ref 0.2–1.2)
Total Protein: 6.2 g/dL (ref 6.1–8.1)
eGFR: 42 mL/min/{1.73_m2} — ABNORMAL LOW (ref >=60)

## 2023-09-20 LAB — CBC WITH DIFFERENTIAL/PLATELET
Absolute Lymphocytes: 1284 {cells}/uL (ref 850–3900)
Absolute Monocytes: 377 {cells}/uL (ref 200–950)
Basophils Absolute: 19 {cells}/uL (ref 0–200)
Basophils Relative: 0.5 %
Eosinophils Absolute: 89 {cells}/uL (ref 15–500)
Eosinophils Relative: 2.4 %
HCT: 37.8 % (ref 35.0–45.0)
Hemoglobin: 13 g/dL (ref 11.7–15.5)
MCH: 33.4 pg — ABNORMAL HIGH (ref 27.0–33.0)
MCHC: 34.4 g/dL (ref 32.0–36.0)
MCV: 97.2 fL (ref 80.0–100.0)
MPV: 10.4 fL (ref 7.5–12.5)
Monocytes Relative: 10.2 %
Neutro Abs: 1931 {cells}/uL (ref 1500–7800)
Neutrophils Relative %: 52.2 %
Platelets: 214 10*3/uL (ref 140–400)
RBC: 3.89 10*6/uL (ref 3.80–5.10)
RDW: 12.2 % (ref 11.0–15.0)
Total Lymphocyte: 34.7 %
WBC: 3.7 10*3/uL — ABNORMAL LOW (ref 3.8–10.8)

## 2023-09-20 LAB — HEMOGLOBIN A1C
Hgb A1c MFr Bld: 5 %{Hb} (ref <5.7)
Mean Plasma Glucose: 97 mg/dL
eAG (mmol/L): 5.4 mmol/L

## 2023-09-20 LAB — VITAMIN D 25 HYDROXY (VIT D DEFICIENCY, FRACTURES): Vit D, 25-Hydroxy: 32 ng/mL (ref 30–100)

## 2023-09-20 LAB — MAGNESIUM: Magnesium: 2.2 mg/dL (ref 1.5–2.5)

## 2023-09-20 LAB — TSH: TSH: 0.79 m[IU]/L (ref 0.40–4.50)

## 2023-09-20 LAB — VITAMIN B12: Vitamin B-12: 304 pg/mL (ref 200–1100)

## 2023-09-20 LAB — INSULIN, RANDOM: Insulin: 40.6 u[IU]/mL — ABNORMAL HIGH

## 2023-09-21 ENCOUNTER — Other Ambulatory Visit: Payer: Self-pay | Admitting: Internal Medicine

## 2023-09-21 DIAGNOSIS — E782 Mixed hyperlipidemia: Secondary | ICD-10-CM

## 2023-09-21 DIAGNOSIS — I700 Atherosclerosis of aorta: Secondary | ICD-10-CM

## 2023-09-21 DIAGNOSIS — N179 Acute kidney failure, unspecified: Secondary | ICD-10-CM

## 2023-09-21 MED ORDER — ROSUVASTATIN CALCIUM 20 MG PO TABS: ORAL_TABLET | ORAL | 3 refills | Status: AC

## 2023-09-21 NOTE — Progress Notes (Signed)
- Test results slightly outside the reference range are not unusual. If there is anything important, I will review this with you,  otherwise it is considered normal test values.  If you have further questions,  please do not hesitate to contact me at the office or via My Chart.   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   - Suggest call office                     to schedule a lab visit in 1 month                                                                to recheck Kidney functions so you don't end up on Dialysis !  =========================================================================   - Total Chol 240      is very high risk for Heart Attack /Stroke /Vascular Dementia     ( Ideal or Goal is less than 180 ! )  & - Bad /Dangerous LDL Chol =  149    - - >> Sitting on a time Bomb !     ( Ideal or Goal is less than 70 ! )    - The cause is Bad Diet !   - VERY IMPORTANT that you restart your  medicines   to try &                                                        reverse some of the Damage already done   - Read or listen to   Dr Gerri Spore 's book    " How Not to Die ! "    - Recommend a stricter plant based low cholesterol diet   - Cholesterol only comes from animal sources                                                                  - ie. meat, dairy, egg yolks  - Eat all the vegetables you want.  - Avoid Meat, Avoid Meat , Avoid  Meat  ! ! !                                                   -  especially red meat - Beef AND Pork  - Avoid cheese & dairy - milk & ice cream.   - Cheese is the most concentrated form of trans-fats which                                                     is the worst thing to clog up our arteries.   - Veggie cheese is OK which can be found in                                              the fresh produce section at                                                          [employer] or [employer] or [employer]  =========================================================================  - Also Insulin = 40.6 is elevated  ( Normal is less than 18  ! )  shows   insulin resistance - which is a sign of early diabetes and                                                                                  associated with a 300 % greater risk for                                                        heart attacks, strokes, cancer & Alzheimer type vascular dementia   - All this can be cured  and prevented with losing weight   - get Dr Francis Dowse Fuhrman's book 'the End of Diabetes" and  "the End of Dieting "  =========================================================================  - Fortunately A1c = 5.0% is Normal ( So far )   =========================================================================  - Vitamin D = 32 - Is extremely & Dangerously LOW !                                  - Vitamin D goal is between 70-100.                        - Please START taking  Vitamin D 10,000 units  / day     - It is very important as a natural anti-inflammatory and helping the                          immune system protect against viral infections, like Flu  & the Covid    - Also  helps hair, skin, and nails, as well as reducing stroke and heart attack risk.   - It helps your bones  &  and helps with mood.  - It also decreases numerous cancer risks, so please                                                                                            take it as directed.   - Low Vit D is associated with a 200-300% higher risk for CANCER   and 200-300% higher risk for HEART   ATTACK  &  STROKE.    - It is also associated with higher death rate at younger ages,   autoimmune diseases like Rheumatoid arthritis, Lupus, Multiple Sclerosis.     - Also many other serious conditions, like depression, Alzheimer's Dementia                                                                             muscle aches, fatigue, fibromyalgia   =========================================================================   -  Vitamin B12 304  is Also VERY VERY    LOW   (Ideal or Goal Vit B12 is between 450 - 1,100)   Low Vit B12 may be associated with Anemia , Fatigue,   Peripheral Neuropathy, Dementia, "Brain Fog", & Depression  - Recommend take a sub-lingual form of Vitamin B12 tablet   1,000 to 5,000 mcg tab that you dissolve under your tongue /Daily   - Can get Lavonia Dana - best price at [employer] or on [employer]  =========================================================================  - All Else - CBC - Electrolytes - Liver - Magnesium & Thyroid    - all  Normal / OK  =========================================================================

## 2023-10-17 ENCOUNTER — Telehealth: Payer: Self-pay | Admitting: *Deleted

## 2023-10-17 NOTE — Telephone Encounter (Unsigned)
Copied from CRM [id number]. Topic: Appointments - Scheduling Inquiry for Clinic >> Oct 17, 2023 11:22 AM Lorin Glass B wrote: Reason for CRM: Patient called in as previous patient of the late Dr. Oneta Rack. Wanted to establish care as New patient at [facility]. I worked with Cala Bradford on the CAL to sort out scheduling protocol and patients needs, as patient also needs medication refills. Cala Bradford advised an acute visit due to the special circumstances, but scheduling is not allowing an acute visit in the system. Patients callback [phone]

## 2023-10-17 NOTE — Telephone Encounter (Unsigned)
Copied from CRM [id number]. Topic: Appointments - Scheduling Inquiry for Clinic >> Oct 17, 2023 11:22 AM Brittany Hanna wrote: Reason for CRM: Patient called in as previous patient of the late Dr. Oneta Rack. Wanted to establish care as New patient at [facility]. I worked with Cala Bradford on the CAL to sort out scheduling protocol and patients needs, as patient also needs medication refills. Cala Bradford advised an acute visit due to the special circumstances, but scheduling is not allowing an acute visit in the system. Patients callback [phone]

## 2023-10-19 ENCOUNTER — Ambulatory Visit (INDEPENDENT_AMBULATORY_CARE_PROVIDER_SITE_OTHER): Payer: Medicare Other | Admitting: Nurse Practitioner

## 2023-10-19 ENCOUNTER — Encounter: Payer: Self-pay | Admitting: Nurse Practitioner

## 2023-10-19 VITALS — BP 136/68 | HR 67 | Ht 70.0 in | Wt 174.8 lb

## 2023-10-19 DIAGNOSIS — F419 Anxiety disorder, unspecified: Secondary | ICD-10-CM

## 2023-10-19 MED ORDER — SERTRALINE HCL 100 MG PO TABS
50.0000 mg | ORAL_TABLET | Freq: Every day | ORAL | 1 refills | Status: AC
Start: 2023-10-19 — End: ?

## 2023-10-19 NOTE — Progress Notes (Signed)
 Established Patient Office Visit  Subjective:  Patient ID: Brittany Hanna, female    DOB: December 26, 1950  Age: 73 y.o. MRN: [id number]  CC:  Chief Complaint  Patient presents with   Acute Visit    Needs medication refill   Discussed the use of a AI scribe software for clinical note transcription with the patient, who gave verbal consent to proceed.   HPI  Brittany Hanna presents for acute visit for a medication refill of Zoloft.  She has been taking Zoloft intermittently since the death of her husband, which was followed by anxiety attacks that led to an ER visit. Initially, she was prescribed Zoloft and Xanax, with Xanax taken as needed. She recently restarted Zoloft a couple of weeks ago after stopping all medications for a period.  She has experienced significant personal losses, including the death of her son from COVID-19 two years ago, which has deeply affected her emotional well-being. She feels isolated and struggles with the loss of close family members and friends.  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   HPI   Past Medical History:  Diagnosis Date   Anxiety    B12 deficiency    FHx: heart disease 10/07/2018   High cholesterol    Hypertension    IBS (irritable bowel syndrome)    Vitamin D deficiency     Past Surgical History:  Procedure Laterality Date   AUGMENTATION MAMMAPLASTY     CATARACT EXTRACTION, BILATERAL Bilateral 2019   Dr. Darel Hong    COLONOSCOPY  03/30/2010   Dr.Stark   TUBAL LIGATION      Family History  Problem Relation Age of Onset   Heart failure Mother    Osteoarthritis Father    Hypertension Father    COPD Father    Breast cancer Maternal Aunt    Colon cancer Neg Hx    Esophageal cancer Neg Hx    Stomach cancer Neg Hx    Rectal cancer Neg Hx     Social History   Socioeconomic History   Marital status: Widowed    Spouse name: Not on file    Number of children: Not on file   Years of education: Not on file   Highest education level: Bachelor's degree (e.g., BA, AB, BS)  Occupational History   Not on file  Tobacco Use   Smoking status: Former    Current packs/day: 0.75    Average packs/day: 0.8 packs/day for 40.0 years (30.0 ttl pk-yrs)    Types: Cigarettes   Smokeless tobacco: Never  Vaping Use   Vaping status: Never Used  Substance and Sexual Activity   Alcohol use: Yes    Alcohol/week: 3.0 standard drinks of alcohol    Types: 3 Glasses of wine per week    Comment: Occ   Drug use: No   Sexual activity: Not on file  Other Topics Concern   Not on file  Social History Narrative   Not on file   Social Drivers of Health   Financial Resource Strain: Low Risk  (10/18/2023)   Overall Financial Resource Strain (CARDIA)    Difficulty of Paying Living Expenses: Not hard at all  Food Insecurity: No Food Insecurity (10/18/2023)   Hunger Vital Sign    Worried About Running Out of Food in the Last Year: Never true    Ran Out of Food in the Last Year: Never true  Transportation Needs: No Transportation Needs (10/18/2023)   PRAPARE - Administrator, Civil Service (Medical): No    Lack of Transportation (Non-Medical): No  Physical Activity: Sufficiently Active (10/18/2023)   Exercise Vital Sign    Days of Exercise per Week: 3 days    Minutes of Exercise per Session: 50 min  Stress: Stress Concern Present (10/18/2023)   [employer] of Occupational Health - Occupational Stress Questionnaire    Feeling of Stress : To some extent  Social Connections: Socially Isolated (10/18/2023)   Social Connection and Isolation Panel [NHANES]    Frequency of Communication with Friends and Family: More than three times a week    Frequency of Social Gatherings with Friends and Family: Three times a week    Attends Religious Services: Never    Active Member of Clubs or Organizations: No    Attends Banker Meetings:  Not on file    Marital Status: Widowed  Catering manager Violence: Not on file     Outpatient Medications Prior to Visit  Medication Sig Dispense Refill   rosuvastatin (CRESTOR) 20 MG tablet Take 1 tablet Daily for Cholesterol          /       TAKE      BY      MOUTH 90 tablet 3   sertraline (ZOLOFT) 100 MG tablet Take 50 mg by mouth daily.     No facility-administered medications prior to visit.    Allergies  Allergen Reactions   Lipitor [Atorvastatin] Other (See Comments)    Fatigue.    ROS Review of Systems Negative unless indicated in HPI.    Objective:    Physical Exam  BP 136/68   Pulse 67   Ht 5\' 10"  (1.778 m)   Wt 174 lb 12.8 oz (79.3 kg)   SpO2 96%   BMI 25.08 kg/m  Wt Readings from Last 3 Encounters:  10/19/23 174 lb 12.8 oz (79.3 kg)  09/19/23 183 lb 12.8 oz (83.4 kg)  06/12/23 181 lb 6.4 oz (82.3 kg)     Health Maintenance  Topic Date Due   Zoster Vaccines- Shingrix (1 of 2) 07/07/1970   Lung Cancer Screening  08/13/2022   DTaP/Tdap/Td (2 - Td or Tdap) 01/25/2023   Medicare Annual Wellness (AWV)  07/27/2023   COVID-19 Vaccine (4 - 2024-25 season) 11/04/2023 (Originally 05/07/2023)   Pneumonia Vaccine 43+ Years old (3 of 3 - PPSV23 or PCV20) 07/23/2026 (Originally 06/01/2021)   DEXA SCAN  01/08/2024   MAMMOGRAM  10/03/2024   Colonoscopy  02/21/2025   INFLUENZA VACCINE  Completed   Hepatitis C Screening  Completed   HPV VACCINES  Aged Out    There are no preventive care reminders to display for this patient.  Lab Results  Component Value Date   TSH 0.79 09/19/2023   Lab Results  Component Value Date   WBC 3.7 (L) 09/19/2023   HGB 13.0 09/19/2023   HCT 37.8 09/19/2023   MCV 97.2 09/19/2023   PLT 214 09/19/2023   Lab Results  Component Value Date   NA 141 09/19/2023   K 4.5 09/19/2023   CO2 29 09/19/2023   GLUCOSE 86 09/19/2023   BUN 15 09/19/2023   CREATININE 1.34 (H) 09/19/2023   BILITOT 0.4 09/19/2023   ALKPHOS 52 10/30/2017    AST 13 09/19/2023   ALT 10 09/19/2023   PROT 6.2 09/19/2023   ALBUMIN 4.0 10/30/2017   CALCIUM 9.1 09/19/2023  ANIONGAP 7 02/02/2021   EGFR 42 (L) 09/19/2023   Lab Results  Component Value Date   CHOL 240 (H) 09/19/2023   Lab Results  Component Value Date   HDL 60 09/19/2023   Lab Results  Component Value Date   LDLCALC 149 (H) 09/19/2023   Lab Results  Component Value Date   TRIG 176 (H) 09/19/2023   Lab Results  Component Value Date   CHOLHDL 4.0 09/19/2023   Lab Results  Component Value Date   HGBA1C 5.0 09/19/2023      Assessment & Plan:  Anxiety -     Sertraline HCl; Take 0.5 tablets (50 mg total) by mouth daily.  Dispense: 45 tablet; Refill: 1    Follow-up: No follow-ups on file.   Kara Dies, NP

## 2023-10-25 NOTE — Assessment & Plan Note (Signed)
 Patient has been experiencing increased stress and emotional distress due to recent personal losses, including the death of her son. She has been taking Zoloft 50mg  intermittently and has recently restarted it. She also has Xanax PRN, which she uses infrequently. -Continue Zoloft 50mg  daily. -Consider increasing Zoloft dose if symptoms do not improve. -Refer to a therapist for additional support and coping strategies.

## 2023-12-14 ENCOUNTER — Inpatient Hospital Stay
Admission: RE | Admit: 2023-12-14 | Discharge: 2023-12-14 | Disposition: A | Discharge status: Home / Self Care | Source: Ambulatory Visit | Attending: Internal Medicine | Admitting: Internal Medicine

## 2023-12-14 ENCOUNTER — Other Ambulatory Visit: Payer: Self-pay | Admitting: Internal Medicine

## 2023-12-14 DIAGNOSIS — Z1231 Encounter for screening mammogram for malignant neoplasm of breast: Secondary | ICD-10-CM | POA: Diagnosis not present

## 2023-12-14 DIAGNOSIS — Z0000 Encounter for general adult medical examination without abnormal findings: Secondary | ICD-10-CM

## 2023-12-15 DIAGNOSIS — F17200 Nicotine dependence, unspecified, uncomplicated: Secondary | ICD-10-CM | POA: Diagnosis not present

## 2023-12-15 DIAGNOSIS — E7800 Pure hypercholesterolemia, unspecified: Secondary | ICD-10-CM | POA: Diagnosis not present

## 2023-12-15 DIAGNOSIS — M810 Age-related osteoporosis without current pathological fracture: Secondary | ICD-10-CM | POA: Diagnosis not present

## 2023-12-15 DIAGNOSIS — F321 Major depressive disorder, single episode, moderate: Secondary | ICD-10-CM | POA: Diagnosis not present

## 2023-12-19 ENCOUNTER — Ambulatory Visit: Payer: Medicare Other | Admitting: Nurse Practitioner

## 2023-12-29 ENCOUNTER — Ambulatory Visit: Payer: Medicare Other | Admitting: Nurse Practitioner

## 2024-02-28 ENCOUNTER — Encounter: Payer: Medicare Other | Admitting: Internal Medicine

## 2024-03-11 ENCOUNTER — Encounter: Payer: Medicare Other | Admitting: Internal Medicine

## 2024-04-15 ENCOUNTER — Encounter: Payer: Medicare Other | Admitting: Internal Medicine

## 2024-06-03 DIAGNOSIS — E7800 Pure hypercholesterolemia, unspecified: Secondary | ICD-10-CM | POA: Diagnosis not present

## 2024-06-03 DIAGNOSIS — M810 Age-related osteoporosis without current pathological fracture: Secondary | ICD-10-CM | POA: Diagnosis not present

## 2024-06-03 DIAGNOSIS — E559 Vitamin D deficiency, unspecified: Secondary | ICD-10-CM | POA: Diagnosis not present

## 2024-06-03 DIAGNOSIS — Z1212 Encounter for screening for malignant neoplasm of rectum: Secondary | ICD-10-CM | POA: Diagnosis not present

## 2024-06-10 DIAGNOSIS — Z0000 Encounter for general adult medical examination without abnormal findings: Secondary | ICD-10-CM | POA: Diagnosis not present

## 2024-06-10 DIAGNOSIS — R82998 Other abnormal findings in urine: Secondary | ICD-10-CM | POA: Diagnosis not present

## 2024-06-10 DIAGNOSIS — E7800 Pure hypercholesterolemia, unspecified: Secondary | ICD-10-CM | POA: Diagnosis not present

## 2024-06-10 DIAGNOSIS — M810 Age-related osteoporosis without current pathological fracture: Secondary | ICD-10-CM | POA: Diagnosis not present

## 2024-06-10 DIAGNOSIS — F321 Major depressive disorder, single episode, moderate: Secondary | ICD-10-CM | POA: Diagnosis not present

## 2024-06-10 DIAGNOSIS — E559 Vitamin D deficiency, unspecified: Secondary | ICD-10-CM | POA: Diagnosis not present

## 2024-06-10 DIAGNOSIS — Z1339 Encounter for screening examination for other mental health and behavioral disorders: Secondary | ICD-10-CM | POA: Diagnosis not present

## 2024-06-10 DIAGNOSIS — F17200 Nicotine dependence, unspecified, uncomplicated: Secondary | ICD-10-CM | POA: Diagnosis not present

## 2024-07-01 DIAGNOSIS — Z23 Encounter for immunization: Secondary | ICD-10-CM | POA: Diagnosis not present

## 2024-07-17 ENCOUNTER — Ambulatory Visit: Payer: Medicare Other | Admitting: Nurse Practitioner

## 2024-10-21 ENCOUNTER — Ambulatory Visit: Payer: Medicare Other | Admitting: Internal Medicine
